# Patient Record
Sex: Male | Born: 1965 | Race: White | Hispanic: No | Marital: Married | Smoking: Current every day smoker
Health system: Southern US, Community
[De-identification: ages and names within clinical notes are randomized; demographics above are authoritative.]

## PROBLEM LIST (undated history)

## (undated) DIAGNOSIS — C8190 Hodgkin lymphoma, unspecified, unspecified site: Secondary | ICD-10-CM

## (undated) DIAGNOSIS — I447 Left bundle-branch block, unspecified: Secondary | ICD-10-CM

## (undated) DIAGNOSIS — F32A Depression, unspecified: Secondary | ICD-10-CM

## (undated) DIAGNOSIS — R0600 Dyspnea, unspecified: Secondary | ICD-10-CM

## (undated) DIAGNOSIS — M1990 Unspecified osteoarthritis, unspecified site: Secondary | ICD-10-CM

## (undated) DIAGNOSIS — Z9889 Other specified postprocedural states: Secondary | ICD-10-CM

## (undated) DIAGNOSIS — J449 Chronic obstructive pulmonary disease, unspecified: Secondary | ICD-10-CM

## (undated) DIAGNOSIS — F1121 Opioid dependence, in remission: Secondary | ICD-10-CM

## (undated) DIAGNOSIS — F419 Anxiety disorder, unspecified: Secondary | ICD-10-CM

## (undated) DIAGNOSIS — E7800 Pure hypercholesterolemia, unspecified: Secondary | ICD-10-CM

## (undated) DIAGNOSIS — I10 Essential (primary) hypertension: Secondary | ICD-10-CM

## (undated) DIAGNOSIS — J189 Pneumonia, unspecified organism: Secondary | ICD-10-CM

## (undated) DIAGNOSIS — R011 Cardiac murmur, unspecified: Secondary | ICD-10-CM

## (undated) DIAGNOSIS — Z9289 Personal history of other medical treatment: Secondary | ICD-10-CM

## (undated) DIAGNOSIS — F329 Major depressive disorder, single episode, unspecified: Secondary | ICD-10-CM

## (undated) HISTORY — DX: Other specified postprocedural states: Z98.890

## (undated) HISTORY — PX: TOTAL THYROIDECTOMY: SHX2547

## (undated) HISTORY — PX: CARDIAC VALVE REPLACEMENT: SHX585

## (undated) HISTORY — DX: Hodgkin lymphoma, unspecified, unspecified site: C81.90

## (undated) HISTORY — PX: SPLENECTOMY: SUR1306

## (undated) HISTORY — DX: Essential (primary) hypertension: I10

## (undated) HISTORY — DX: Pure hypercholesterolemia, unspecified: E78.00

## (undated) HISTORY — PX: OTHER SURGICAL HISTORY: SHX169

## (undated) HISTORY — PX: AORTIC VALVE REPLACEMENT: SHX41

## (undated) HISTORY — PX: CORONARY ARTERY BYPASS GRAFT: SHX141

## (undated) HISTORY — PX: CARDIAC CATHETERIZATION: SHX172

## (undated) HISTORY — DX: Personal history of other medical treatment: Z92.89

## (undated) HISTORY — DX: Opioid dependence, in remission: F11.21

---
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
## 2016-11-18 DIAGNOSIS — Z9889 Other specified postprocedural states: Secondary | ICD-10-CM | POA: Insufficient documentation

## 2016-11-18 HISTORY — DX: Other specified postprocedural states: Z98.890

## 2018-05-04 DIAGNOSIS — Z8571 Personal history of Hodgkin lymphoma: Secondary | ICD-10-CM

## 2018-07-29 NOTE — Progress Notes (Signed)
Synopsis: Referred in September 2019 for COPD evaluation by Reed Breech, NP  Subjective:   PATIENT ID: Henry Sheppard GENDER: male DOB: 1966-10-29, MRN: [id number]  Chief Complaint  Patient presents with  . Consult    DOE, denies chest pain or discomfort. 2L at night and prn.     Just moved from [place]. He has been diagnosed with COPD a few years ago. He was seeing a pulmonologist in [place]. He is working on quitting smoking. Plans to start chantix with his PCP. Currently established care with [facility].   He had a broken collar bone and went for stress test prior which he failed and led to CABG with Valve replacement. He was able to stop smoking for about 2 years. He had a splenectomy at age 15. Unsure if vaccinated. Does not like getting a flu shot because he always gets sick.   He feels like his activity level is pretty good. On occasion he needs oxygen and feels breathless. Climbing stairs makes this worse. Routinely has phlegm production. He has had 1 hospitalization for COPD that was 3 years ago. He has been walked recently and was found to have hypoxemia. Denies fevers, chills, NS, weight loss.   Has had PFTs in the past. Last chest imaging was in May 2019. (?[place] [facility])    Past Medical History:  Diagnosis Date  . H/O neck surgery   . H/O splenectomy   . Heart valve replaced      Family History  Problem Relation Age of Onset  . Cancer Father   . Lung disease Neg Hx      Past Surgical History:  Procedure Laterality Date  . AORTIC VALVE REPLACEMENT    . CORONARY ARTERY BYPASS GRAFT    . SPLENECTOMY      Social History   Socioeconomic History  . Marital status: Married    Spouse name: Not on file  . Number of children: Not on file  . Years of education: Not on file  . Highest education level: Not on file  Occupational History  . Not on file  Social Needs  . Financial resource strain: Not on file  . Food  insecurity:    Worry: Not on file    Inability: Not on file  . Transportation needs:    Medical: Not on file    Non-medical: Not on file  Tobacco Use  . Smoking status: Current Every Day Smoker  . Smokeless tobacco: Never Used  . Tobacco comment: 2 ppd  Substance and Sexual Activity  . Alcohol use: Not Currently  . Drug use: Not Currently  . Sexual activity: Yes  Lifestyle  . Physical activity:    Days per week: Not on file    Minutes per session: Not on file  . Stress: Not on file  Relationships  . Social connections:    Talks on phone: Not on file    Gets together: Not on file    Attends religious service: Not on file    Active member of club or organization: Not on file    Attends meetings of clubs or organizations: Not on file    Relationship status: Not on file  . Intimate partner violence:    Fear of current or ex partner: Not on file    Emotionally abused: Not on file    Physically abused: Not on file    Forced sexual activity: Not on file  Other Topics Concern  .  Not on file  Social History Narrative  . Not on file     No Known Allergies   Outpatient Medications Prior to Visit  Medication Sig Dispense Refill  . albuterol (PROVENTIL HFA;VENTOLIN HFA) 108 (90 Base) MCG/ACT inhaler Inhale into the lungs.    Marland Kitchen aspirin EC 81 MG tablet Take by mouth.    Marland Kitchen atorvastatin (LIPITOR) 40 MG tablet Take by mouth.    . carvedilol (COREG) 3.125 MG tablet Take by mouth.    . clonazePAM (KLONOPIN) 1 MG tablet Take by mouth.    . fluticasone (FLOVENT HFA) 110 MCG/ACT inhaler Inhale into the lungs.    . furosemide (LASIX) 20 MG tablet TAKE 1 TABLET BY MOUTH EVERY DAY AS NEEDED    . Glycopyrrolate (SEEBRI NEOHALER) 15.6 MCG CAPS Place 1 puff into inhaler and inhale daily as needed.    Marland Kitchen levothyroxine (SYNTHROID, LEVOTHROID) 200 MCG tablet Take by mouth.    . losartan (COZAAR) 25 MG tablet Take by mouth.    . sertraline (ZOLOFT) 100 MG tablet Take by mouth.    . SUBOXONE 8-2 MG  FILM PLACE 1 FILM UNDER TONGUE 2 TIMES DAILY  0  . Tiotropium Bromide Monohydrate 2.5 MCG/ACT AERS Inhale into the lungs.     No facility-administered medications prior to visit.     Review of Systems  Constitutional: Negative.   HENT: Positive for congestion and sore throat.   Eyes: Positive for blurred vision.  Respiratory: Positive for cough, hemoptysis, shortness of breath and wheezing.   Cardiovascular: Positive for leg swelling.  Gastrointestinal: Negative.   Genitourinary: Negative.   Musculoskeletal: Negative.   Skin: Positive for itching.  Neurological: Positive for headaches.  Endo/Heme/Allergies: Negative.   Psychiatric/Behavioral: Negative.      Objective:  Physical Exam  Constitutional: He is oriented to person, place, and time. He appears well-developed and well-nourished. No distress.  HENT:  Head: Normocephalic and atraumatic.  Mouth/Throat: Oropharynx is clear and moist.  Eyes: Pupils are equal, round, and reactive to light. Conjunctivae are normal. No scleral icterus.  Neck: Neck supple. No JVD present. No tracheal deviation present.  Cardiovascular: Normal rate, regular rhythm, normal heart sounds and intact distal pulses.  No murmur heard. Pulmonary/Chest: Effort normal and breath sounds normal. No accessory muscle usage or stridor. No tachypnea. No respiratory distress. He has no wheezes. He has no rhonchi. He has no rales.  Abdominal: Soft. Bowel sounds are normal. He exhibits no distension. There is no tenderness.  Musculoskeletal: He exhibits no edema or tenderness.  Lymphadenopathy:    He has no cervical adenopathy.  Neurological: He is alert and oriented to person, place, and time.  Skin: Skin is warm and dry. Capillary refill takes less than 2 seconds. No rash noted.  Psychiatric: He has a normal mood and affect. His behavior is normal.  Vitals reviewed.   Vitals:   07/30/18 1136  BP: 128/82  Pulse: 77  SpO2: 94%  Weight: 225 lb (102.1 kg)    Height: 5\' 9"  (1.753 m)   94% on RA BMI Readings from Last 3 Encounters:  07/30/18 33.23 kg/m   Wt Readings from Last 3 Encounters:  07/30/18 225 lb (102.1 kg)   CBC No results found for: WBC, RBC, HGB, HCT, PLT, MCV, MCH, MCHC, RDW, LYMPHSABS, MONOABS, EOSABS, BASOSABS  No available labs to review  Chest Imaging: Most recent imaging was completed and May 2019 in [facility] [place] [facility] [facility]. Will request medical records.  Pulmonary Functions Testing Results: No  results found for: FEV1, FVC, FEV1FVC, TLC, DLCO  None available to review.  Patient is unsure when his last PFTs were completed.  FeNO: None  Pathology: None  Echocardiogram: None  Heart Catheterization: None to review has had this in the.    Assessment & Plan:   Chronic obstructive pulmonary disease, unspecified COPD type ([place]) - Plan: Pulmonary function test  Current smoker  Hx of Hodgkin's lymphoma  History of coronary artery bypass graft  History of thyroidectomy  History of splenectomy  Discussion:  This is a 52 year old gentleman with multiple medical problems.  History of coronary artery bypass grafting as well as valve replacement, aortic valve.  History of Hodgkin's lymphoma at the age of 17 status post chemo and chest radiation and neck radiation.  Since then has had thyroidectomy as well.  He is a longtime smoker, current smoker 2ppd.  We will obtain full PFTs Start samples of trilogy inhaler. Will send prescription to pharmacy. Discontinue tiotropium, glycopyrrolate, fluticasone  Continue albuterol inhaler as needed for shortness of breath and wheezing. Encourage smoking cessation.  I am glad you currently have a plan to work with this with your primary care provider.  Please let us know if we can be of any help. Walk today in the office to see if you qualify for continued oxygen use.  Return to clinic in 4 months or sooner if symptoms worsen.    Current Outpatient Medications:  .   albuterol (PROVENTIL HFA;VENTOLIN HFA) 108 (90 Base) MCG/ACT inhaler, Inhale into the lungs., Disp: , Rfl:  .  aspirin EC 81 MG tablet, Take by mouth., Disp: , Rfl:  .  atorvastatin (LIPITOR) 40 MG tablet, Take by mouth., Disp: , Rfl:  .  carvedilol (COREG) 3.125 MG tablet, Take by mouth., Disp: , Rfl:  .  clonazePAM (KLONOPIN) 1 MG tablet, Take by mouth., Disp: , Rfl:  .  fluticasone (FLOVENT HFA) 110 MCG/ACT inhaler, Inhale into the lungs., Disp: , Rfl:  .  furosemide (LASIX) 20 MG tablet, TAKE 1 TABLET BY MOUTH EVERY DAY AS NEEDED, Disp: , Rfl:  .  Glycopyrrolate (SEEBRI NEOHALER) 15.6 MCG CAPS, Place 1 puff into inhaler and inhale daily as needed., Disp: , Rfl:  .  levothyroxine (SYNTHROID, LEVOTHROID) 200 MCG tablet, Take by mouth., Disp: , Rfl:  .  losartan (COZAAR) 25 MG tablet, Take by mouth., Disp: , Rfl:  .  sertraline (ZOLOFT) 100 MG tablet, Take by mouth., Disp: , Rfl:  .  SUBOXONE 8-2 MG FILM, PLACE 1 FILM UNDER TONGUE 2 TIMES DAILY, Disp: , Rfl: 0 .  Tiotropium Bromide Monohydrate 2.5 MCG/ACT AERS, Inhale into the lungs., Disp: , Rfl:    Garner Nash, DO [facility] 07/30/2018 12:25 PM

## 2018-07-30 ENCOUNTER — Encounter: Payer: Self-pay | Admitting: Pulmonary Disease

## 2018-07-30 ENCOUNTER — Ambulatory Visit (INDEPENDENT_AMBULATORY_CARE_PROVIDER_SITE_OTHER): Payer: Medicare Other | Admitting: Pulmonary Disease

## 2018-07-30 VITALS — BP 128/82 | HR 77 | Ht 69.0 in | Wt 225.0 lb

## 2018-07-30 DIAGNOSIS — Z8571 Personal history of Hodgkin lymphoma: Secondary | ICD-10-CM | POA: Diagnosis not present

## 2018-07-30 DIAGNOSIS — E890 Postprocedural hypothyroidism: Secondary | ICD-10-CM

## 2018-07-30 DIAGNOSIS — J449 Chronic obstructive pulmonary disease, unspecified: Secondary | ICD-10-CM

## 2018-07-30 DIAGNOSIS — F17200 Nicotine dependence, unspecified, uncomplicated: Secondary | ICD-10-CM

## 2018-07-30 DIAGNOSIS — E7800 Pure hypercholesterolemia, unspecified: Secondary | ICD-10-CM | POA: Insufficient documentation

## 2018-07-30 DIAGNOSIS — Z9889 Other specified postprocedural states: Secondary | ICD-10-CM

## 2018-07-30 DIAGNOSIS — I5020 Unspecified systolic (congestive) heart failure: Secondary | ICD-10-CM | POA: Insufficient documentation

## 2018-07-30 DIAGNOSIS — Z9081 Acquired absence of spleen: Secondary | ICD-10-CM

## 2018-07-30 DIAGNOSIS — I2729 Other secondary pulmonary hypertension: Secondary | ICD-10-CM | POA: Insufficient documentation

## 2018-07-30 DIAGNOSIS — Z951 Presence of aortocoronary bypass graft: Secondary | ICD-10-CM | POA: Diagnosis not present

## 2018-07-30 DIAGNOSIS — Z9009 Acquired absence of other part of head and neck: Secondary | ICD-10-CM

## 2018-07-30 DIAGNOSIS — I509 Heart failure, unspecified: Secondary | ICD-10-CM

## 2018-07-30 MED ORDER — FLUTICASONE-UMECLIDIN-VILANT 100-62.5-25 MCG/INH IN AEPB: 1.0000 | INHALATION_SPRAY | Freq: Every day | RESPIRATORY_TRACT | 3 refills | Status: DC

## 2018-07-30 NOTE — Addendum Note (Signed)
Addended by: Vivia Ewing on: 07/30/2018 12:30 PM   Modules accepted: Orders

## 2018-07-30 NOTE — Patient Instructions (Addendum)
We will obtain full PFTs Start samples of trilogy inhaler. Will send prescription to pharmacy. Discontinue tiotropium, glycopyrrolate, fluticasone  Continue albuterol inhaler as needed for shortness of breath and wheezing. Encourage smoking cessation.  I am glad you currently have a plan to work with this with your primary care provider.  Please let us know if we can be of any help. Return to clinic in 4 months or sooner if symptoms worsen.

## 2018-07-31 ENCOUNTER — Telehealth: Payer: Self-pay | Admitting: Pulmonary Disease

## 2018-07-31 NOTE — Telephone Encounter (Signed)
Medication name and strength: Trelegy Ellipta 100-6.5-25 Provider: [facility] Pharmacy: [facility] Patient insurance ID:  Phone: [phone] Fax: [phone]  Was the PA started on CMM?  no If yes, please enter the Key:  Timeframe for approval/denial: approval   [place] [phone]  Approval Number: [id number]  Exp;07/26/2019  Pharmacy is aware of approval. Nothing more needed at this time.

## 2018-10-07 DIAGNOSIS — Z9289 Personal history of other medical treatment: Secondary | ICD-10-CM

## 2018-10-07 HISTORY — DX: Personal history of other medical treatment: Z92.89

## 2018-12-21 DIAGNOSIS — Z952 Presence of prosthetic heart valve: Secondary | ICD-10-CM

## 2018-12-21 DIAGNOSIS — R0609 Other forms of dyspnea: Secondary | ICD-10-CM

## 2018-12-21 DIAGNOSIS — F1120 Opioid dependence, uncomplicated: Secondary | ICD-10-CM | POA: Diagnosis present

## 2018-12-21 DIAGNOSIS — I10 Essential (primary) hypertension: Secondary | ICD-10-CM | POA: Diagnosis present

## 2018-12-21 NOTE — Progress Notes (Signed)
Labs 12/14/2018: Glucose 111. BUN/Cr 18/0.67. eGFR normal. Na/K 138/4.7 H/H 14/42. MCV 89. Platelets 291.

## 2018-12-21 NOTE — H&P (Signed)
Henry Sheppard 12/14/2018 10:45 AM Location: [facility] Patient #: [id number] DOB: 06/01/1966 Married / Language: English / Race: White Male   History of Present Illness ( J.  MD; 12/14/2018 11:19 AM) Patient words: 3 month f/u for sob and echo results; Last OV 09/14/2018 pt still has swelling in ankles and feet.  The patient is a 52 year old male, accompanied by his mother (Barabara McLaren) and wife (Rachel) during the visit, who presents for a Follow-up for Shortness of breath. 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  There is confusing informaiton regarding coronary artery disease on this patient. Office visits form [place] mention 3 V CABG in surgical history. However, cath report from 2016 after bioprosthetic valve placement reports nonobastructive CAD with no mention of bypass grafts.  Patient is here with his wife, Rachel and mother, Barbara today. Patient has had worsening exertional dyspnea and leg swelling. He is barely able to walk from car to the office today, without shortness of breath.  Additional reasons for visit:  Follow-up for Aortic valve replacement    Problem List/Past Medical (Shayna Leathers; 12/14/2018 10:43 AM) Laboratory examination (Z01.89)  Labs 09/17/2018: H/H 16/48. MCV 90. Platelets 257. Glucose 103. BUN/Cr 17/0.69. eGFR normal. Na/K 140/4.9. Rest of the CMP nromal. Chol 214, TG 119, HDL 43, LDL 147 Hodgkin's Disease  Coronary arteriosclerosis (I25.10)  Hyperlipidemia, group A (E78.00)  Opioid dependence in remission (F11.21)  S/P AVR (aortic valve replacement) and aortoplasty (Z95.2)  Hypertension, benign (I10)  EKG 06/10/2018: Sinus rhythm 73 bpm. Left bundle branch block. Exertional  dyspnea (R06.09)   Allergies (Shayna Picardi; 12/14/2018 10:43 AM) No Known Drug Allergies [06/10/2018]:  Family History (Shayna Bensch; 12/14/2018 10:43 AM) Mother  In poor health. No heart issues; Father  unsure Brother 3  2 younger; 1 older; 1 passed; No heart issues  Social History (Shayna Morina; 12/14/2018 10:43 AM) Current tobacco use  Current every day smoker. smoking since 1985- smokes 1 1/2 packs daily Alcohol Use  Occasional alcohol use. medical marijuana patient Marital status  Married. Living Situation  Lives with spouse. Number of Children  3.  Past Surgical History (Shayna Bralley; 12/14/2018 10:43 AM) Lymph angiogram [1984]: Spleen  removed; Thyroidectomy; Total  Collar bone  R  Medication History (Shayna Chaudoin; 12/14/2018 10:51 AM) Albuterol Sulfate HFA (108 (90 Base)MCG/ACT Aerosol Soln, Inhalation daily) Active. Aspirin (81MG Capsule, 1 Oral daily) Active. Atorvastatin Calcium (40MG Tablet, 1 Oral daily) Active. Buprenorphine HCl-Naloxone HCl (8-2MG Film, Sublingual two times daily) Active. Carvedilol (6.25MG Tablet, 1 Oral daily) Active. clonazePAM (1MG Tablet, 1 Oral daily) Active. Cyclobenzaprine HCl (10MG Tablet, 1 Oral daily) Active. Docusate Sodium (100MG Capsule, Oral as needed) Active. Furosemide (20MG Tablet, Oral Once daily) Active. Levothyroxine Sodium (200MCG Tablet, 1 Oral daily) Active. Oxygen-Helium (20-80% Gas, Inhalation as needed) Active. Sertraline HCl (100MG Tablet, 1 Oral daily) Active. Spiriva Respimat (2.5MCG/ACT Aerosol Soln, Inhalation daily) Active. Medications Reconciled (verbally)  Diagnostic Studies History (Shayna Bober; 12/14/2018 10:45 AM) Colonoscopy [2018]: Normal. Endoscopy [2018]: Echocardiogram  10/07/2018 1. Left ventricle cavity is normal in size. Moderate concentric hypertrophy of the left ventricle. Mildly reduced syst. function Visual EF is 45-50%. Abnormal septal wall motion due  to post-operative valve. Indeterminate diastolic filling pattern. 2. Left atrial cavity is moderately dilated. 3. Bio prosthetic aortic valve. Moderate to severe aortic valve stenosis   of bio prosthetic aortic valve with peak gradient 52, mean gradient 30 mm of Hg. Calculated valve area is 0.66 cm2 4. Trace mitral regurgitation. Moderate to severe calcification of the mitral valve annulus. 5. Mild tricuspid regurgitation. Mild pulmonary hypertension with approx. PA syst. pressure of 32 mm of Hg. 6. IVC is dilated with respiratory variation, suggests elevated RA pressure    Review of Systems ( J. , MD; 12/14/2018 11:26 AM) General Not Present- Appetite Loss and Weight Gain. Respiratory Not Present- Chronic Cough and Wakes up from Sleep Wheezing or Short of Breath. Cardiovascular Present- Shortness of Breath. Not Present- Chest Pain, Fainting and Palpitations. Gastrointestinal Not Present- Black, Tarry Stool and Difficulty Swallowing. Musculoskeletal Not Present- Decreased Range of Motion and Muscle Atrophy. Neurological Not Present- Attention Deficit. Psychiatric Not Present- Personality Changes and Suicidal Ideation. Endocrine Not Present- Cold Intolerance and Heat Intolerance. Hematology Not Present- Abnormal Bleeding. All other systems negative  Vitals (Shayna Liles; 12/14/2018 10:53 AM) 12/14/2018 10:47 AM Weight: 244.13 lb Height: 72in Body Surface Area: 2.32 m Body Mass Index: 33.11 kg/m  Pulse: 71 (Regular)  P.OX: 95% (Room air) BP: 148/74 (Sitting, Right Arm, Standard)       Physical Exam ( J.  MD; 12/14/2018 11:17 AM) General Mental Status-Alert. General Appearance-Cooperative and Appears stated age. Build & Nutrition-Moderately built.  Head and Neck Thyroid Gland Characteristics - normal size and consistency and no palpable nodules. Note: Indiscrete right submandibular lymph node   Chest and Lung Exam Chest  and lung exam reveals -quiet, even and easy respiratory effort with no use of accessory muscles, non-tender and on auscultation, normal breath sounds, no adventitious sounds.  Cardiovascular Cardiovascular examination reveals -carotid auscultation reveals no bruits, abdominal aorta auscultation reveals no bruits and no prominent pulsation and femoral artery auscultation bilaterally reveals normal pulses, no bruits, no thrills. Auscultation Rhythm - Regular. Murmurs & Other Heart Sounds: Murmur - Location - Aortic Area. Timing - Early systolic. Grade - III/VI. Character - Crescendo/Decrescendo. Note: Sternotomy scar   Abdomen Palpation/Percussion Normal exam - Non Tender and No hepatosplenomegaly.  Peripheral Vascular Lower Extremity Palpation - Edema - Bilateral - 1+ Pitting edema. Dorsalis pedis pulse - Bilateral - 2+. Posterior tibial pulse - Bilateral - 1+. Carotid arteries - Bilateral-Soft Bruit.  Neurologic Neurologic evaluation reveals -alert and oriented x 3 with no impairment of recent or remote memory. Motor-Grossly intact without any focal deficits.  Musculoskeletal Global Assessment Right Lower Extremity - no deformities, masses or tenderness, no known fractures. Head and Neck - Note: Deformities of neck, prior clavicle fracture.    Assessment & Plan ( J.  MD; 12/14/2018 11:26 AM) Exertional dyspnea (R06.09) Hypertension, benign (I10) Story: EKG 06/10/2018: Sinus rhythm 73 bpm. Left bundle branch block. 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  I will obtain operative note to confirm that the patient   does not have CABG. I will then perform a TEE and right and left heart cath with coronary angiography, anticipating either redo surgery or valve-in-valve TAVR. I will then have him see Dr. Cyndia Bent.  No changes made to his medications today.  I suspect he may have at least moderate stenosis of his by prosthetic aortic valve. Etiology of his initial aortic stenosis remains unclear. I suspect he may have had a cuspid aortic valve that led to premature aortic stenosis. I will obtain operative report from 2011. I recommend repeat transthoracic echocardiogram. He may need TEE for further evaluation. He is unwilling to undergo redo aortic valve replacement surgery. Should he have severe back for severe divorce process, valve and valve procedure may be an option. For now, I recommend him to use Lasix 20 mg every day. Continue aspirin 81 mg daily.  History of Hodgkin's lymphoma: Patient reportedly has established care with an oncologist, but does not recollect the name. Reportedly in remission.  COPD: Follow up with pulmonology.  H/o opioid dependence: Follow up with [facility] clinic.  Cc Thermon Leyland, NP Cc Gilford Raid, MD  Signed electronically by Nigel Mormon, MD (12/14/2018 11:26 AM)

## 2018-12-22 ENCOUNTER — Ambulatory Visit (HOSPITAL_COMMUNITY)
Admission: RE | Admit: 2018-12-22 | Discharge: 2018-12-22 | Disposition: A | Discharge status: Home / Self Care | Payer: Medicare Other | Attending: Cardiology | Admitting: Cardiology

## 2018-12-22 ENCOUNTER — Encounter (HOSPITAL_COMMUNITY)
Admission: RE | Disposition: A | Discharge status: Home / Self Care | Payer: Self-pay | Source: Home / Self Care | Attending: Cardiology

## 2018-12-22 ENCOUNTER — Other Ambulatory Visit: Payer: Self-pay

## 2018-12-22 ENCOUNTER — Encounter (HOSPITAL_COMMUNITY): Payer: Self-pay | Admitting: *Deleted

## 2018-12-22 ENCOUNTER — Ambulatory Visit (HOSPITAL_COMMUNITY): Payer: Medicare Other

## 2018-12-22 DIAGNOSIS — I350 Nonrheumatic aortic (valve) stenosis: Secondary | ICD-10-CM

## 2018-12-22 DIAGNOSIS — I10 Essential (primary) hypertension: Secondary | ICD-10-CM | POA: Diagnosis not present

## 2018-12-22 DIAGNOSIS — Z8571 Personal history of Hodgkin lymphoma: Secondary | ICD-10-CM | POA: Insufficient documentation

## 2018-12-22 DIAGNOSIS — T82857A Stenosis of cardiac prosthetic devices, implants and grafts, initial encounter: Secondary | ICD-10-CM | POA: Diagnosis present

## 2018-12-22 DIAGNOSIS — Z79890 Hormone replacement therapy (postmenopausal): Secondary | ICD-10-CM | POA: Insufficient documentation

## 2018-12-22 DIAGNOSIS — Z9981 Dependence on supplemental oxygen: Secondary | ICD-10-CM | POA: Insufficient documentation

## 2018-12-22 DIAGNOSIS — I272 Pulmonary hypertension, unspecified: Secondary | ICD-10-CM | POA: Insufficient documentation

## 2018-12-22 DIAGNOSIS — Z952 Presence of prosthetic heart valve: Secondary | ICD-10-CM | POA: Diagnosis not present

## 2018-12-22 DIAGNOSIS — Z87891 Personal history of nicotine dependence: Secondary | ICD-10-CM | POA: Diagnosis not present

## 2018-12-22 DIAGNOSIS — R0609 Other forms of dyspnea: Secondary | ICD-10-CM | POA: Diagnosis present

## 2018-12-22 DIAGNOSIS — I082 Rheumatic disorders of both aortic and tricuspid valves: Secondary | ICD-10-CM | POA: Insufficient documentation

## 2018-12-22 DIAGNOSIS — F1120 Opioid dependence, uncomplicated: Secondary | ICD-10-CM | POA: Diagnosis present

## 2018-12-22 DIAGNOSIS — E7800 Pure hypercholesterolemia, unspecified: Secondary | ICD-10-CM | POA: Diagnosis not present

## 2018-12-22 DIAGNOSIS — I2510 Atherosclerotic heart disease of native coronary artery without angina pectoris: Secondary | ICD-10-CM | POA: Insufficient documentation

## 2018-12-22 DIAGNOSIS — Z7982 Long term (current) use of aspirin: Secondary | ICD-10-CM | POA: Insufficient documentation

## 2018-12-22 HISTORY — PX: TEE WITHOUT CARDIOVERSION: SHX5443

## 2018-12-22 HISTORY — PX: RIGHT/LEFT HEART CATH AND CORONARY ANGIOGRAPHY: CATH118266

## 2018-12-22 LAB — POCT I-STAT 7, (LYTES, BLD GAS, ICA,H+H)
ACID-BASE EXCESS: 2 mmol/L (ref 0.0–2.0)
Bicarbonate: 28 mmol/L (ref 20.0–28.0)
Calcium, Ion: 1.12 mmol/L — ABNORMAL LOW (ref 1.15–1.40)
HEMATOCRIT: 40 % (ref 39.0–52.0)
Hemoglobin: 13.6 g/dL (ref 13.0–17.0)
O2 Saturation: 92 %
Potassium: 4 mmol/L (ref 3.5–5.1)
SODIUM: 139 mmol/L (ref 135–145)
TCO2: 29 mmol/L (ref 22–32)
pCO2 arterial: 47.8 mmHg (ref 32.0–48.0)
pH, Arterial: 7.376 (ref 7.350–7.450)
pO2, Arterial: 66 mmHg — ABNORMAL LOW (ref 83.0–108.0)

## 2018-12-22 LAB — POCT I-STAT EG7
Acid-Base Excess: 3 mmol/L — ABNORMAL HIGH (ref 0.0–2.0)
Bicarbonate: 30.7 mmol/L — ABNORMAL HIGH (ref 20.0–28.0)
Calcium, Ion: 1.19 mmol/L (ref 1.15–1.40)
HCT: 41 % (ref 39.0–52.0)
Hemoglobin: 13.9 g/dL (ref 13.0–17.0)
O2 SAT: 65 %
Potassium: 4.1 mmol/L (ref 3.5–5.1)
Sodium: 139 mmol/L (ref 135–145)
TCO2: 32 mmol/L (ref 22–32)
pCO2, Ven: 57.1 mmHg (ref 44.0–60.0)
pH, Ven: 7.339 (ref 7.250–7.430)
pO2, Ven: 37 mmHg (ref 32.0–45.0)

## 2018-12-22 SURGERY — ECHOCARDIOGRAM, TRANSESOPHAGEAL
Anesthesia: Moderate Sedation

## 2018-12-22 SURGERY — RIGHT/LEFT HEART CATH AND CORONARY ANGIOGRAPHY
Anesthesia: LOCAL

## 2018-12-22 MED ORDER — SODIUM CHLORIDE 0.9 % WEIGHT BASED INFUSION: 1.0000 mL/kg/h | INTRAVENOUS | Status: DC

## 2018-12-22 MED ORDER — FENTANYL CITRATE (PF) 100 MCG/2ML IJ SOLN
INTRAMUSCULAR | Status: DC | PRN
  Administered 2018-12-22 (×2): 25 ug via INTRAVENOUS

## 2018-12-22 MED ORDER — LIDOCAINE HCL (PF) 1 % IJ SOLN
INTRAMUSCULAR | Status: DC | PRN
  Administered 2018-12-22 (×2): 2 mL via INTRADERMAL

## 2018-12-22 MED ORDER — HEPARIN (PORCINE) IN NACL 1000-0.9 UT/500ML-% IV SOLN
INTRAVENOUS | Status: DC | PRN
  Administered 2018-12-22 (×2): 500 mL

## 2018-12-22 MED ORDER — VERAPAMIL HCL 2.5 MG/ML IV SOLN
INTRAVENOUS | Status: AC
  Filled 2018-12-22: qty 2

## 2018-12-22 MED ORDER — FUROSEMIDE 40 MG PO TABS: 40.0000 mg | ORAL_TABLET | Freq: Every day | ORAL | 2 refills | Status: DC

## 2018-12-22 MED ORDER — SODIUM CHLORIDE 0.9% FLUSH: 3.0000 mL | Freq: Two times a day (BID) | INTRAVENOUS | Status: DC

## 2018-12-22 MED ORDER — HEPARIN SODIUM (PORCINE) 1000 UNIT/ML IJ SOLN
INTRAMUSCULAR | Status: AC
  Filled 2018-12-22: qty 1

## 2018-12-22 MED ORDER — MIDAZOLAM HCL 5 MG/5ML IJ SOLN
INTRAMUSCULAR | Status: DC | PRN
  Administered 2018-12-22 (×2): 1 mg via INTRAVENOUS

## 2018-12-22 MED ORDER — BUTAMBEN-TETRACAINE-BENZOCAINE 2-2-14 % EX AERO
INHALATION_SPRAY | CUTANEOUS | Status: AC
  Filled 2018-12-22: qty 20

## 2018-12-22 MED ORDER — SODIUM CHLORIDE 0.9% FLUSH: 3.0000 mL | INTRAVENOUS | Status: DC | PRN

## 2018-12-22 MED ORDER — ONDANSETRON HCL 4 MG/2ML IJ SOLN: 4.0000 mg | Freq: Four times a day (QID) | INTRAMUSCULAR | Status: DC | PRN

## 2018-12-22 MED ORDER — SODIUM CHLORIDE 0.9 % IV SOLN: 250.0000 mL | INTRAVENOUS | Status: DC | PRN

## 2018-12-22 MED ORDER — LIDOCAINE HCL (PF) 1 % IJ SOLN
INTRAMUSCULAR | Status: AC
  Filled 2018-12-22: qty 30

## 2018-12-22 MED ORDER — VERAPAMIL HCL 2.5 MG/ML IV SOLN
INTRAVENOUS | Status: DC | PRN
  Administered 2018-12-22: 5 mL via INTRA_ARTERIAL

## 2018-12-22 MED ORDER — SODIUM CHLORIDE 0.9 % IV SOLN: INTRAVENOUS | Status: DC

## 2018-12-22 MED ORDER — ASPIRIN 81 MG PO CHEW
81.0000 mg | CHEWABLE_TABLET | ORAL | Status: AC
  Administered 2018-12-22: 81 mg via ORAL
  Filled 2018-12-22: qty 1

## 2018-12-22 MED ORDER — IOHEXOL 350 MG/ML SOLN
INTRAVENOUS | Status: DC | PRN
  Administered 2018-12-22: 60 mL via INTRA_ARTERIAL

## 2018-12-22 MED ORDER — MIDAZOLAM HCL (PF) 5 MG/ML IJ SOLN
INTRAMUSCULAR | Status: AC
  Filled 2018-12-22: qty 2

## 2018-12-22 MED ORDER — BUTAMBEN-TETRACAINE-BENZOCAINE 2-2-14 % EX AERO
INHALATION_SPRAY | CUTANEOUS | Status: DC | PRN
  Administered 2018-12-22: 2 via TOPICAL

## 2018-12-22 MED ORDER — FUROSEMIDE 10 MG/ML IJ SOLN: 40.0000 mg | Freq: Once | INTRAMUSCULAR | Status: DC

## 2018-12-22 MED ORDER — HEPARIN (PORCINE) IN NACL 1000-0.9 UT/500ML-% IV SOLN
INTRAVENOUS | Status: AC
  Filled 2018-12-22: qty 1000

## 2018-12-22 MED ORDER — FENTANYL CITRATE (PF) 100 MCG/2ML IJ SOLN
INTRAMUSCULAR | Status: AC
  Filled 2018-12-22: qty 2

## 2018-12-22 MED ORDER — SODIUM CHLORIDE 0.9 % WEIGHT BASED INFUSION
3.0000 mL/kg/h | INTRAVENOUS | Status: AC
  Administered 2018-12-22: 3 mL/kg/h via INTRAVENOUS

## 2018-12-22 MED ORDER — HEPARIN SODIUM (PORCINE) 1000 UNIT/ML IJ SOLN
INTRAMUSCULAR | Status: DC | PRN
  Administered 2018-12-22: 7000 [IU] via INTRAVENOUS

## 2018-12-22 MED ORDER — ACETAMINOPHEN 325 MG PO TABS: 650.0000 mg | ORAL_TABLET | ORAL | Status: DC | PRN

## 2018-12-22 SURGICAL SUPPLY — 13 items
CATH BALLN WEDGE 5F 110CM (CATHETERS) ×2 IMPLANT
CATH INFINITI 5 FR JL3.5 (CATHETERS) ×2 IMPLANT
CATH INFINITI JR4 5F (CATHETERS) ×2 IMPLANT
CATH LANGSTON DUAL LUM PIG 6FR (CATHETERS) ×2 IMPLANT
DEVICE RAD COMP TR BAND LRG (VASCULAR PRODUCTS) ×2 IMPLANT
GLIDESHEATH SLEND A-KIT 6F 22G (SHEATH) ×2 IMPLANT
GUIDEWIRE INQWIRE 1.5J.035X260 (WIRE) ×1 IMPLANT
INQWIRE 1.5J .035X260CM (WIRE) ×2
KIT HEART LEFT (KITS) ×2 IMPLANT
PACK CARDIAC CATHETERIZATION (CUSTOM PROCEDURE TRAY) ×2 IMPLANT
SHEATH GLIDE SLENDER 4/5FR (SHEATH) ×2 IMPLANT
TRANSDUCER W/STOPCOCK (MISCELLANEOUS) ×2 IMPLANT
TUBING CIL FLEX 10 FLL-RA (TUBING) ×2 IMPLANT

## 2018-12-22 NOTE — Progress Notes (Signed)
  Echocardiogram Echocardiogram Transesophageal has been performed.  Henry Sheppard 12/22/2018, 9:11 AM

## 2018-12-22 NOTE — Discharge Instructions (Signed)
Radial Site Care ° °This sheet gives you information about how to care for yourself after your procedure. Your health care provider may also give you more specific instructions. If you have problems or questions, contact your health care provider. °What can I expect after the procedure? °After the procedure, it is common to have: °· Bruising and tenderness at the catheter insertion area. °Follow these instructions at home: °Medicines °· Take over-the-counter and prescription medicines only as told by your health care provider. °Insertion site care °· Follow instructions from your health care provider about how to take care of your insertion site. Make sure you: °? Wash your hands with soap and water before you change your bandage (dressing). If soap and water are not available, use hand sanitizer. °? Change your dressing as told by your health care provider. °? Leave stitches (sutures), skin glue, or adhesive strips in place. These skin closures may need to stay in place for 2 weeks or longer. If adhesive strip edges start to loosen and curl up, you may trim the loose edges. Do not remove adhesive strips completely unless your health care provider tells you to do that. °· Check your insertion site every day for signs of infection. Check for: °? Redness, swelling, or pain. °? Fluid or blood. °? Pus or a bad smell. °? Warmth. °· Do not take baths, swim, or use a hot tub until your health care provider approves. °· You may shower 24-48 hours after the procedure, or as directed by your health care provider. °? Remove the dressing and gently wash the site with plain soap and water. °? Pat the area dry with a clean towel. °? Do not rub the site. That could cause bleeding. °· Do not apply powder or lotion to the site. °Activity ° °· For 24 hours after the procedure, or as directed by your health care provider: °? Do not flex or bend the affected arm. °? Do not push or pull heavy objects with the affected arm. °? Do not  drive yourself home from the hospital or clinic. You may drive 24 hours after the procedure unless your health care provider tells you not to. °? Do not operate machinery or power tools. °· Do not lift anything that is heavier than 10 lb (4.5 kg), or the limit that you are told, until your health care provider says that it is safe. °· Ask your health care provider when it is okay to: °? Return to work or school. °? Resume usual physical activities or sports. °? Resume sexual activity. °General instructions °· If the catheter site starts to bleed, raise your arm and put firm pressure on the site. If the bleeding does not stop, get help right away. This is a medical emergency. °· If you went home on the same day as your procedure, a responsible adult should be with you for the first 24 hours after you arrive home. °· Keep all follow-up visits as told by your health care provider. This is important. °Contact a health care provider if: °· You have a fever. °· You have redness, swelling, or yellow drainage around your insertion site. °Get help right away if: °· You have unusual pain at the radial site. °· The catheter insertion area swells very fast. °· The insertion area is bleeding, and the bleeding does not stop when you hold steady pressure on the area. °· Your arm or hand becomes pale, cool, tingly, or numb. °These symptoms may represent a serious problem   that is an emergency. Do not wait to see if the symptoms will go away. Get medical help right away. Call your local emergency services (911 in the [place] Do not drive yourself to the hospital. °Summary °· After the procedure, it is common to have bruising and tenderness at the site. °· Follow instructions from your health care provider about how to take care of your radial site wound. Check the wound every day for signs of infection. °· Do not lift anything that is heavier than 10 lb (4.5 kg), or the limit that you are told, until your health care provider says  that it is safe. °This information is not intended to replace advice given to you by your health care provider. Make sure you discuss any questions you have with your health care provider. °Document Released: 12/07/2010 Document Revised: 12/10/2017 Document Reviewed: 12/10/2017 °Elsevier Interactive Patient Education © 2019 [employer] ° °

## 2018-12-22 NOTE — Interval H&P Note (Signed)
History and Physical Interval Note:  12/22/2018 8:04 AM  Henry Sheppard  has presented today for surgery, with the diagnosis of prosthetic valve stenosis  The various methods of treatment have been discussed with the patient and family. After consideration of risks, benefits and other options for treatment, the patient has consented to  Procedure(s): TRANSESOPHAGEAL ECHOCARDIOGRAM (TEE) (N/A) as a surgical intervention .  The patient's history has been reviewed, patient examined, no change in status, stable for surgery.  I have reviewed the patient's chart and labs.  Questions were answered to the patient's satisfaction.     [place]

## 2018-12-22 NOTE — CV Procedure (Signed)
TEE: Under moderate sedation, TEE was performed without complications: LV: Normal. Normal EF. RV: Normal LA: Normal. Left atrial appendage: Normal without thrombus. Normal function. Inter atrial septum is intact without defect. RA: Normal  MV: Normal Mild MR. 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 No AI. PV: Normal. Trace PI.  Thoracic and ascending aorta: Normal without significant plaque or atheromatous changes.  Conscious sedation protocol was followed, I personally administered conscious sedation and monitored the patient. Patient received 2 milligrams of Versed and 50 . Patient tolerated the procedure well and there was no complication from conscious sedation. Time administered was 30 min and procedure ended at 8:45 AM.

## 2018-12-22 NOTE — Progress Notes (Signed)
Endo recovery complete following TEE. Patient remains in endo at this time awaiting Cath lab.

## 2018-12-22 NOTE — Interval H&P Note (Signed)
History and Physical Interval Note:  12/22/2018 11:43 AM  Henry Sheppard  has presented today for surgery, with the diagnosis of stenosis  The various methods of treatment have been discussed with the patient and family. After consideration of risks, benefits and other options for treatment, the patient has consented to  Procedure(s): RIGHT/LEFT HEART CATH AND CORONARY ANGIOGRAPHY (N/A) as a surgical intervention .  The patient's history has been reviewed, patient examined, no change in status, stable for surgery.  I have reviewed the patient's chart and labs.  Questions were answered to the patient's satisfaction.    2012 Appropriate Use Criteria for Diagnostic Catheterization Home / Select Test of Interest Indication for RHC Valvular Disease Valvular Disease (Right and Left Heart Catheterization or Right Heart Catheterization Alone With or  Valvular Disease  (Right and Left Heart Catheterization or Right Heart Catheterization  Alone With or Without Left Ventriculography and Coronary Angiography) Link Here: [email or web address] Indication:  1. Preoperative assessment before valvular surgery A (7) Indication: 70; Score 7     Esker Dever J Caileigh Canche

## 2018-12-23 ENCOUNTER — Encounter (HOSPITAL_COMMUNITY): Payer: Self-pay | Admitting: Cardiology

## 2018-12-28 ENCOUNTER — Other Ambulatory Visit: Payer: Self-pay | Admitting: *Deleted

## 2018-12-28 ENCOUNTER — Institutional Professional Consult (permissible substitution) (INDEPENDENT_AMBULATORY_CARE_PROVIDER_SITE_OTHER): Payer: Medicare Other | Admitting: Surgery

## 2018-12-28 ENCOUNTER — Encounter: Payer: Self-pay | Admitting: *Deleted

## 2018-12-28 ENCOUNTER — Encounter: Payer: Self-pay | Admitting: Surgery

## 2018-12-28 ENCOUNTER — Other Ambulatory Visit: Payer: Self-pay

## 2018-12-28 VITALS — BP 116/70 | HR 79 | Resp 18 | Ht 70.0 in | Wt 245.0 lb

## 2018-12-28 DIAGNOSIS — C8190 Hodgkin lymphoma, unspecified, unspecified site: Secondary | ICD-10-CM | POA: Insufficient documentation

## 2018-12-28 DIAGNOSIS — F1121 Opioid dependence, in remission: Secondary | ICD-10-CM | POA: Insufficient documentation

## 2018-12-28 DIAGNOSIS — E7800 Pure hypercholesterolemia, unspecified: Secondary | ICD-10-CM | POA: Insufficient documentation

## 2018-12-28 DIAGNOSIS — T82857A Stenosis of cardiac prosthetic devices, implants and grafts, initial encounter: Secondary | ICD-10-CM | POA: Diagnosis not present

## 2018-12-28 DIAGNOSIS — I10 Essential (primary) hypertension: Secondary | ICD-10-CM | POA: Insufficient documentation

## 2018-12-28 DIAGNOSIS — I2510 Atherosclerotic heart disease of native coronary artery without angina pectoris: Secondary | ICD-10-CM

## 2018-12-29 NOTE — Progress Notes (Signed)
Cardiothoracic Surgery Consultation  PCP is [place], [facility] Referring Provider is Patwardhan, Reynold Bowen, MD  Chief Complaint  Patient presents with  . Coronary Artery Disease    Surgical eval, Cardiac Cath and ECHO 12/22/18, PFT's 07/30/18    HPI:  The patient is a 53 year old gentleman with history of hypertension, hyperlipidemia, remote history of Hodgkin's lymphoma status post splenectomy and chest radiation at age 89, aortic stenosis status post aortic valve replacement in 2011 in [place] using a 21 mm Edwards pericardial valve, ongoing 2 pack/day cigarette smoking for many years, frequent marijuana smoking, and degenerative spine disease with chronic narcotic dependence currently on Suboxone who presents with progressive exertional shortness of breath and leg swelling.  He had a TEE done on 12/22/2018 which showed a well-seated Edwards bioprosthetic valve with normal leaflet excursion and no significant thickening or calcification.  The mean gradient across the prosthesis was 56 mmHg.  The valve area was measured at 1.58 cm.  His most recent hemoglobin on 12/14/2018 was 14.1.  An echocardiogram on 10/07/2018 reportedly showed a mean gradient of 30 mmHg with a peak gradient of 52 mmHg and a calculated valve area of 0.66 cm.  Ejection fraction at that time was 45 to 50%.  He underwent cardiac catheterization on 12/22/2018 which showed mild nonobstructive coronary disease with about 40% proximal RCA stenosis.  Right heart catheterization showed a PA pressure of 52/24 with a mean of 35 mmHg.  Wedge pressure was 22 mmHg.  LVEDP was 26 mmHg.  Mean right atrial pressure was 15 mmHg . The mean gradient across the aortic valve was 41.5 mmHg with a peak gradient of 48 mmHg.  Aortic valve area was calculated at 1.02 cm.  Cardiac output was 5.8 L/min.  The patient is here with his wife and mother today.  He is disabled and does not work. Past Medical History:  Diagnosis Date   . H/O colonoscopy 2018   NORMAL  . H/O echocardiogram 10/07/2018   MODERATE TO SEVERE  AORTIC VALVE STENOSIS..EF 40-50%  . H/O endoscopy 2018  . Hodgkins lymphoma ([place])    in remission  . Hyperlipidemia, group A   . Hypertension, benign   . Narcotic dependence, in remission [facility])     Past Surgical History:  Procedure Laterality Date  . AORTIC VALVE REPLACEMENT     21 mm pericardial tissue valve 2011 in [place]  . CORONARY ARTERY BYPASS GRAFT    . RIGHT/LEFT HEART CATH AND CORONARY ANGIOGRAPHY N/A 12/22/2018   Procedure: RIGHT/LEFT HEART CATH AND CORONARY ANGIOGRAPHY;  Surgeon: Nigel Mormon, MD;  Location: [facility];  Service: Cardiovascular;  Laterality: N/A;  . SPLENECTOMY    . TEE WITHOUT CARDIOVERSION N/A 12/22/2018   Procedure: TRANSESOPHAGEAL ECHOCARDIOGRAM (TEE);  Surgeon: Nigel Mormon, MD;  Location: [facility] ENDOSCOPY;  Service: Cardiovascular;  Laterality: N/A;  . TOTAL THYROIDECTOMY    Right clavicle fracture status post ORIF Right shoulder surgery x2  Family History  Problem Relation Age of Onset  . Cancer Father   . Lung disease Neg Hx     Social History Social History   Tobacco Use  . Smoking status: Current Every Day Smoker    Packs/day: 1.50    Years: 34.00    Pack years: 51.00    Types: Cigarettes  . Smokeless tobacco: Never Used  . Tobacco comment: 2 ppd  Substance Use Topics  . Alcohol use: Not Currently  . Drug use: Not on  file    Comment: smokes 1 joint daily    Current Outpatient Medications  Medication Sig Dispense Refill  . albuterol (PROVENTIL HFA;VENTOLIN HFA) 108 (90 Base) MCG/ACT inhaler Inhale 2 puffs into the lungs every 6 (six) hours as needed (wheezing/shortness of breath).     Marland Kitchen aspirin EC 81 MG tablet Take 81 mg by mouth at bedtime.     Marland Kitchen atorvastatin (LIPITOR) 40 MG tablet Take 40 mg by mouth at bedtime.     . carvedilol (COREG) 6.25 MG tablet Take 6.25 mg by mouth at bedtime.    . clonazePAM (KLONOPIN) 1 MG  tablet Take 1 mg by mouth at bedtime.     . cyclobenzaprine (FLEXERIL) 10 MG tablet Take 10 mg by mouth at bedtime.    . fluticasone (FLOVENT HFA) 110 MCG/ACT inhaler Inhale 2 puffs into the lungs daily as needed (respiratory issues.).     Marland Kitchen Fluticasone-Umeclidin-Vilant (TRELEGY ELLIPTA) 100-62.5-25 MCG/INH AEPB Inhale 1 puff into the lungs daily. 60 each 3  . furosemide (LASIX) 40 MG tablet Take 1 tablet (40 mg total) by mouth daily. May take additional half tablet in the afternoon, as needed, for leg edema 60 tablet 2  . levothyroxine (SYNTHROID, LEVOTHROID) 200 MCG tablet Take 200 mcg by mouth daily.     Marland Kitchen losartan (COZAAR) 25 MG tablet Take 25 mg by mouth at bedtime.     . sertraline (ZOLOFT) 100 MG tablet Take 100 mg by mouth at bedtime.     . SUBOXONE 8-2 MG FILM Place 1 Film under the tongue 2 (two) times daily.   0   No current facility-administered medications for this visit.     No Known Allergies  Review of Systems  Constitutional: Positive for activity change and fatigue.       Weight gain  HENT:       Dentures  Eyes:       Blurry vision, he wears glasses.  Respiratory: Positive for shortness of breath and wheezing.        Obstructive sleep apnea.  Uses home oxygen  Cardiovascular: Positive for chest pain and leg swelling.  Gastrointestinal: Positive for constipation.       Difficulty swallowing  Endocrine: Negative.   Genitourinary: Positive for frequency.  Musculoskeletal: Positive for arthralgias, back pain, gait problem, joint swelling, myalgias, neck pain and neck stiffness.  Skin: Negative.   Allergic/Immunologic: Negative.   Neurological: Positive for dizziness. Negative for syncope.       Chronic pain  Hematological: Negative.   Psychiatric/Behavioral:       Anxiety and depression    BP 116/70 (BP Location: Right Arm, Patient Position: Sitting, Cuff Size: Large)   Pulse 79   Resp 18   Ht 5\' 10"  (1.778 m)   Wt 245 lb (111.1 kg)   SpO2 95% Comment: RA   BMI 35.15 kg/m  Physical Exam Constitutional:      Comments: Obese gentleman in no distress  HENT:     Head: Normocephalic and atraumatic.     Mouth/Throat:     Mouth: Mucous membranes are moist.     Pharynx: Oropharynx is clear.  Eyes:     Extraocular Movements: Extraocular movements intact.     Conjunctiva/sclera: Conjunctivae normal.  Neck:     Musculoskeletal: Neck rigidity present.     Comments: Kyphotic curve to the neck Cardiovascular:     Rate and Rhythm: Normal rate and regular rhythm.     Pulses: Normal pulses.  Heart sounds: Murmur present.     Comments: 3/6 systolic murmur along the right sternal border.  There is no diastolic murmur. Pulmonary:     Effort: Pulmonary effort is normal.     Comments: Decreased breath sounds throughout all lung fields. Abdominal:     General: Abdomen is flat. Bowel sounds are normal. There is no distension.     Palpations: Abdomen is soft.     Tenderness: There is no abdominal tenderness.     Comments: Midline abdominal scar  Musculoskeletal: Normal range of motion.     Right lower leg: Edema present.     Left lower leg: Edema present.  Lymphadenopathy:     Cervical: No cervical adenopathy.  Skin:    General: Skin is warm and dry.  Neurological:     General: No focal deficit present.  Psychiatric:        Behavior: Behavior normal.        Thought Content: Thought content normal.        Judgment: Judgment normal.      Diagnostic Tests:                             *[facility]*                         [address], [place] [address]                            [phone]  ------------------------------------------------------------------- Transesophageal Echocardiography  Patient:    Bryker, Fletchall MR #:       [id number] Study Date: 12/22/2018 Gender:     M Age:        54 Height:     177.8 cm Weight:     108.9 kg BSA:         2.36 m^2 Pt. Status: Room:   SONOGRAPHER  Chelsea Androw  ADMITTING    Vernell Leep, MD  Blanchard, MD  Starks, MD  PERFORMING   Vernell Leep, MD  REFERRING    Vernell Leep, MD  cc:  ------------------------------------------------------------------- LV EF: 55% -   60%  ------------------------------------------------------------------- Indications:      Aortic stenosis 424.1.  ------------------------------------------------------------------- History:   PMH:   Dyspnea.  Risk factors:  Opioid dependence Hypertension.  ------------------------------------------------------------------- Study Conclusions  - Left ventricle: There was moderate concentric hypertrophy.   Systolic function was normal. The estimated ejection fraction was   in the range of 55% to 60%. Wall motion was normal; there were no   regional wall motion abnormalities. - Aortic valve: Well seated 21 mm Carpentier-Edwards bioprosthetic   aortic valve. Mild pannus formation. Normal excursion of aortic   valve leaflets.   Severe prosthetic valve stenosis. Mean PG 56 mmHg. Vmax 4.5   m/sec. Acceleration time 148 msec.   Higher DVI likely suggests subvalvular narrowing/small annulus. - Mitral valve: Mildly calcified annulus. There was mild   regurgitation. - Tricuspid valve: There was mild regurgitation. Peak RV-RA   gradient (S): 28 mm Hg.  ------------------------------------------------------------------- Labs, prior  tests, procedures, and surgery: S/P AVR.  ------------------------------------------------------------------- Study data:   Study status:  Routine.  Consent:  The risks, benefits, and alternatives to the procedure were explained to the patient and informed consent was obtained.  Procedure:  The patient reported no pain pre or post test. Initial setup. The patient was brought to the laboratory. Surface ECG leads were  monitored. Sedation. Conscious sedation was administered by cardiology staff. Transesophageal echocardiography. Topical anesthesia was obtained using viscous lidocaine. An adult multiplane transesophageal probe was inserted by the attending cardiologistwithout difficulty. Image quality was adequate.  Study completion:  The patient tolerated the procedure well. There were no complications.          Diagnostic transesophageal echocardiography.  2D and color Doppler. Birthdate:  Patient birthdate: 09/27/1966.  Age:  Patient is 53 yr old.  Sex:  Gender: male.    BMI: 34.4 kg/m^2.  Blood pressure: 109/65  Patient status:  Inpatient.  Study date:  Study date: 12/22/2018. Study time: 07:54 AM.  Location:  Endoscopy.  -------------------------------------------------------------------  ------------------------------------------------------------------- Left ventricle:  There was moderate concentric hypertrophy. Systolic function was normal. The estimated ejection fraction was in the range of 55% to 60%. Wall motion was normal; there were no regional wall motion abnormalities.  ------------------------------------------------------------------- Aortic valve:  Well seated 21 mm Carpentier-Edwards bioprosthetic aortic valve. Mild pannus formation. Normal excursion of aortic valve leaflets. Severe prosthetic valve stenosis. Mean PG 56 mmHg. Vmax 4.5 m/sec. Acceleration time 148 msec. Higher DVI likely suggests subvalvular narrowing/small annulus. Doppler:     Indexed valve area (VTI): 0.62 cm^2/m^2. Peak velocity ratio of LVOT to aortic valve: 0.42. Valve area (Vmax): 1.58 cm^2. Indexed valve area (Vmax): 0.67 cm^2/m^2.    Peak gradient (S): 86 mm Hg.  ------------------------------------------------------------------- Aorta:  The aorta was normal.  ------------------------------------------------------------------- Mitral valve:   Mildly calcified annulus.  Doppler:  There was  mild regurgitation.    Valve area by continuity equation (using LVOT flow): 4.01 cm^2. Indexed valve area by continuity equation (using LVOT flow): 1.7 cm^2/m^2.    Mean gradient (D): 3 mm Hg.  ------------------------------------------------------------------- Left atrium:  The atrium was normal in size.  No evidence of thrombus in the atrial cavity or appendage. The appendage was morphologically a left appendage. Emptying velocity was normal.   ------------------------------------------------------------------- Right ventricle:  Systolic function was normal.  ------------------------------------------------------------------- Pulmonic valve:    The valve appears to be grossly normal.  ------------------------------------------------------------------- Tricuspid valve:   Doppler:  There was mild regurgitation.  ------------------------------------------------------------------- Right atrium:  The atrium was normal in size.  No evidence of thrombus in the atrial cavity or appendage.  ------------------------------------------------------------------- Pericardium:  The pericardium was normal in appearance. There was no pericardial effusion.  ------------------------------------------------------------------- Post procedure conclusions Ascending Aorta:  - The aorta was normal.  ------------------------------------------------------------------- Measurements   Left ventricle                               Value  Stroke volume, 2D                            161   ml  Stroke volume/bsa, 2D                        68    ml/m^2    LVOT  Value  LVOT ID, S                                   22    mm  LVOT area                                    3.8   cm^2  LVOT peak velocity, S                        193   cm/s  LVOT mean velocity, S                        137   cm/s  LVOT VTI, S                                  42.3  cm  LVOT peak  gradient, S                        15    mm Hg    Aortic valve                                 Value  Aortic valve peak velocity, S                463   cm/s  Aortic peak gradient, S                      86    mm Hg  Aortic valve area/bsa, VTI                   0.62  cm^2/m^2  Velocity ratio, peak, LVOT/AV                0.42  Aortic valve area, peak velocity             1.58  cm^2  Aortic valve area/bsa, peak velocity         0.67  cm^2/m^2    Mitral valve                                 Value  Mitral mean velocity, D                      85    cm/s  Mitral mean gradient, D                      3     mm Hg  Mitral valve area, LVOT continuity           4.01  cm^2  Mitral valve area/bsa, LVOT continuity       1.7   cm^2/m^2  Mitral annulus VTI, D                        40.1  cm    Tricuspid valve  Value  Tricuspid regurg peak velocity               265   cm/s  Tricuspid peak RV-RA gradient                28    mm Hg  Legend: (L)  and  (H)  mark values outside specified reference range.  ------------------------------------------------------------------- Prepared and Electronically Authenticated by  Vernell Leep, MD 2020-02-04T13:02:26   Physicians   Panel Physicians Referring Physician Case Authorizing Physician  Patwardhan, Reynold Bowen, MD (Primary)    Procedures   RIGHT/LEFT HEART CATH AND CORONARY ANGIOGRAPHY  Conclusion   Mild nonobstructive CAD (Prox RCA 40% stenosis) Severe bioprothetic aortic valve stenosis WHO Grp II moderate pulmonary hypertension (Mean PA 25 mmHg) Elevated filling pressures (PCWP 25 mmHg, LVEDP 26 mmHg)  Recommendation: Surgical consult for redo aortic valve replacement surgery.  Nigel Mormon, MD [facility] Pager: [phone] Office: [phone] If no answer Cell [phone]    Recommendations   Antiplatelet/Anticoag Continue Aspirin 81 mg for mild CAD and s/p  bioprosthetic AVR  Surgeon Notes     12/22/2018 8:48 AM CV Procedure signed by Nigel Mormon, MD  Indications   Aortic valve stenosis, etiology of cardiac valve disease unspecified [I35.0 (ICD-10-CM)]  Procedural Details   Technical Details Procedures: 1. Right heart catheterization 2. Left heart catheterization 3. Selective left and right coronary angiography  Indication: Severe aortic stenosis Pre-op workup  History: 53 year old Caucasian male status post bioproshtetic AVR (21 mm pericardial tissue valve), in 2011 in [place], nonobstructive coronary artery disease, h/o Hodgkin's lymphoma in remission, opioid dependence currently on Suboxone, prior orthoedic surgeries, now with exertional dyspnea, suspected severe prosthetic aortic valve stenosis.   Diagnostic Angiography: Catheter/s advances over guidewire under fluoroscopy Left coronary artery: 5 Fr JL 3.5  Right coronary artery: 5 fr JR 4 Left heart catheterization: 5 Fr Kandyce Rud  Anticoagulation:  7000 units heparin  Hemostasis: TR band  Total contrast used: 60 cc   Total fluoro time: 5.0 min Air Kerma: 411 mGy  All wires and catheters removed out of the body at the end of the procedure Final angiogram showed no dissection/perforation         Estimated blood loss <50 mL.   During this procedure no sedation was administered.  Medications  (Filter: Administrations occurring from 12/22/18 1128 to 12/22/18 1241)  Medication Rate/Dose/Volume Action  Date Time   lidocaine (PF) (XYLOCAINE) 1 % injection (mL) 2 mL Given 12/22/18 1154   Total dose as of 12/29/18 1503 2 mL Given 1154   4 mL        Radial Cocktail/Verapamil only (mL) 5 mL Given 12/22/18 1155   Total dose as of 12/29/18 1503        5 mL        heparin injection (Units) 7,000 Units Given 12/22/18 1206   Total dose as of 12/29/18 1503        7,000 Units        Heparin (Porcine) in NaCl 1000-0.9 UT/500ML-% SOLN (mL) 500 mL Given  12/22/18 1239   Total dose as of 12/29/18 1503 500 mL Given 1239   1,000 mL        iohexol (OMNIPAQUE) 350 MG/ML injection (mL) 60 mL Given 12/22/18 1239   Total dose as of 12/29/18 1503        60 mL        Complications   Complications documented before study signed (12/22/2018 12:49  PM EST)    RIGHT/LEFT HEART CATH AND CORONARY ANGIOGRAPHY   None Documented by Nigel Mormon, MD 12/22/2018 12:46 PM EST  Time Range: Intraprocedure      Coronary Findings   Diagnostic  Dominance: Co-dominant  Left Main  Vessel is normal in caliber. Vessel is angiographically normal.  Left Anterior Descending  Vessel is normal in caliber. Vessel is angiographically normal.  Left Circumflex  Vessel is normal in caliber. Vessel is angiographically normal.  Right Coronary Artery  Vessel is normal in caliber.  Prox RCA lesion 40% stenosed  Prox RCA lesion is 40% stenosed.  Intervention   No interventions have been documented.  Right Heart   Right Heart Pressures RA: 15 mmHg RV: 59/7 mmHg, RVEDP 17 mmHg PA: 52/24 mmHg, mean PA 35 mmHg PCWP: 25 mmHg  Left Heart   Left Ventricle LV end diastolic pressure is moderately elevated. Simultaneous LV-Ao pressure measurement using Langston dual lumen catheter LV 170/13 mmHg,. LVEDP 26 mmHg AO 122/69 mmHg. Mean PG 41 mmHg. AVA 1 cm2, AVAi 0.56 cm2/m2  Coronary Diagrams   Diagnostic  Dominance: Co-dominant    Intervention   Implants    No implant documentation for this case.  Syngo Images   Show images for CARDIAC CATHETERIZATION  MERGE Images   Show images for CARDIAC CATHETERIZATION   Link to Procedure Log   Procedure Log    Hemo Data    Most Recent Value  Fick Cardiac Output 5.8 L/min  Fick Cardiac Output Index 2.57 (L/min)/BSA  Aortic Mean Gradient 41.5 mmHg  Aortic Peak Gradient 48 mmHg  Aortic Valve Area 1.02  Aortic Value Area Index 0.45 cm2/BSA  RA A Wave 18 mmHg  RA V Wave 14 mmHg  RA Mean 15 mmHg  RV Systolic  Pressure 59 mmHg  RV Diastolic Pressure 7 mmHg  RV EDP 17 mmHg  PA Systolic Pressure 52 mmHg  PA Diastolic Pressure 24 mmHg  PA Mean 35 mmHg  PW A Wave 24 mmHg  PW V Wave 27 mmHg  PW Mean 22 mmHg  AO Systolic Pressure 588 mmHg  AO Diastolic Pressure 69 mmHg  AO Mean 90 mmHg  LV Systolic Pressure 502 mmHg  LV Diastolic Pressure 13 mmHg  LV EDP 26 mmHg  AOp Systolic Pressure 774 mmHg  AOp Diastolic Pressure 68 mmHg  AOp Mean Pressure 88 mmHg  LVp Systolic Pressure 128 mmHg  LVp Diastolic Pressure 12 mmHg  LVp EDP Pressure 25 mmHg  QP/QS 1  TPVR Index 13.61 HRUI  TSVR Index 29.57 HRUI  PVR SVR Ratio 0.16  TPVR/TSVR Ratio 0.46      Impression:  This 53 year old gentleman appears to have stage D, severe prosthetic aortic valve stenosis with a mean gradient of 56 mmHg by TEE, 41 mmHg by cardiac catheterization and 30 mmHg by 2D echocardiogram.  I have personally reviewed his TEE images and the Edwards pericardial valve appears to be functioning appropriately with normal leaflet motion and no significant pannus.  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  He has signs of diastolic congestive heart failure with exertional shortness of breath and lower extremity edema as well as elevated LVEDP and pulmonary artery pressures.  He is also a heavy smoker and probably has significant COPD since his arterial blood gas during his catheterization showed resting hypoxemia and hypercarbia with a PO2 of 66 and a PCO2  of 47.8.  This may be contributing to his shortness of breath and he continues to smoke 2 packs of cigarettes per day in addition to marijuana.  He is also morbidly obese is likely contributing to his shortness of breath and patient- prosthesis mismatch.  I think the only option for resolving this problem would be redo sternotomy with removal of his aortic valve prosthesis and aortic root  replacement using a porcine root.  This valve could still degenerate over time and require further intervention but he could probably have a TAVR inside of a porcine root if needed and still have an adequately sized valve.  It would not be possible to improve this situation by placing another prosthetic valve in the same annulus.  His aortic valve prosthesis appears to be functioning normally and I do not think a valve in valve TAVR would improve things either.  Redo sternotomy for aortic root replacement in this patient would be a high risk operation complicated by the fact that the patient had prior chest radiation for Hodgkin's lymphoma and has significant degenerative spine disease with immobility of his neck and chronic narcotic dependence, as well as ongoing heavy smoking and likely severe COPD, and morbid obesity with a BMI of 35.15 kg/m.  I think it is still the best long-term option for him if he can be done with a reasonable risk.  He will need full pulmonary function testing as well as a CTA of the chest, abdomen, and pelvis to evaluate the rest of his vasculature and lungs.  I told him that if he could completely quit smoking for 2 months and the remainder of his studies showed that he was still a candidate for surgery then I would consider operating on him.  If he does not quit smoking then I  would not consider him a candidate for redo surgery under any circumstances.  I reviewed the cardiac catheterization and TEE results with the patient and his family and answered all their questions about the studies and about the surgical procedure and risks involved.  Plan:  We will obtain full pulmonary function testing as well as a CTA of the chest, abdomen, and pelvis.  I will plan to see him back in 1 month to see how he is doing with smoking cessation and review the studies with him.   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   [name], MD [facility] [phone]

## 2018-12-30 ENCOUNTER — Encounter: Payer: Self-pay | Admitting: Cardiology

## 2018-12-31 ENCOUNTER — Ambulatory Visit: Payer: Self-pay | Admitting: Cardiology

## 2019-01-05 ENCOUNTER — Ambulatory Visit
Admission: RE | Admit: 2019-01-05 | Discharge: 2019-01-05 | Disposition: A | Discharge status: Home / Self Care | Payer: Medicare Other | Source: Ambulatory Visit | Attending: Surgery | Admitting: Surgery

## 2019-01-05 DIAGNOSIS — I2510 Atherosclerotic heart disease of native coronary artery without angina pectoris: Secondary | ICD-10-CM

## 2019-01-05 MED ORDER — IOPAMIDOL (ISOVUE-370) INJECTION 76%
75.0000 mL | Freq: Once | INTRAVENOUS | Status: AC | PRN
  Administered 2019-01-05: 75 mL via INTRAVENOUS

## 2019-01-07 ENCOUNTER — Ambulatory Visit (HOSPITAL_COMMUNITY)
Admission: RE | Admit: 2019-01-07 | Discharge: 2019-01-07 | Disposition: A | Discharge status: Home / Self Care | Payer: Medicare Other | Source: Ambulatory Visit | Attending: Surgery | Admitting: Surgery

## 2019-01-07 DIAGNOSIS — I2510 Atherosclerotic heart disease of native coronary artery without angina pectoris: Secondary | ICD-10-CM

## 2019-01-07 DIAGNOSIS — J988 Other specified respiratory disorders: Secondary | ICD-10-CM | POA: Insufficient documentation

## 2019-01-07 LAB — PULMONARY FUNCTION TEST
DL/VA % pred: 110 %
DL/VA: 4.85 ml/min/mmHg/L
DLCO cor % pred: 70 %
DLCO cor: 21.95 ml/min/mmHg
DLCO unc % pred: 69 %
DLCO unc: 21.5 ml/min/mmHg
FEF 25-75 Post: 1.46 L/sec
FEF 25-75 Pre: 1.22 L/sec
FEF2575-%Change-Post: 19 %
FEF2575-%Pred-Post: 40 %
FEF2575-%Pred-Pre: 33 %
FEV1-%CHANGE-POST: 5 %
FEV1-%Pred-Post: 50 %
FEV1-%Pred-Pre: 48 %
FEV1-Post: 2.12 L
FEV1-Pre: 2.01 L
FEV1FVC-%Change-Post: -4 %
FEV1FVC-%Pred-Pre: 89 %
FEV6-%Change-Post: 5 %
FEV6-%PRED-POST: 58 %
FEV6-%Pred-Pre: 55 %
FEV6-Post: 3.05 L
FEV6-Pre: 2.9 L
FEV6FVC-%Change-Post: -4 %
FEV6FVC-%Pred-Post: 98 %
FEV6FVC-%Pred-Pre: 103 %
FVC-%Change-Post: 9 %
FVC-%Pred-Post: 59 %
FVC-%Pred-Pre: 54 %
FVC-Post: 3.2 L
FVC-Pre: 2.91 L
POST FEV1/FVC RATIO: 66 %
PRE FEV6/FVC RATIO: 100 %
Post FEV6/FVC ratio: 95 %
Pre FEV1/FVC ratio: 69 %
RV % pred: 125 %
RV: 2.72 L
TLC % pred: 81 %
TLC: 5.97 L

## 2019-01-07 MED ORDER — ALBUTEROL SULFATE (2.5 MG/3ML) 0.083% IN NEBU
2.5000 mg | INHALATION_SOLUTION | Freq: Once | RESPIRATORY_TRACT | Status: AC
  Administered 2019-01-07: 2.5 mg via RESPIRATORY_TRACT

## 2019-01-11 ENCOUNTER — Ambulatory Visit: Payer: Self-pay | Admitting: Cardiology

## 2019-01-13 ENCOUNTER — Ambulatory Visit (INDEPENDENT_AMBULATORY_CARE_PROVIDER_SITE_OTHER): Payer: Medicare Other | Admitting: Cardiology

## 2019-01-13 ENCOUNTER — Encounter: Payer: Self-pay | Admitting: Cardiology

## 2019-01-13 VITALS — BP 120/69 | HR 71 | Wt 246.0 lb

## 2019-01-13 DIAGNOSIS — I2510 Atherosclerotic heart disease of native coronary artery without angina pectoris: Secondary | ICD-10-CM

## 2019-01-13 DIAGNOSIS — Z952 Presence of prosthetic heart valve: Secondary | ICD-10-CM | POA: Diagnosis not present

## 2019-01-13 DIAGNOSIS — R252 Cramp and spasm: Secondary | ICD-10-CM | POA: Diagnosis not present

## 2019-01-13 DIAGNOSIS — T82857A Stenosis of cardiac prosthetic devices, implants and grafts, initial encounter: Secondary | ICD-10-CM

## 2019-01-13 DIAGNOSIS — R600 Localized edema: Secondary | ICD-10-CM

## 2019-01-13 MED ORDER — POTASSIUM CHLORIDE CRYS ER 20 MEQ PO TBCR: 20.0000 meq | EXTENDED_RELEASE_TABLET | Freq: Every day | ORAL | 3 refills | Status: DC

## 2019-01-13 NOTE — Progress Notes (Signed)
Patient is here for follow up visit.  Subjective:   Henry Sheppard, male    DOB: 09-18-1966, 53 y.o.   MRN: [id number]   Chief Complaint  Patient presents with  . 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  Patient recently underwent right and left heart catheterization and TTE which showed nonobstructive coronary artery disease and severe patient prosthesis mismatch. Patient was seen by Dr. Cyndia Bent.  He discussed the option of redo sternotomy with removal of aortic valve prosthesis and aortic root replacement using a porcine root.  He felt that it would not be possible to improve the situation by placing another prosthetic valve in the same small annulus.  Given that his aortic valve prosthesis itself is functioning normally, valve in valve TAVR would not improve the situation.  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  He recommended PFT, CTA chest abdomen and pelvis and repeat visit in 1 month to further discuss the prospects of surgery.  Patient is here for follow-up with me today.  He continues to have exertional dyspnea with minimal activity.  He was taking Lasix 40 mg daily, but reduced back to 20 mg daily due to leg cramps.  Patient states that he would like to avoid surgery as far as possible.  Nonetheless, he is seriously considering tobacco cessation.  He is currently using nicotine patch but is going to use Chantix soon.  On a separate note, patient's wife mentioned to me privately that she thinks patient is reluctant to undergo  surgery due to his fear of dying.  Patient's younger brother passed at a very early age and she thinks this plays a role in his overall anxiety.  She specifically wants me to convey this to his primary care physician.    Past Medical History:  Diagnosis Date  . H/O colonoscopy 2018   NORMAL  . H/O echocardiogram 10/07/2018   MODERATE TO SEVERE  AORTIC VALVE STENOSIS..EF 40-50%  . H/O endoscopy 2018  . Hodgkins lymphoma ([place])    in remission  . Hyperlipidemia, group A   . Hypertension, benign   . Narcotic dependence, in remission [facility])     Past Surgical History:  Procedure Laterality Date  . AORTIC VALVE REPLACEMENT     21 mm pericardial tissue valve 2011 in [place]  . CORONARY ARTERY BYPASS GRAFT    . RIGHT/LEFT HEART CATH AND CORONARY ANGIOGRAPHY N/A 12/22/2018   Procedure: RIGHT/LEFT HEART CATH AND CORONARY ANGIOGRAPHY;  Surgeon: Nigel Mormon, MD;  Location: [facility];  Service: Cardiovascular;  Laterality: N/A;  . SPLENECTOMY    . TEE WITHOUT CARDIOVERSION N/A 12/22/2018   Procedure: TRANSESOPHAGEAL ECHOCARDIOGRAM (TEE);  Surgeon: Nigel Mormon, MD;  Location: [facility] ENDOSCOPY;  Service: Cardiovascular;  Laterality: N/A;  . TOTAL THYROIDECTOMY       Social History   Socioeconomic History  . Marital status: Married    Spouse name: Not on file  . Number of children: Not on file  . Years of education: Not on file  . Highest education level: Not on file  Occupational History  . Not  on file  Social Needs  . Financial resource strain: Not on file  . Food insecurity:    Worry: Not on file    Inability: Not on file  . Transportation needs:    Medical: Not on file    Non-medical: Not on file  Tobacco Use  . Smoking status: Current Every Day Smoker    Packs/day: 1.50    Years: 34.00    Pack years: 51.00    Types: Cigarettes  . Smokeless tobacco: Never Used  . Tobacco comment: 2 ppd  Substance and Sexual Activity  . Alcohol use: Not Currently  .  Drug use: Not on file    Comment: smokes 1 joint daily  . Sexual activity: Yes  Lifestyle  . Physical activity:    Days per week: Not on file    Minutes per session: Not on file  . Stress: Not on file  Relationships  . Social connections:    Talks on phone: Not on file    Gets together: Not on file    Attends religious service: Not on file    Active member of club or organization: Not on file    Attends meetings of clubs or organizations: Not on file    Relationship status: Not on file  . Intimate partner violence:    Fear of current or ex partner: Not on file    Emotionally abused: Not on file    Physically abused: Not on file    Forced sexual activity: Not on file  Other Topics Concern  . Not on file  Social History Narrative  . Not on file     Current Outpatient Medications on File Prior to Visit  Medication Sig Dispense Refill  . albuterol (PROVENTIL HFA;VENTOLIN HFA) 108 (90 Base) MCG/ACT inhaler Inhale 2 puffs into the lungs every 6 (six) hours as needed (wheezing/shortness of breath).     Marland Kitchen aspirin EC 81 MG tablet Take 81 mg by mouth at bedtime.     Marland Kitchen atorvastatin (LIPITOR) 40 MG tablet Take 40 mg by mouth at bedtime.     . carvedilol (COREG) 6.25 MG tablet Take 6.25 mg by mouth at bedtime.    . clonazePAM (KLONOPIN) 1 MG tablet Take 1 mg by mouth at bedtime.     . cyclobenzaprine (FLEXERIL) 10 MG tablet Take 10 mg by mouth at bedtime.    . fluticasone (FLOVENT HFA) 110 MCG/ACT inhaler Inhale 2 puffs into the lungs daily as needed (respiratory issues.).     Marland Kitchen Fluticasone-Umeclidin-Vilant (TRELEGY ELLIPTA) 100-62.5-25 MCG/INH AEPB Inhale 1 puff into the lungs daily. 60 each 3  . furosemide (LASIX) 40 MG tablet Take 1 tablet (40 mg total) by mouth daily. May take additional half tablet in the afternoon, as needed, for leg edema 60 tablet 2  . levothyroxine (SYNTHROID, LEVOTHROID) 200 MCG tablet Take 200 mcg by mouth daily.     Marland Kitchen losartan (COZAAR) 25 MG tablet Take 25 mg  by mouth at bedtime.     . sertraline (ZOLOFT) 100 MG tablet Take 100 mg by mouth at bedtime.     . SUBOXONE 8-2 MG FILM Place 1 Film under the tongue 2 (two) times daily.   0   No current facility-administered medications on file prior to visit.     Cardiovascular studies:  Cath 12/22/2018: Mild nonobstructive CAD (Prox RCA 40% stenosis) Severe bioprothetic aortic valve stenosis WHO Grp II moderate pulmonary hypertension (Mean PA 25 mmHg) Elevated filling pressures (PCWP 25 mmHg, LVEDP  26 mmHg)  Recommendation: Surgical consult for redo aortic valve replacement surgery.  TEE 12/22/2018: - Left ventricle: There was moderate concentric hypertrophy.   Systolic function was normal. The estimated ejection fraction was   in the range of 55% to 60%. Wall motion was normal; there were no   regional wall motion abnormalities. - Aortic valve: Well seated 21 mm Carpentier-Edwards bioprosthetic   aortic valve. Mild pannus formation. Normal excursion of aortic   valve leaflets.   Severe prosthetic valve stenosis. Mean PG 56 mmHg. Vmax 4.5   m/sec. Acceleration time 148 msec.   Higher DVI likely suggests subvalvular narrowing/small annulus. - Mitral valve: Mildly calcified annulus. There was mild   regurgitation. - Tricuspid valve: There was mild regurgitation. Peak RV-RA   gradient (S): 28 mm Hg.  Review of Systems  Constitution: Negative for decreased appetite, malaise/fatigue, weight gain and weight loss.  HENT: Negative for congestion.   Eyes: Negative for visual disturbance.  Cardiovascular: Positive for dyspnea on exertion and leg swelling. Negative for chest pain, palpitations and syncope.  Respiratory: Negative for shortness of breath.   Endocrine: Negative for cold intolerance.  Hematologic/Lymphatic: Does not bruise/bleed easily.  Skin: Negative for itching and rash.  Musculoskeletal: Negative for myalgias.  Gastrointestinal: Negative for abdominal pain, nausea and vomiting.   Genitourinary: Negative for dysuria.  Neurological: Negative for dizziness and weakness.  Psychiatric/Behavioral: The patient is not nervous/anxious.   All other systems reviewed and are negative.      Objective:    Vitals:   01/13/19 1506  BP: 120/69  Pulse: 71  SpO2: 90%     Physical Exam  Constitutional: He is oriented to person, place, and time. He appears well-developed and well-nourished. No distress.  HENT:  Head: Normocephalic and atraumatic.  Eyes: Pupils are equal, round, and reactive to light. Conjunctivae are normal.  Neck: No JVD present.  Cardiovascular: Normal rate and regular rhythm.  Murmur (III/VI crescendo decrescendo murmur RUSB) heard. Pulmonary/Chest: Effort normal and breath sounds normal. He has no wheezes. He has no rales.  Abdominal: Soft. Bowel sounds are normal. There is no rebound.  Musculoskeletal:        General: Edema (3+ b/l) present.  Lymphadenopathy:    He has no cervical adenopathy.  Neurological: He is alert and oriented to person, place, and time. No cranial nerve deficit.  Skin: Skin is warm and dry.  Psychiatric: He has a normal mood and affect.  Nursing note and vitals reviewed.       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  1. S/P AVR 21 mm pericardial valve (placed 2011): Bioprosthetic aortic valve with severe patient prosthesis mismatch.  Only mechanical solution is radial sternotomy with aortic root replacement and redo AVR.  Follow-up with Dr. Cyndia Bent for further decision-making regarding timing, risks, and benefits.  I encouraged him to quit smoking asap. Continue follow up with PCP.   2. Leg cramps Likely due to hypokalemia.  Started potassium  supplement.  3. Leg edema Secondary to #1.  Continue Lasix 40 mg daily along with potassium supplement.  4. Coronary artery disease involving native coronary artery of native heart without angina pectoris Continue aspirin 81 mg, lipitor 40 mg, carvedilol 6.25 bid       There are no diagnoses linked to  this encounter.   Nigel Mormon, MD [facility] [place] Pager: [phone] Office: [phone] If no answer Cell [phone]

## 2019-01-27 ENCOUNTER — Ambulatory Visit (INDEPENDENT_AMBULATORY_CARE_PROVIDER_SITE_OTHER): Payer: Medicare Other | Admitting: Surgery

## 2019-01-27 ENCOUNTER — Other Ambulatory Visit: Payer: Self-pay

## 2019-01-27 ENCOUNTER — Encounter: Payer: Self-pay | Admitting: Surgery

## 2019-01-27 VITALS — BP 124/72 | HR 67 | Resp 16 | Ht 70.0 in | Wt 243.4 lb

## 2019-01-27 DIAGNOSIS — I2510 Atherosclerotic heart disease of native coronary artery without angina pectoris: Secondary | ICD-10-CM

## 2019-01-27 DIAGNOSIS — T82857A Stenosis of cardiac prosthetic devices, implants and grafts, initial encounter: Secondary | ICD-10-CM | POA: Diagnosis not present

## 2019-01-31 ENCOUNTER — Encounter: Payer: Self-pay | Admitting: Surgery

## 2019-01-31 NOTE — Progress Notes (Signed)
HPI:  [name] returns today for follow-up of his patient- prosthesis mismatch status post aortic valve replacement in 2011 in [place] using a 21 mm Edwards pericardial valve. He had a TEE done on 12/22/2018 which showed a well-seated Edwards bioprosthetic valve with normal leaflet excursion and no significant thickening or calcification.  The mean gradient across the prosthesis was 56 mmHg.  The valve area was measured at 1.58 cm.  His most recent hemoglobin on 12/14/2018 was 14.1.  An echocardiogram on 10/07/2018 reportedly showed a mean gradient of 30 mmHg with a peak gradient of 52 mmHg and a calculated valve area of 0.66 cm.  Ejection fraction at that time was 45 to 50%.  He underwent cardiac catheterization on 12/22/2018 which showed mild nonobstructive coronary disease with about 40% proximal RCA stenosis.  Right heart catheterization showed a PA pressure of 52/24 with a mean of 35 mmHg.  Wedge pressure was 22 mmHg.  LVEDP was 26 mmHg.  Mean right atrial pressure was 15 mmHg . The mean gradient across the aortic valve was 41.5 mmHg with a peak gradient of 48 mmHg.  Aortic valve area was calculated at 1.02 cm.  Cardiac output was 5.8 L/min.  When I initially saw him on 12/28/2018 I recommended redo sternotomy for aortic root replacement to treat his severe patient- prosthesis mismatch.  He was smoking 2 packs of cigarettes per day at that time and I told him that I would consider doing surgery if his pulmonary function testing showed adequate pulmonary function and he completely quit smoking.  I also ordered a CT scan of the chest, abdomen, and pelvis for preoperative planning.  He said that he has cut down the smoking and is now smoking about 1 pack of cigarettes every 3 days.  His symptoms of congestive heart failure are unchanged with exertional fatigue and shortness of breath as well as lower extremity edema.  Current Outpatient Medications  Medication Sig Dispense Refill  . albuterol  (PROVENTIL HFA;VENTOLIN HFA) 108 (90 Base) MCG/ACT inhaler Inhale 2 puffs into the lungs every 6 (six) hours as needed (wheezing/shortness of breath).     Marland Kitchen aspirin EC 81 MG tablet Take 81 mg by mouth at bedtime.     Marland Kitchen atorvastatin (LIPITOR) 40 MG tablet Take 40 mg by mouth at bedtime.     . carvedilol (COREG) 6.25 MG tablet Take 6.25 mg by mouth at bedtime.    . clonazePAM (KLONOPIN) 1 MG tablet Take 1 mg by mouth at bedtime.     . cyclobenzaprine (FLEXERIL) 10 MG tablet Take 10 mg by mouth at bedtime.    . fluticasone (FLOVENT HFA) 110 MCG/ACT inhaler Inhale 2 puffs into the lungs daily as needed (respiratory issues.).     Marland Kitchen Fluticasone-Umeclidin-Vilant (TRELEGY ELLIPTA) 100-62.5-25 MCG/INH AEPB Inhale 1 puff into the lungs daily. 60 each 3  . furosemide (LASIX) 40 MG tablet Take 1 tablet (40 mg total) by mouth daily. May take additional half tablet in the afternoon, as needed, for leg edema 60 tablet 2  . levothyroxine (SYNTHROID, LEVOTHROID) 200 MCG tablet Take 200 mcg by mouth daily.     Marland Kitchen losartan (COZAAR) 25 MG tablet Take 25 mg by mouth at bedtime.     . potassium chloride SA (K-DUR,KLOR-CON) 20 MEQ tablet Take 1 tablet (20 mEq total) by mouth daily. 90 tablet 3  . sertraline (ZOLOFT) 100 MG tablet Take 100 mg by mouth at bedtime.     . SUBOXONE 8-2 MG  FILM Place 1 Film under the tongue 2 (two) times daily.   0   No current facility-administered medications for this visit.      Physical Exam: BP 124/72 (BP Location: Right Arm, Patient Position: Sitting, Cuff Size: Large)   Pulse 67   Resp 16   Ht 5\' 10"  (1.778 m)   Wt 243 lb 6.4 oz (110.4 kg)   SpO2 95% Comment: ON RA  BMI 34.92 kg/m  He looks well and in no distress Cardiac exam shows a 3/6 systolic murmur along the right sternal border.  There is no diastolic murmur. Breath sounds are decreased throughout There is moderate edema in both lower extremities.  Diagnostic Tests:   Ref Range & Units 3wk ago  FVC-Pre L 2.91    FVC-%Pred-Pre % 54   FVC-Post L 3.20   FVC-%Pred-Post % 59   FVC-%Change-Post % 9   FEV1-Pre L 2.01   FEV1-%Pred-Pre % 48   FEV1-Post L 2.12   FEV1-%Pred-Post % 50   FEV1-%Change-Post % 5   FEV6-Pre L 2.90   FEV6-%Pred-Pre % 55   FEV6-Post L 3.05   FEV6-%Pred-Post % 58   FEV6-%Change-Post % 5   Pre FEV1/FVC ratio % 69   FEV1FVC-%Pred-Pre % 89   Post FEV1/FVC ratio % 66   FEV1FVC-%Change-Post % -4   Pre FEV6/FVC Ratio % 100   FEV6FVC-%Pred-Pre % 103   Post FEV6/FVC ratio % 95   FEV6FVC-%Pred-Post % 98   FEV6FVC-%Change-Post % -4   FEF 25-75 Pre L/sec 1.22   FEF2575-%Pred-Pre % 33   FEF 25-75 Post L/sec 1.46   FEF2575-%Pred-Post % 40   FEF2575-%Change-Post % 19   RV L 2.72   RV % pred % 125   TLC L 5.97   TLC % pred % 81   DLCO unc ml/min/mmHg 21.50   DLCO unc % pred % 69   DLCO cor ml/min/mmHg 21.95   DLCO cor % pred % 70   DL/VA ml/min/mmHg/L 4.85   DL/VA % pred % 110   Resulting Agency  [facility]      Specimen Collected: 01/07/19 13:44 Last Resulted: 01/07/19 14:46      Results for Henry Sheppard, Henry Sheppard (MRN [id number]) as of 01/31/2019 09:12  Ref. Range 12/22/2018 12:03  Sample type Unknown ARTERIAL  pH, Arterial Latest Ref Range: 7.350 - 7.450  7.376  pCO2 arterial Latest Ref Range: 32.0 - 48.0 mmHg 47.8  pO2, Arterial Latest Ref Range: 83.0 - 108.0 mmHg 66.0 (L)  pH, Ven Latest Ref Range: 7.250 - 7.430    pCO2, Ven Latest Ref Range: 44.0 - 60.0 mmHg   pO2, Ven Latest Ref Range: 32.0 - 45.0 mmHg   TCO2 Latest Ref Range: 22 - 32 mmol/L 29  Acid-Base Excess Latest Ref Range: 0.0 - 2.0 mmol/L 2.0  Bicarbonate Latest Ref Range: 20.0 - 28.0 mmol/L 28.0  O2 Saturation Latest Units: % 92.0  Patient temperature Unknown HIDE     CLINICAL DATA:  53 year old male with a history coronary artery disease. Preop for CABG assessment  EXAM: CT ANGIOGRAPHY CHEST, ABDOMEN AND PELVIS  TECHNIQUE: Multidetector CT imaging through the chest, abdomen and pelvis was performed  using the standard protocol during bolus administration of intravenous contrast. Multiplanar reconstructed images and MIPs were obtained and reviewed to evaluate the vascular anatomy.  CONTRAST:  53mL ISOVUE-370 IOPAMIDOL (ISOVUE-370) INJECTION 76%  COMPARISON:  None.  FINDINGS: CTA CHEST FINDINGS  Cardiovascular:  Heart:  Heart size not enlarged. No pericardial fluid/thickening. Surgical changes  of median sternotomy and prior aortic valve repair. Calcifications of the left anterior descending and right coronary arteries.  Calcifications of the mitral annulus.  Related to the median sternotomy, the left brachiocephalic vein is intimately associated with the posterior aspect of the median sternotomy, at the level of the first sternal wire.  The pulmonary artery/outflow tract is 8 mm deep to the median sternotomy at the level of the fifth sternal wire.  The right ventricle is immediately beneath the sixth sternal wire.  Aorta:  Calcifications of the aortic arch. Calcifications of the branch vessels. No aneurysm or dissection. No periaortic fluid.  Pulmonary arteries:  No central, lobar, segmental, or proximal subsegmental filling defects.  Mediastinum/Nodes: Multiple lymph nodes of the mediastinum, none of which are enlarged. Unremarkable course of the thoracic esophagus.  Unremarkable thoracic inlet.  Lungs/Pleura: Mild paraseptal emphysema at the apices with pleuroparenchymal scarring. No confluent airspace disease. No pleural effusion. No endobronchial debris or bronchial wall thickening.  Musculoskeletal: No acute displaced fracture. Degenerative changes of the thoracic spine. No bony canal narrowing.  Review of the MIP images confirms the above findings.  CTA ABDOMEN AND PELVIS FINDINGS  VASCULAR  Aorta: Unremarkable course, caliber, contour of the abdominal aorta. No dissection, aneurysm, or periaortic fluid. Mild atherosclerotic  changes.  Celiac: No significant atherosclerotic changes at the celiac artery origin. Branches are patent. Replaced left hepatic artery.  SMA: SMA patent with no significant atherosclerotic changes at the origin.  Renals: Bilateral main renal arteries patent without significant atherosclerotic changes. Accessory right renal artery at the 11 o'clock position, superior to the main renal artery.  IMA: IMA patent  Right lower extremity:  Mild atherosclerotic changes of the right iliac system without significant tortuosity. Hypogastric artery patent. Common femoral patent. Proximal SFA and profunda femoris patent.  Left lower extremity:  Mild atherosclerotic changes of the left iliac system. No significant tortuosity. Hypogastric artery patent. Common femoral artery patent. Proximal profunda femoris and SFA patent.  Veins: Unremarkable appearance of the venous system.  Review of the MIP images confirms the above findings.  NON-VASCULAR  Hepatobiliary: Unremarkable liver. Unremarkable gallbladder. Surgical clips in the hilum of the liver uncertain significance.  Pancreas: Unremarkable pancreas  Spleen: Rounded soft tissue at the tail the pancreas. I suspect this is a splenule or represents splenosis in the setting of a prior splenectomy.  Adrenals/Urinary Tract: Unremarkable appearance of adrenal glands.  Right:  No hydronephrosis. Symmetric perfusion to the left. No nephrolithiasis. Unremarkable course of the right ureter.  Left:  No hydronephrosis. Symmetric perfusion to the right. No nephrolithiasis. Unremarkable course of the left ureter.  Urinary bladder decompressed  Stomach/Bowel: Unremarkable appearance of the stomach. Unremarkable appearance of small bowel. No evidence of obstruction. Normal appendix. Colonic diverticula without associated inflammatory changes.  Lymphatic: No lymphadenopathy.  Mesenteric: No free fluid or air.  No adenopathy.  Reproductive: Unremarkable prostate  Other: Fat containing umbilical hernia.  Musculoskeletal: No evidence of acute fracture. No bony canal narrowing. No significant degenerative changes of the hips.  IMPRESSION: No acute finding.  Surgical changes of prior median sternotomy and aortic valve repair. For the purposes of redo sternotomy, the left brachiocephalic vein is inseparable from the most superior sternal wire, and the right ventricle measures 8 mm deep to the inferior sternotomy.  Mitral annular calcifications.  Aortic Atherosclerosis (ICD10-I70.0). Associated coronary artery disease.  Emphysema (ICD10-J43.9).  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  Diverticular disease without  evidence of acute diverticulitis.  Signed,  Dulcy Fanny. Dellia Nims, RPVI  [facility]  [facility]   Electronically Signed   By: Corrie Mckusick D.O.   On: 01/05/2019 15:36  Impression:  This 53 year old gentleman appears to have stage D, severe prosthetic aortic valve stenosis with a mean gradient of 56 mmHg by TEE, 41 mmHg by cardiac catheterization and 30 mmHg by 2D echocardiogram.  He has a heavy smoking history with severe obstructive lung disease noted on pulmonary function testing that did not improve with bronchodilators.  His arterial blood gas showed resting hypoxemia and hypercarbia.  Given all of his history I think he would be a high risk surgical patient for redo sternotomy with removal of his aortic valve prosthesis and aortic root replacement using a porcine root.  He continues to smoke and I told him that I would not consider performing surgery unless he completely quit smoking for 2 months including marijuana.  He knows that he needs to do this but has hard time due to his long smoking history, multiple substance abuse history,  morbid obesity, and disability.  His wife is with him today and said that she understands that he would not be a candidate for surgery unless he quit smoking completely for 2 months.  If he is not going to quit smoking then he should not have surgery because his long-term prognosis is going to be poor and operative risk would be quite high.  He said that he understands this.  Plan:  He will return to see me after he has quit smoking completely for 2 months and we will repeat his PFTs at that time and make a decision about surgical treatment.  I spent 15 minutes performing this established patient evaluation and > 50% of this time was spent face to face counseling and coordinating the care of his patient-prosthesis mismatch.    [name], MD [facility] [phone]

## 2019-04-15 ENCOUNTER — Encounter: Payer: Self-pay | Admitting: Cardiology

## 2019-04-15 ENCOUNTER — Ambulatory Visit (INDEPENDENT_AMBULATORY_CARE_PROVIDER_SITE_OTHER): Payer: Medicare Other | Admitting: Cardiology

## 2019-04-15 ENCOUNTER — Other Ambulatory Visit: Payer: Self-pay

## 2019-04-15 VITALS — HR 84 | Ht 70.0 in | Wt 244.0 lb

## 2019-04-15 DIAGNOSIS — R0609 Other forms of dyspnea: Secondary | ICD-10-CM

## 2019-04-15 DIAGNOSIS — Z952 Presence of prosthetic heart valve: Secondary | ICD-10-CM | POA: Diagnosis not present

## 2019-04-15 DIAGNOSIS — I2510 Atherosclerotic heart disease of native coronary artery without angina pectoris: Secondary | ICD-10-CM | POA: Diagnosis not present

## 2019-04-15 MED ORDER — TORSEMIDE 20 MG PO TABS: 20.0000 mg | ORAL_TABLET | ORAL | 2 refills | Status: DC

## 2019-04-15 NOTE — Progress Notes (Signed)
Patient is here for follow up visit.  Subjective:   Henry Sheppard, male    DOB: 01/05/1966, 53 y.o.   MRN: [id number]   Chief Complaint  Patient presents with  . 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  Patient has been seen by Dr. Cyndia Bent for consideration for redo valve surgery. His recommendation was for the patient to have quit smoking for at least 2 months prior to considering surgery.  He has since quit smoking. He has had significant improvement in his symptoms with switching furosemide to torsemide. His leg swelling and exertional dyspnea have significantly improved. However, he feels fatigued and wonders if this is because of chantix.    Past Medical History:  Diagnosis Date  . H/O colonoscopy 2018   NORMAL  . H/O echocardiogram 10/07/2018   MODERATE TO SEVERE  AORTIC VALVE STENOSIS..EF 40-50%  . H/O endoscopy 2018  . Hodgkins lymphoma ([place])    in remission  . Hyperlipidemia, group A   . Hypertension, benign   . Narcotic dependence, in remission [facility])     Past Surgical History:  Procedure Laterality Date  . AORTIC VALVE REPLACEMENT     21 mm pericardial tissue valve 2011 in [place]  . CORONARY ARTERY BYPASS GRAFT    . RIGHT/LEFT HEART CATH AND CORONARY ANGIOGRAPHY N/A 12/22/2018   Procedure: RIGHT/LEFT HEART CATH AND CORONARY ANGIOGRAPHY;  Surgeon: Nigel Mormon, MD;  Location: [facility];  Service: Cardiovascular;  Laterality: N/A;  . SPLENECTOMY    . TEE WITHOUT CARDIOVERSION N/A 12/22/2018   Procedure: TRANSESOPHAGEAL ECHOCARDIOGRAM (TEE);  Surgeon: Nigel Mormon, MD;  Location: [facility] ENDOSCOPY;  Service: Cardiovascular;   Laterality: N/A;  . TOTAL THYROIDECTOMY       Social History   Socioeconomic History  . Marital status: Married    Spouse name: Not on file  . Number of children: Not on file  . Years of education: Not on file  . Highest education level: Not on file  Occupational History  . Not on file  Social Needs  . Financial resource strain: Not on file  . Food insecurity:    Worry: Not on file    Inability: Not on file  . Transportation needs:    Medical: Not on file    Non-medical: Not on file  Tobacco Use  . Smoking status: Current Every Day Smoker    Packs/day: 1.50    Years: 34.00    Pack years: 51.00    Types: Cigarettes  . Smokeless tobacco: Never Used  . Tobacco comment: 2 ppd  Substance and Sexual Activity  . Alcohol use: Not Currently  . Drug use: Not on file    Comment: smokes 1 joint daily  . Sexual activity: Yes  Lifestyle  . Physical activity:    Days per week: Not on file    Minutes per session: Not on file  . Stress: Not on file  Relationships  . Social connections:    Talks on phone: Not on file    Gets together: Not on file    Attends religious service: Not on file    Active member of club or organization: Not on file    Attends meetings of clubs  or organizations: Not on file    Relationship status: Not on file  . Intimate partner violence:    Fear of current or ex partner: Not on file    Emotionally abused: Not on file    Physically abused: Not on file    Forced sexual activity: Not on file  Other Topics Concern  . Not on file  Social History Narrative  . Not on file     Current Outpatient Medications on File Prior to Visit  Medication Sig Dispense Refill  . albuterol (PROVENTIL HFA;VENTOLIN HFA) 108 (90 Base) MCG/ACT inhaler Inhale 2 puffs into the lungs every 6 (six) hours as needed (wheezing/shortness of breath).     Marland Kitchen aspirin EC 81 MG tablet Take 81 mg by mouth at bedtime.     Marland Kitchen atorvastatin (LIPITOR) 40 MG tablet Take 40 mg by mouth at  bedtime.     . carvedilol (COREG) 6.25 MG tablet Take 6.25 mg by mouth at bedtime.    . clonazePAM (KLONOPIN) 1 MG tablet Take 1 mg by mouth at bedtime.     . cyclobenzaprine (FLEXERIL) 10 MG tablet Take 10 mg by mouth at bedtime.    . fluticasone (FLOVENT HFA) 110 MCG/ACT inhaler Inhale 2 puffs into the lungs daily as needed (respiratory issues.).     Marland Kitchen Fluticasone-Umeclidin-Vilant (TRELEGY ELLIPTA) 100-62.5-25 MCG/INH AEPB Inhale 1 puff into the lungs daily. 60 each 3  . furosemide (LASIX) 40 MG tablet Take 1 tablet (40 mg total) by mouth daily. May take additional half tablet in the afternoon, as needed, for leg edema 60 tablet 2  . levothyroxine (SYNTHROID, LEVOTHROID) 200 MCG tablet Take 200 mcg by mouth daily.     Marland Kitchen losartan (COZAAR) 25 MG tablet Take 25 mg by mouth at bedtime.     . potassium chloride SA (K-DUR,KLOR-CON) 20 MEQ tablet Take 1 tablet (20 mEq total) by mouth daily. 90 tablet 3  . sertraline (ZOLOFT) 100 MG tablet Take 100 mg by mouth at bedtime.     . SUBOXONE 8-2 MG FILM Place 1 Film under the tongue 2 (two) times daily.   0   No current facility-administered medications on file prior to visit.     Cardiovascular studies:  Cath 12/22/2018: Mild nonobstructive CAD (Prox RCA 40% stenosis) Severe bioprothetic aortic valve stenosis WHO Grp II moderate pulmonary hypertension (Mean PA 25 mmHg) Elevated filling pressures (PCWP 25 mmHg, LVEDP 26 mmHg)  Recommendation: Surgical consult for redo aortic valve replacement surgery.  TEE 12/22/2018: - Left ventricle: There was moderate concentric hypertrophy.   Systolic function was normal. The estimated ejection fraction was   in the range of 55% to 60%. Wall motion was normal; there were no   regional wall motion abnormalities. - Aortic valve: Well seated 21 mm Carpentier-Edwards bioprosthetic   aortic valve. Mild pannus formation. Normal excursion of aortic   valve leaflets.   Severe prosthetic valve stenosis. Mean PG  56 mmHg. Vmax 4.5   m/sec. Acceleration time 148 msec.   Higher DVI likely suggests subvalvular narrowing/small annulus. - Mitral valve: Mildly calcified annulus. There was mild   regurgitation. - Tricuspid valve: There was mild regurgitation. Peak RV-RA   gradient (S): 28 mm Hg.  Labs: 12/14/2018: Glucose 111. BUN/Cr 18/0.67. eGFR normal. Na/K 138/4.7 H/H 14/42. MCV 89. Platelets 291.  Review of Systems  Constitution: Negative for decreased appetite, malaise/fatigue, weight gain and weight loss.  HENT: Negative for congestion.   Eyes: Negative for visual disturbance.  Cardiovascular: Positive  for dyspnea on exertion and leg swelling. Negative for chest pain, palpitations and syncope.  Respiratory: Negative for shortness of breath.   Endocrine: Negative for cold intolerance.  Hematologic/Lymphatic: Does not bruise/bleed easily.  Skin: Negative for itching and rash.  Musculoskeletal: Negative for myalgias.  Gastrointestinal: Negative for abdominal pain, nausea and vomiting.  Genitourinary: Negative for dysuria.  Neurological: Negative for dizziness and weakness.  Psychiatric/Behavioral: The patient is not nervous/anxious.   All other systems reviewed and are negative.      Objective:    Vitals:   04/15/19 1007  Pulse: 84  SpO2: 99%   Filed Weights   04/15/19 1007  Weight: 110.7 kg      Physical Exam  Constitutional: He is oriented to person, place, and time. He appears well-developed and well-nourished. No distress.  HENT:  Head: Normocephalic and atraumatic.  Eyes: Pupils are equal, round, and reactive to light. Conjunctivae are normal.  Neck: No JVD present.  Cardiovascular: Normal rate and regular rhythm.  Murmur (III/VI crescendo decrescendo murmur RUSB) heard. Pulmonary/Chest: Effort normal and breath sounds normal. He has no wheezes. He has no rales.  Abdominal: Soft. Bowel sounds are normal. There is no rebound.  Musculoskeletal:        General: Edema (3+  b/l) present.  Lymphadenopathy:    He has no cervical adenopathy.  Neurological: He is alert and oriented to person, place, and time. No cranial nerve deficit.  Skin: Skin is warm and dry.  Psychiatric: He has a normal mood and affect.  Nursing note and vitals reviewed.       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  1. S/P AVR 21 mm pericardial valve (placed 2011): Bioprosthetic aortic valve with severe patient prosthesis mismatch.   His symptoms have improved with diuresis. Refilled torsemide. Stopped lasix. He has quit smoking. Recommend follow up with Dr. Cyndia Bent regarding further discussion about risks/benefits of surgery vs continued medical management.   2. Coronary artery disease involving native coronary artery of native heart without angina pectoris Nonobstructive CAD. Continue aspirin 81 mg, lipitor 40 mg, carvedilol 6.25 bid  F/u w/me in 6 months.  Nigel Mormon, MD [facility] [place] Pager: [phone] Office: [phone] If no answer Cell [phone]

## 2019-05-14 ENCOUNTER — Other Ambulatory Visit: Payer: Self-pay | Admitting: Surgery

## 2019-05-14 DIAGNOSIS — I2510 Atherosclerotic heart disease of native coronary artery without angina pectoris: Secondary | ICD-10-CM

## 2019-05-16 ENCOUNTER — Other Ambulatory Visit: Payer: Self-pay | Admitting: *Deleted

## 2019-05-17 ENCOUNTER — Other Ambulatory Visit: Payer: Self-pay | Admitting: *Deleted

## 2019-05-17 DIAGNOSIS — I2510 Atherosclerotic heart disease of native coronary artery without angina pectoris: Secondary | ICD-10-CM

## 2019-05-20 ENCOUNTER — Other Ambulatory Visit (HOSPITAL_COMMUNITY)
Admission: RE | Admit: 2019-05-20 | Discharge: 2019-05-20 | Disposition: A | Discharge status: Home / Self Care | Payer: Medicare Other | Source: Ambulatory Visit | Attending: Surgery | Admitting: Surgery

## 2019-05-20 DIAGNOSIS — Z01812 Encounter for preprocedural laboratory examination: Secondary | ICD-10-CM | POA: Diagnosis present

## 2019-05-20 DIAGNOSIS — Z1159 Encounter for screening for other viral diseases: Secondary | ICD-10-CM | POA: Insufficient documentation

## 2019-05-20 DIAGNOSIS — I2510 Atherosclerotic heart disease of native coronary artery without angina pectoris: Secondary | ICD-10-CM

## 2019-05-20 LAB — SARS CORONAVIRUS 2 (TAT 6-24 HRS): SARS Coronavirus 2: NEGATIVE

## 2019-05-24 ENCOUNTER — Other Ambulatory Visit: Payer: Self-pay

## 2019-05-24 ENCOUNTER — Ambulatory Visit (HOSPITAL_COMMUNITY)
Admission: RE | Admit: 2019-05-24 | Discharge: 2019-05-24 | Disposition: A | Discharge status: Home / Self Care | Payer: Medicare Other | Source: Ambulatory Visit | Attending: Surgery | Admitting: Surgery

## 2019-05-24 DIAGNOSIS — I2510 Atherosclerotic heart disease of native coronary artery without angina pectoris: Secondary | ICD-10-CM | POA: Insufficient documentation

## 2019-05-24 DIAGNOSIS — F17210 Nicotine dependence, cigarettes, uncomplicated: Secondary | ICD-10-CM | POA: Diagnosis not present

## 2019-05-24 DIAGNOSIS — R0600 Dyspnea, unspecified: Secondary | ICD-10-CM | POA: Diagnosis not present

## 2019-05-24 LAB — PULMONARY FUNCTION TEST
DL/VA % pred: 116 %
DL/VA: 5.09 ml/min/mmHg/L
DLCO unc % pred: 68 %
DLCO unc: 21.23 ml/min/mmHg
FEF 25-75 Post: 0.75 L/sec
FEF 25-75 Pre: 0.71 L/sec
FEF2575-%Change-Post: 6 %
FEF2575-%Pred-Post: 21 %
FEF2575-%Pred-Pre: 19 %
FEV1-%Change-Post: 4 %
FEV1-%Pred-Post: 44 %
FEV1-%Pred-Pre: 42 %
FEV1-Post: 1.82 L
FEV1-Pre: 1.74 L
FEV1FVC-%Change-Post: 4 %
FEV1FVC-%Pred-Pre: 80 %
FEV6-%Change-Post: 7 %
FEV6-%Pred-Post: 52 %
FEV6-%Pred-Pre: 48 %
FEV6-Post: 2.7 L
FEV6-Pre: 2.51 L
FEV6FVC-%Change-Post: 3 %
FEV6FVC-%Pred-Post: 99 %
FEV6FVC-%Pred-Pre: 95 %
FVC-%Change-Post: 0 %
FVC-%Pred-Post: 52 %
FVC-%Pred-Pre: 52 %
FVC-Post: 2.82 L
FVC-Pre: 2.81 L
Post FEV1/FVC ratio: 65 %
Post FEV6/FVC ratio: 96 %
Pre FEV1/FVC ratio: 62 %
Pre FEV6/FVC Ratio: 92 %
RV % pred: 134 %
RV: 2.93 L
TLC % pred: 78 %
TLC: 5.77 L

## 2019-05-24 MED ORDER — ALBUTEROL SULFATE (2.5 MG/3ML) 0.083% IN NEBU
2.5000 mg | INHALATION_SOLUTION | Freq: Once | RESPIRATORY_TRACT | Status: AC
  Administered 2019-05-24: 2.5 mg via RESPIRATORY_TRACT

## 2019-06-01 ENCOUNTER — Other Ambulatory Visit: Payer: Self-pay

## 2019-06-02 ENCOUNTER — Encounter: Payer: Self-pay | Admitting: Surgery

## 2019-06-02 ENCOUNTER — Ambulatory Visit (INDEPENDENT_AMBULATORY_CARE_PROVIDER_SITE_OTHER): Payer: Medicare Other | Admitting: Surgery

## 2019-06-02 VITALS — BP 122/69 | HR 75 | Temp 97.9°F | Resp 16 | Ht 70.0 in | Wt 245.6 lb

## 2019-06-02 DIAGNOSIS — T82857A Stenosis of cardiac prosthetic devices, implants and grafts, initial encounter: Secondary | ICD-10-CM | POA: Diagnosis not present

## 2019-06-02 DIAGNOSIS — I2510 Atherosclerotic heart disease of native coronary artery without angina pectoris: Secondary | ICD-10-CM

## 2019-06-02 NOTE — Progress Notes (Signed)
HPI:  [name] returns today for follow-up of his patient- prosthesis mismatch status post aortic valve replacement in 2011 in [place] using a 21 mm Edwards pericardial valve. He had a TEE done on 12/22/2018 which showed a well-seated Edwards bioprosthetic valve with normal leaflet excursion and no significant thickening or calcification. The mean gradient across the prosthesis was 56 mmHg.The valve area was measured at 1.58 cm. His most recent hemoglobin on 12/14/2018 was 14.1. An echocardiogram on 10/07/2018 reportedly showed a mean gradient of 30 mmHg with a peak gradient of 52 mmHg and a calculated valve area of 0.66 cm. Ejection fraction at that time was 45 to 50%. He underwent cardiac catheterization on 12/22/2018 which showed mild nonobstructive coronary disease with about 40% proximal RCA stenosis. Right heart catheterization showed a PA pressure of 52/24 with a mean of 35 mmHg. Wedge pressure was 22 mmHg. LVEDP was 26 mmHg. Mean right atrial pressure was 15 mmHg . The mean gradient across the aortic valve was 41.5 mmHg with a peak gradient of 48 mmHg. Aortic valve area was calculated at 1.02 cm. Cardiac output was 5.8 L/min. When I initially saw him on 12/28/2018 I recommended redo sternotomy for aortic root replacement to treat his severe patient- prosthesis mismatch.  He was smoking 2 packs of cigarettes per day at that time and I told him that I would consider doing surgery if his pulmonary function testing showed adequate pulmonary function and he completely quit smoking. He has quit smoking since April. He feels better but still has exertional fatigue and shortness of breath as well as edema in his legs.   Current Outpatient Medications  Medication Sig Dispense Refill  . albuterol (PROVENTIL HFA;VENTOLIN HFA) 108 (90 Base) MCG/ACT inhaler Inhale 2 puffs into the lungs every 6 (six) hours as needed (wheezing/shortness of breath).     Marland Kitchen aspirin EC 81 MG tablet Take 81 mg by  mouth at bedtime.     Marland Kitchen atorvastatin (LIPITOR) 40 MG tablet Take 40 mg by mouth at bedtime.     . carvedilol (COREG) 6.25 MG tablet Take 6.25 mg by mouth at bedtime.    . clonazePAM (KLONOPIN) 1 MG tablet Take 1 mg by mouth at bedtime.     . cyclobenzaprine (FLEXERIL) 10 MG tablet Take 10 mg by mouth at bedtime.    . fluticasone (FLOVENT HFA) 110 MCG/ACT inhaler Inhale 2 puffs into the lungs daily as needed (respiratory issues.).     Marland Kitchen Fluticasone-Umeclidin-Vilant (TRELEGY ELLIPTA) 100-62.5-25 MCG/INH AEPB Inhale 1 puff into the lungs daily. 60 each 3  . levothyroxine (SYNTHROID, LEVOTHROID) 200 MCG tablet Take 200 mcg by mouth daily.     Marland Kitchen losartan (COZAAR) 25 MG tablet Take 25 mg by mouth at bedtime.     . potassium chloride SA (K-DUR,KLOR-CON) 20 MEQ tablet Take 1 tablet (20 mEq total) by mouth daily. 90 tablet 3  . sertraline (ZOLOFT) 100 MG tablet Take 100 mg by mouth at bedtime.     . SUBOXONE 8-2 MG FILM Place 1 Film under the tongue 2 (two) times daily.   0  . torsemide (DEMADEX) 20 MG tablet Take 1 tablet (20 mg total) by mouth 7 days. 90 tablet 2   No current facility-administered medications for this visit.      Physical Exam: BP 122/69 (BP Location: Right Arm, Patient Position: Sitting, Cuff Size: Large)   Pulse 75   Temp 97.9 F (36.6 C) Comment: thermal  Resp 16  Ht 5\' 10"  (1.778 m)   Wt 245 lb 9.6 oz (111.4 kg)   SpO2 94% Comment: RA  BMI 35.24 kg/m  He looks well Cardiac exam shows a regular rate and rhythm with a 3/6 systolic murmur along the RSB. No diastolic murmur. Lungs are clear. There is mild leg edema bilat.  Diagnostic Tests:   Ref Range & Units 10d ago 23mo ago  FVC-Pre L 2.81  2.91   FVC-%Pred-Pre % 52  54   FVC-Post L 2.82  3.20   FVC-%Pred-Post % 52  59   FVC-%Change-Post % 0  9   FEV1-Pre L 1.74  2.01   FEV1-%Pred-Pre % 42  48   FEV1-Post L 1.82  2.12   FEV1-%Pred-Post % 44  50   FEV1-%Change-Post % 4  5   FEV6-Pre L 2.51  2.90    FEV6-%Pred-Pre % 48  55   FEV6-Post L 2.70  3.05   FEV6-%Pred-Post % 52  58   FEV6-%Change-Post % 7  5   Pre FEV1/FVC ratio % 62  69   FEV1FVC-%Pred-Pre % 80  89   Post FEV1/FVC ratio % 65  66   FEV1FVC-%Change-Post % 4  -4   Pre FEV6/FVC Ratio % 92  100   FEV6FVC-%Pred-Pre % 95  103   Post FEV6/FVC ratio % 96  95   FEV6FVC-%Pred-Post % 99  98   FEV6FVC-%Change-Post % 3  -4   FEF 25-75 Pre L/sec 0.71  1.22   FEF2575-%Pred-Pre % 19  33   FEF 25-75 Post L/sec 0.75  1.46   FEF2575-%Pred-Post % 21  40   FEF2575-%Change-Post % 6  19   RV L 2.93  2.72   RV % pred % 134  125   TLC L 5.77  5.97   TLC % pred % 78  81   DLCO unc ml/min/mmHg 21.23  21.50   DLCO unc % pred % 68  69   DL/VA ml/min/mmHg/L 5.09  4.85   DL/VA % pred % 116  110   Resulting Agency  [facility]      Specimen Collected: 05/24/19 09:50 Last Resulted: 05/24/19 10:53         Impression:  This 53 year old gentleman appears to have stage D,severe prosthetic aortic valve stenosis with a mean gradient of 56 mmHg by TEE, 48mmHg by cardiac catheterization and 30 mmHg by 2D echocardiogram.  I think the best option for him is redo sternotomy for aortic root replacement using a porcine root.  He has some mild stenosis in the proximal RCA but do not think it is significant enough to warrant coronary bypass.  I suspect that a bypass would probably close up due to competitive flow.  He has increased operative risk due to the redo nature of the surgery as well as his significant COPD.  His recent pulmonary function testing is essentially the same as it was back in February but I think his pulmonary function is adequate to get through surgery and he has abstained from smoking since April according to he and his wife. I discussed the operative procedure with the patient and his wife including alternative of continued observation and no surgery, benefits and risks of surgery; including but not limited to bleeding, blood  transfusion, infection, stroke, myocardial infarction, graft failure, heart block requiring a permanent pacemaker, organ dysfunction, and death.  Lillia Pauls understands and agrees to proceed.  He would like to proceed with surgery but wants to wait until September.  Plan:  The patient will call us back to schedule redo sternotomy for aortic root replacement using a Medtronic porcine root.  I spent 15 minutes performing this established patient evaluation and > 50% of this time was spent face to face counseling and coordinating the care of this patient's prosthetic aortic valve stenosis.    [name], MD [facility] [phone]

## 2019-06-03 ENCOUNTER — Other Ambulatory Visit: Payer: Self-pay | Admitting: *Deleted

## 2019-06-03 ENCOUNTER — Encounter: Payer: Self-pay | Admitting: *Deleted

## 2019-06-03 DIAGNOSIS — T82857A Stenosis of cardiac prosthetic devices, implants and grafts, initial encounter: Secondary | ICD-10-CM

## 2019-07-23 NOTE — Pre-Procedure Instructions (Signed)
Henry Sheppard  07/23/2019      [id number] - [place], [place] - [address] [place] [address] Phone: [phone] Fax: [phone]  [facility] #[id number] [place], [place] [address] Phone: [phone] Fax: [phone]    Your procedure is scheduled on Sept. 10  Report to [facility] entrance A at 5:30A.M.  Call this number if you have problems the morning of surgery:  [phone]   Remember:  Do not eat or drink after midnight.      Take these medicines the morning of surgery with A SIP OF WATER :              Albuterol inhaler--bring to hospital             flovent-bring to hospital             trelegy ellipta --bring to hospital             Levothyroxine (synthroid)             spiriva--bring to hospital                7 days prior to surgery STOP taking  Aleve, Naproxen, Ibuprofen, Motrin, Advil, Goody's, BC's,  all herbal medications, fish oil, and all vitamins.              suboxone stop today    Do not wear jewelry.  Do not wear lotions, powders, or perfumes, or deodorant.  Do not shave 48 hours prior to surgery.  Men may shave face and neck.  Do not bring valuables to the hospital.  [facility] is not responsible for any belongings or valuables.  Contacts, dentures or bridgework may not be worn into surgery.  Leave your suitcase in the car.  After surgery it may be brought to your room.  For patients admitted to the hospital, discharge time will be determined by your treatment team.  Patients discharged the day of surgery will not be allowed to drive home.    Special instructions:   [place]- Preparing For Surgery  Before surgery, you can play an important role. Because skin is not sterile, your skin needs to be as free of germs as possible. You can reduce the number of germs on your skin by washing with CHG  (chlorahexidine gluconate) Soap before surgery.  CHG is an antiseptic cleaner which kills germs and bonds with the skin to continue killing germs even after washing.    Oral Hygiene is also important to reduce your risk of infection.  Remember - BRUSH YOUR TEETH THE MORNING OF SURGERY WITH YOUR REGULAR TOOTHPASTE  Please do not use if you have an allergy to CHG or antibacterial soaps. If your skin becomes reddened/irritated stop using the CHG.  Do not shave (including legs and underarms) for at least 48 hours prior to first CHG shower. It is OK to shave your face.  Please follow these instructions carefully.   1. Shower the NIGHT BEFORE SURGERY and the MORNING OF SURGERY with CHG.   2. If you chose to wash your hair, wash your hair first as usual with your normal shampoo.  3. After you shampoo, rinse your hair and body thoroughly to remove the shampoo.  4. Use CHG as you would any other liquid soap. You can apply CHG directly to the skin and wash gently  with a scrungie or a clean washcloth.   5. Apply the CHG Soap to your body ONLY FROM THE NECK DOWN.  Do not use on open wounds or open sores. Avoid contact with your eyes, ears, mouth and genitals (private parts). Wash Face and genitals (private parts)  with your normal soap.  6. Wash thoroughly, paying special attention to the area where your surgery will be performed.  7. Thoroughly rinse your body with warm water from the neck down.  8. DO NOT shower/wash with your normal soap after using and rinsing off the CHG Soap.  9. Pat yourself dry with a CLEAN TOWEL.  10. Wear CLEAN PAJAMAS to bed the night before surgery, wear comfortable clothes the morning of surgery  11. Place CLEAN SHEETS on your bed the night of your first shower and DO NOT SLEEP WITH PETS.    Day of Surgery:  Do not apply any deodorants/lotions.  Please wear clean clothes to the hospital/surgery center.   Remember to brush your teeth WITH YOUR REGULAR  TOOTHPASTE.    Please read over the following fact sheets that you were given. Coughing and Deep Breathing, MRSA Information and Surgical Site Infection Prevention

## 2019-07-27 ENCOUNTER — Ambulatory Visit (HOSPITAL_COMMUNITY)
Admission: RE | Admit: 2019-07-27 | Discharge: 2019-07-27 | Disposition: A | Discharge status: Home / Self Care | Payer: Medicare Other | Source: Ambulatory Visit | Attending: Surgery | Admitting: Surgery

## 2019-07-27 ENCOUNTER — Other Ambulatory Visit (HOSPITAL_COMMUNITY)
Admission: RE | Admit: 2019-07-27 | Discharge: 2019-07-27 | Disposition: A | Discharge status: Home / Self Care | Payer: Medicare Other | Source: Ambulatory Visit | Attending: Surgery | Admitting: Surgery

## 2019-07-27 ENCOUNTER — Encounter (HOSPITAL_COMMUNITY): Payer: Self-pay

## 2019-07-27 ENCOUNTER — Ambulatory Visit (HOSPITAL_BASED_OUTPATIENT_CLINIC_OR_DEPARTMENT_OTHER)
Admission: RE | Admit: 2019-07-27 | Discharge: 2019-07-27 | Disposition: A | Discharge status: Home / Self Care | Payer: Medicare Other | Source: Ambulatory Visit | Attending: Surgery | Admitting: Surgery

## 2019-07-27 ENCOUNTER — Other Ambulatory Visit: Payer: Self-pay

## 2019-07-27 ENCOUNTER — Encounter (HOSPITAL_COMMUNITY)
Admission: RE | Admit: 2019-07-27 | Discharge: 2019-07-27 | Disposition: A | Discharge status: Home / Self Care | Payer: Medicare Other | Source: Ambulatory Visit | Attending: Surgery | Admitting: Surgery

## 2019-07-27 DIAGNOSIS — T82857A Stenosis of cardiac prosthetic devices, implants and grafts, initial encounter: Secondary | ICD-10-CM

## 2019-07-27 HISTORY — DX: Left bundle-branch block, unspecified: I44.7

## 2019-07-27 HISTORY — DX: Pneumonia, unspecified organism: J18.9

## 2019-07-27 HISTORY — DX: Unspecified osteoarthritis, unspecified site: M19.90

## 2019-07-27 HISTORY — DX: Dyspnea, unspecified: R06.00

## 2019-07-27 HISTORY — DX: Anxiety disorder, unspecified: F41.9

## 2019-07-27 HISTORY — DX: Chronic obstructive pulmonary disease, unspecified: J44.9

## 2019-07-27 HISTORY — DX: Depression, unspecified: F32.A

## 2019-07-27 HISTORY — DX: Cardiac murmur, unspecified: R01.1

## 2019-07-27 LAB — CBC
HCT: 45.5 % (ref 39.0–52.0)
Hemoglobin: 14.4 g/dL (ref 13.0–17.0)
MCH: 31 pg (ref 26.0–34.0)
MCHC: 31.6 g/dL (ref 30.0–36.0)
MCV: 97.8 fL (ref 80.0–100.0)
Platelets: 213 10*3/uL (ref 150–400)
RBC: 4.65 MIL/uL (ref 4.22–5.81)
RDW: 14.7 % (ref 11.5–15.5)
WBC: 12.7 10*3/uL — ABNORMAL HIGH (ref 4.0–10.5)
nRBC: 0 % (ref 0.0–0.2)

## 2019-07-27 LAB — URINALYSIS, ROUTINE W REFLEX MICROSCOPIC
Bacteria, UA: NONE SEEN
Bilirubin Urine: NEGATIVE
Glucose, UA: NEGATIVE mg/dL
Ketones, ur: NEGATIVE mg/dL
Leukocytes,Ua: NEGATIVE
Nitrite: NEGATIVE
Protein, ur: NEGATIVE mg/dL
Specific Gravity, Urine: 1.027 (ref 1.005–1.030)
pH: 6 (ref 5.0–8.0)

## 2019-07-27 LAB — COMPREHENSIVE METABOLIC PANEL
ALT: 15 U/L (ref 0–44)
AST: 19 U/L (ref 15–41)
Albumin: 3.6 g/dL (ref 3.5–5.0)
Alkaline Phosphatase: 99 U/L (ref 38–126)
Anion gap: 7 (ref 5–15)
BUN: 15 mg/dL (ref 6–20)
CO2: 27 mmol/L (ref 22–32)
Calcium: 8.9 mg/dL (ref 8.9–10.3)
Chloride: 105 mmol/L (ref 98–111)
Creatinine, Ser: 0.57 mg/dL — ABNORMAL LOW (ref 0.61–1.24)
GFR calc Af Amer: >60 mL/min (ref >60)
GFR calc non Af Amer: >60 mL/min (ref >60)
Glucose, Bld: 89 mg/dL (ref 70–99)
Potassium: 4.1 mmol/L (ref 3.5–5.1)
Sodium: 139 mmol/L (ref 135–145)
Total Bilirubin: 0.7 mg/dL (ref 0.3–1.2)
Total Protein: 7.8 g/dL (ref 6.5–8.1)

## 2019-07-27 LAB — BLOOD GAS, ARTERIAL
Acid-Base Excess: 5.6 mmol/L — ABNORMAL HIGH (ref 0.0–2.0)
Bicarbonate: 30.5 mmol/L — ABNORMAL HIGH (ref 20.0–28.0)
Drawn by: 421801
FIO2: 21
O2 Saturation: 91.5 %
Patient temperature: 98.6
pCO2 arterial: 52.8 mmHg — ABNORMAL HIGH (ref 32.0–48.0)
pH, Arterial: 7.38 (ref 7.350–7.450)
pO2, Arterial: 63.2 mmHg — ABNORMAL LOW (ref 83.0–108.0)

## 2019-07-27 LAB — SURGICAL PCR SCREEN
MRSA, PCR: NEGATIVE
Staphylococcus aureus: NEGATIVE

## 2019-07-27 LAB — ABO/RH: ABO/RH(D): A POS

## 2019-07-27 LAB — PROTIME-INR
INR: 1 (ref 0.8–1.2)
Prothrombin Time: 12.7 seconds (ref 11.4–15.2)

## 2019-07-27 LAB — APTT: aPTT: 31 seconds (ref 24–36)

## 2019-07-27 NOTE — Anesthesia Preprocedure Evaluation (Addendum)
Anesthesia Evaluation  Patient identified by MRN, date of birth, ID band Patient awake    Reviewed: Allergy & Precautions, NPO status , Patient's Chart, lab work & pertinent test results, reviewed documented beta blocker date and time   Airway Mallampati: I  TM Distance: >3 FB Neck ROM: Full    Dental  (+) Edentulous Upper, Edentulous Lower   Pulmonary shortness of breath, COPD (O2 prn),  COPD inhaler and oxygen dependent, former smoker,    Pulmonary exam normal breath sounds clear to auscultation       Cardiovascular hypertension, Pt. on home beta blockers and Pt. on medications + CAD  Normal cardiovascular exam+ Valvular Problems/Murmurs AS  Rhythm:Regular Rate:Normal  ECG: SB, rate 58. 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 Simultaneous LV-Ao pressure measurement using Langston dual lumen catheter LV 170/13 mmHg,. LVEDP 26 mmHg AO 122/69 mmHg. Mean PG 41 mmHg. AVA 1 cm2, AVAi 0.56 cm2/m2  ECHO: Left ventricle: There was moderate concentric hypertrophy. Systolic function was normal. The estimated ejection fraction was in the range of 55% to 60%. Wall motion was normal; there were no regional wall motion abnormalities. Aortic valve: Well seated 21 mm Carpentier-Edwards bioprosthetic aortic valve. Mild pannus formation. Normal excursion of aortic valve leaflets. Severe prosthetic valve stenosis. Mean PG 56 mmHg. Vmax 4.5 m/sec. Acceleration time 148 msec. Higher DVI likely suggests subvalvular narrowing/small annulus. Mitral valve: Mildly calcified annulus. There was mild regurgitation. Tricuspid valve: There was mild regurgitation. Peak RV-RA gradient (S): 28  mm Hg.    Neuro/Psych PSYCHIATRIC DISORDERS Anxiety Depression negative neurological ROS     GI/Hepatic negative GI ROS, (+)     substance abuse  ,   Endo/Other  Hypothyroidism Hodgkins lymphoma  Renal/GU negative Renal ROS     Musculoskeletal negative musculoskeletal ROS (+) narcotic dependent  Abdominal (+) + obese,   Peds  Hematology HLD   Anesthesia Other Findings SEVERE PROTHETIC AORTIC VALVE STENOSIS  Reproductive/Obstetrics                           Anesthesia Physical Anesthesia Plan  ASA: IV  Anesthesia Plan: General   Post-op Pain Management:    Induction: Intravenous  PONV Risk Score and Plan: 2 and Ondansetron, Dexamethasone, Midazolam and Treatment may vary due to age or medical condition  Airway Management Planned: Oral ETT  Additional Equipment: Arterial line, CVP, PA Cath, TEE and Ultrasound Guidance Line Placement  Intra-op Plan:   Post-operative Plan: Post-operative intubation/ventilation  Informed Consent: I have reviewed the patients History and Physical, chart, labs and discussed the procedure including the risks, benefits and alternatives for the proposed anesthesia with the patient or authorized representative who has indicated his/her understanding and acceptance.       Plan Discussed with: CRNA  Anesthesia Plan Comments: (Reviewed PAT note written 07/27/2019 by Myra Gianotti, PA-C. )      Anesthesia Quick Evaluation

## 2019-07-27 NOTE — Progress Notes (Addendum)
PCP - Mort Sawyers Cardiologist - Patwardhan  Chest x-ray - today EKG - today Stress Test -  ECHO - 12-22-18 Cardiac Cath - 12-22-18  Sleep Study - na CPAP -   Fasting Blood Sugar - na Checks Blood Sugar _____ times a day  Blood Thinner Instructions: Aspirin Instructions: will not take dos  Anesthesia review: home oxygen,recent passing out past 2 nights (pt. Reported he took xanax and klonopin the other night and when he was standing up he passed out and fell. States he didn't mean to take both pills. States the doctor change the klonopin to xanax for his anxiety. Also stated he took  Xanax yesterday and the klonopin  Last night and passed out and fell. Pt. Has scratches/abrasions on left arm. Notified [place]. Pt. To stop suboxone  Today  Patient denies , fever, cough and chest pain at PAT appointment   Patient verbalized understanding of instructions that were given to them at the PAT appointment. Patient was also instructed that they will need to review over the PAT instructions again at home before surgery.

## 2019-07-27 NOTE — Progress Notes (Signed)
Anesthesia Note:  Case: [id number] Date/Time: 07/29/19 0715   Procedures:      REDO STERNOTOMY (N/A )     ASCENDING AORTIC PORCINE ROOT REPLACEMENT (N/A Chest)     TRANSESOPHAGEAL ECHOCARDIOGRAM (TEE) (N/A )   Anesthesia type: General   Pre-op diagnosis: SEVERE PROTHETIC AORTIC VALVE STENOSIS   Location: [facility] OR ROOM 17 / [facility]   Surgeon: [name], MD      DISCUSSION: Patient is a 53 year old male scheduled for the above procedure.  History includes aortic valve disease (s/p 21 mm Edwards pericardial AVR 2011 in [place], with patient-prosthesis mismatch and severe AS 12/2018), former smoker (quit 03/16/19), CAD (40% RCA 12/2018), left BBB, HTN, HLD, Hodgkin's Lymphoma (~ age 25 years old; s/p chemotherapy and chest/neck radiation; currently in remission), COPD (2L nocturnal O2), dyspnea, depression, anxiety, opioid dependence, splenectomy (age 74), total thyroidectomy (post-surgical hypothyroidism).  I spoke with patient during his PAT visit because he reported two episode of possible syncope over the long holiday weekend. He reported increasing anxiety particularly with upcoming surgery, and was recently changed from clonazepam to alprazolam and inadvertently took both on the evening of 07/25/19 and either had a syncopal episode or became so lethargic that he fell asleep while standing up. He came to time he hit the floor. He did sustain a LUE abrasion. On 07/26/19, he also took alprazolam but says family described him as almost in a "sleep walking" state. He took clonazepam later that evening and again had a possible syncopal episode. He says he otherwise has not had chest pain, worsening SOB or edema. He believes events were related to the Xanax, which he says he will stop. He has otherwise been awake and alert when not taking both the Xanax and clonazepam. Given that he potentially had two syncopal events over the past 48 hours and has known severe AS, I contacted Dr. Virgina Jock to report events. He  actually came and spoke with Mr. Giza while patient still at his PAT appointment. Events felt likely related to him taking both clonazepam and alprazolam; However, Dr. Virgina Jock did advise patient to contact 911 if he has recurrent syncope between now and surgery, so he can be further evaluated and potentially admitted prior to surgery if indicated. (I updated [facility]CTS RN Ryan.)  07/27/19 presurgical COVID-19 test and A1c are in process. Anesthesia team to evaluate on the day of surgery.    VS: BP 111/61   Pulse 70   Temp 36.5 C   Resp 20   Ht 6' (1.829 m)   Wt 110 kg   SpO2 95%   BMI 32.90 kg/m   PROVIDERS: Reed Breech, NP is listed as PCP ([place]). He is managing Suboxone therapy.  Vernell Leep, MD is cardiologist June Leap, DO is pulmonologist Sheliah Mends, MD is Pain Management provider ([place])   LABS: Preoperative labs noted. ABG results mostly consistent with results in February. Dr. Cyndia Bent would not schedule surgery until patient quit smoking for at least 2 months. He has been on home O2, primarily at night for the past few years.  (all labs ordered are listed, but only abnormal results are displayed)  Labs Reviewed  BLOOD GAS, ARTERIAL - Abnormal; Notable for the following components:      Result Value   pCO2 arterial 52.8 (*)    pO2, Arterial 63.2 (*)    Bicarbonate 30.5 (*)    Acid-Base Excess 5.6 (*)    All other components  within normal limits  CBC - Abnormal; Notable for the following components:   WBC 12.7 (*)    All other components within normal limits  COMPREHENSIVE METABOLIC PANEL - Abnormal; Notable for the following components:   Creatinine, Ser 0.57 (*)    All other components within normal limits  URINALYSIS, ROUTINE W REFLEX MICROSCOPIC - Abnormal; Notable for the following components:   Hgb urine dipstick SMALL (*)    All other components within normal limits  SURGICAL PCR SCREEN  APTT  PROTIME-INR   HEMOGLOBIN A1C  TYPE AND SCREEN     PFTs 05/24/19: FVC 2.81 (52%), post 2.82 (52%). FEV1 1.74 (42%), post 1.82 (44%). DLCO unc 21.23 (68%).    IMAGES: CXR 07/27/19: IMPRESSION: No edema or consolidation. Heart upper normal in size. Patient is status post aortic valve replacement. Postoperative changes elsewhere noted. Aorta does not appear appreciably dilated. Aortic Atherosclerosis (ICD10-I70.0).  CTA chest/abd/pelvis 01/05/19: IMPRESSION: - No acute finding. - Surgical changes of prior median sternotomy and aortic valve repair. For the purposes of redo sternotomy, the left brachiocephalic vein is inseparable from the most superior sternal wire, and the right ventricle measures 8 mm deep to the inferior sternotomy. - Mitral annular calcifications. - Aortic Atherosclerosis (ICD10-I70.0). Associated coronary artery disease. - Emphysema (ICD10-J43.9). - It appears there are changes of splenectomy, with residual splenule or splenosis of the left upper quadrant. - Diverticular disease without evidence of acute diverticulitis.   EKG: 07/27/19: Sinus bradycardia at 58 bpm Left bundle branch block Abnormal ECG - LBBB also present on 12/22/18 tracing   CV: Carotid US 07/27/19: Summary: - Right Carotid: Velocities in the right ICA are consistent with a 1-39% stenosis. - Left Carotid: Velocities in the left ICA are consistent with a 1-39% stenosis. - Vertebrals: Bilateral vertebral arteries demonstrate antegrade flow.   LHC/RHC 12/22/18: Left Main  Vessel is normal in caliber. Vessel is angiographically normal.  Left Anterior Descending  Vessel is normal in caliber. Vessel is angiographically normal.  Left Circumflex  Vessel is normal in caliber. Vessel is angiographically normal.  Right Coronary Artery  Vessel is normal in caliber.  Prox RCA lesion 40% stenosed  Prox RCA lesion is 40% stenosed.   Right Heart Pressures RA: 15 mmHg RV: 59/7 mmHg, RVEDP 17 mmHg PA: 52/24 mmHg,  mean PA 35 mmHg PCWP: 25 mmHg   Left Ventricle LV end diastolic pressure is moderately elevated. Simultaneous LV-Ao pressure measurement using Langston dual lumen catheter LV 170/13 mmHg,. LVEDP 26 mmHg AO 122/69 mmHg. Mean PG 41 mmHg. AVA 1 cm2, AVAi 0.56 cm2/m2  Conclusions: Mild nonobstructive CAD (Prox RCA 40% stenosis) Severe bioprothetic aortic valve stenosis WHO Grp II moderate pulmonary hypertension (Mean PA 25 mmHg) Elevated filling pressures (PCWP 25 mmHg, LVEDP 26 mmHg) Recommendation: Surgical consult for redo aortic valve replacement surgery.   TEE 12/22/18: Study Conclusions - Left ventricle: There was moderate concentric hypertrophy.   Systolic function was normal. The estimated ejection fraction was   in the range of 55% to 60%. Wall motion was normal; there were no   regional wall motion abnormalities. - Aortic valve: Well seated 21 mm Carpentier-Edwards bioprosthetic   aortic valve. Mild pannus formation. Normal excursion of aortic   valve leaflets.   Severe prosthetic valve stenosis. Mean PG 56 mmHg. Vmax 4.5   m/sec. Acceleration time 148 msec.   Higher DVI likely suggests subvalvular narrowing/small annulus. - Mitral valve: Mildly calcified annulus. There was mild   regurgitation. - Tricuspid valve: There was mild  regurgitation. Peak RV-RA   gradient (S): 28 mm Hg.   Past Medical History:  Diagnosis Date  . Anxiety   . Arthritis   . COPD (chronic obstructive pulmonary disease) ([place])   . Depression   . Dyspnea   . H/O colonoscopy 2018   NORMAL  . H/O echocardiogram 10/07/2018   MODERATE TO SEVERE  AORTIC VALVE STENOSIS..EF 40-50%  . H/O endoscopy 2018  . Heart murmur   . Hodgkins lymphoma ([place])    in remission  . Hyperlipidemia, group A   . Hypertension, benign   . Left bundle branch block   . Narcotic dependence, in remission ([place])   . Pneumonia     Past Surgical History:  Procedure Laterality Date  . AORTIC VALVE REPLACEMENT     21 mm  pericardial tissue valve 2011 in [place]  . CARDIAC CATHETERIZATION     12/22/18: 40% proximal RCA, normal LM, LAD, LCX  . CARDIAC VALVE REPLACEMENT     AVR pericardial tissue valve 2011 ([place])  . collarbone surgery Right   . CORONARY ARTERY BYPASS GRAFT    . lymph angiogram     when pt.  was 17 yrs. old  . RIGHT/LEFT HEART CATH AND CORONARY ANGIOGRAPHY N/A 12/22/2018   Procedure: RIGHT/LEFT HEART CATH AND CORONARY ANGIOGRAPHY;  Surgeon: Nigel Mormon, MD;  Location: [facility];  Service: Cardiovascular;  Laterality: N/A;  . SPLENECTOMY    . TEE WITHOUT CARDIOVERSION N/A 12/22/2018   Procedure: TRANSESOPHAGEAL ECHOCARDIOGRAM (TEE);  Surgeon: Nigel Mormon, MD;  Location: [facility] ENDOSCOPY;  Service: Cardiovascular;  Laterality: N/A;  . TOTAL THYROIDECTOMY      MEDICATIONS: . albuterol (PROVENTIL HFA;VENTOLIN HFA) 108 (90 Base) MCG/ACT inhaler  . aspirin EC 81 MG tablet  . atorvastatin (LIPITOR) 40 MG tablet  . carvedilol (COREG) 6.25 MG tablet  . clonazePAM (KLONOPIN) 1 MG tablet  . cyclobenzaprine (FLEXERIL) 10 MG tablet  . fluticasone (FLOVENT HFA) 110 MCG/ACT inhaler  . Fluticasone-Umeclidin-Vilant (TRELEGY ELLIPTA) 100-62.5-25 MCG/INH AEPB  . levothyroxine (SYNTHROID, LEVOTHROID) 200 MCG tablet  . losartan (COZAAR) 25 MG tablet  . potassium chloride (K-DUR) 10 MEQ tablet  . potassium chloride SA (K-DUR,KLOR-CON) 20 MEQ tablet  . sertraline (ZOLOFT) 100 MG tablet  . SUBOXONE 8-2 MG FILM  . Tiotropium Bromide Monohydrate (SPIRIVA RESPIMAT) 2.5 MCG/ACT AERS  . torsemide (DEMADEX) 20 MG tablet   No current facility-administered medications for this encounter.     Myra Gianotti, PA-C Surgical Short Stay/Anesthesiology [facility] Phone [phone] [facility] Phone [phone] 07/27/2019 5:24 PM

## 2019-07-27 NOTE — Progress Notes (Signed)
Pre cabg has been completed.   Preliminary results in CV Proc.   Henry Sheppard 07/27/2019 10:42 AM

## 2019-07-28 LAB — HEMOGLOBIN A1C
Hgb A1c MFr Bld: 6.3 % — ABNORMAL HIGH (ref 4.8–5.6)
Mean Plasma Glucose: 134 mg/dL

## 2019-07-28 LAB — SARS CORONAVIRUS 2 (TAT 6-24 HRS): SARS Coronavirus 2: NEGATIVE

## 2019-07-28 MED ORDER — INSULIN REGULAR(HUMAN) IN NACL 100-0.9 UT/100ML-% IV SOLN
INTRAVENOUS | Status: AC
  Administered 2019-07-29: 08:00:00 1 [IU]/h via INTRAVENOUS
  Filled 2019-07-28: qty 100

## 2019-07-28 MED ORDER — TRANEXAMIC ACID (OHS) BOLUS VIA INFUSION
15.0000 mg/kg | INTRAVENOUS | Status: AC
  Administered 2019-07-29: 08:00:00 1650 mg via INTRAVENOUS
  Filled 2019-07-28: qty 1650

## 2019-07-28 MED ORDER — SODIUM CHLORIDE 0.9 % IV SOLN
INTRAVENOUS | Status: DC
  Filled 2019-07-28: qty 30

## 2019-07-28 MED ORDER — DOPAMINE-DEXTROSE 3.2-5 MG/ML-% IV SOLN
0.0000 ug/kg/min | INTRAVENOUS | Status: DC
  Filled 2019-07-28: qty 250

## 2019-07-28 MED ORDER — POTASSIUM CHLORIDE 2 MEQ/ML IV SOLN
80.0000 meq | INTRAVENOUS | Status: DC
  Filled 2019-07-28: qty 40

## 2019-07-28 MED ORDER — VANCOMYCIN HCL 10 G IV SOLR
1500.0000 mg | INTRAVENOUS | Status: AC
  Administered 2019-07-29: 1500 mg via INTRAVENOUS
  Filled 2019-07-28: qty 1500

## 2019-07-28 MED ORDER — MILRINONE LACTATE IN DEXTROSE 20-5 MG/100ML-% IV SOLN
0.3000 ug/kg/min | INTRAVENOUS | Status: DC
  Filled 2019-07-28: qty 100

## 2019-07-28 MED ORDER — PLASMA-LYTE 148 IV SOLN
INTRAVENOUS | Status: DC
  Filled 2019-07-28: qty 2.5

## 2019-07-28 MED ORDER — NOREPINEPHRINE 4 MG/250ML-% IV SOLN
0.0000 ug/min | INTRAVENOUS | Status: DC
  Filled 2019-07-28: qty 250

## 2019-07-28 MED ORDER — TRANEXAMIC ACID 1000 MG/10ML IV SOLN
1.5000 mg/kg/h | INTRAVENOUS | Status: AC
  Administered 2019-07-29: 1.5 mg/kg/h via INTRAVENOUS
  Filled 2019-07-28: qty 25

## 2019-07-28 MED ORDER — DEXMEDETOMIDINE HCL IN NACL 400 MCG/100ML IV SOLN
0.1000 ug/kg/h | INTRAVENOUS | Status: AC
  Administered 2019-07-29: .3 ug/kg/h via INTRAVENOUS
  Filled 2019-07-28: qty 100

## 2019-07-28 MED ORDER — TRANEXAMIC ACID (OHS) PUMP PRIME SOLUTION
2.0000 mg/kg | INTRAVENOUS | Status: DC
  Filled 2019-07-28: qty 2.2

## 2019-07-28 MED ORDER — SODIUM CHLORIDE 0.9 % IV SOLN
1.5000 g | INTRAVENOUS | Status: AC
  Administered 2019-07-29: 1.5 g via INTRAVENOUS
  Filled 2019-07-28: qty 1.5

## 2019-07-28 MED ORDER — PHENYLEPHRINE HCL-NACL 20-0.9 MG/250ML-% IV SOLN
30.0000 ug/min | INTRAVENOUS | Status: AC
  Administered 2019-07-29: 20 ug/min via INTRAVENOUS
  Filled 2019-07-28: qty 250

## 2019-07-28 MED ORDER — NITROGLYCERIN IN D5W 200-5 MCG/ML-% IV SOLN
2.0000 ug/min | INTRAVENOUS | Status: AC
  Administered 2019-07-29: 16.6 ug/min via INTRAVENOUS
  Filled 2019-07-28: qty 250

## 2019-07-28 MED ORDER — SODIUM CHLORIDE 0.9 % IV SOLN
750.0000 mg | INTRAVENOUS | Status: AC
  Administered 2019-07-29: 750 mg via INTRAVENOUS
  Filled 2019-07-28: qty 750

## 2019-07-28 MED ORDER — EPINEPHRINE HCL 5 MG/250ML IV SOLN IN NS
0.0000 ug/min | INTRAVENOUS | Status: DC
  Filled 2019-07-28: qty 250

## 2019-07-28 MED ORDER — MAGNESIUM SULFATE 50 % IJ SOLN
40.0000 meq | INTRAMUSCULAR | Status: DC
  Filled 2019-07-28: qty 9.85

## 2019-07-28 NOTE — H&P (Signed)
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 He had a TEE done on 12/22/2018 which showed a well-seated Edwards bioprosthetic valve with normal leaflet excursion and no significant thickening or calcification. The mean gradient across the prosthesis was 56 mmHg. The valve area was measured at 1.58 cm. His most recent hemoglobin on 12/14/2018 was 14.1. An echocardiogram on 10/07/2018 reportedly showed a mean gradient of 30 mmHg with a peak gradient of 52 mmHg and a calculated valve area of 0.66 cm. Ejection fraction at that time was 45 to 50%. He underwent cardiac catheterization on 12/22/2018 which showed mild nonobstructive coronary disease with about 40% proximal RCA stenosis. Right heart catheterization showed a PA pressure of 52/24 with a mean of 35 mmHg. Wedge pressure was 22 mmHg. LVEDP was 26 mmHg. Mean right atrial pressure was 15 mmHg . The mean gradient across the aortic valve was 41.5 mmHg with a peak gradient of 48 mmHg. Aortic valve area was calculated at 1.02 cm. Cardiac output was 5.8 L/min. When I saw him initially back in February 2020 I thought  that redo sternotomy for aortic root replacement in this patient would be a high risk operation complicated by the fact that the patient had prior chest radiation for Hodgkin's lymphoma and has significant degenerative spine disease with immobility of his neck and chronic narcotic dependence, as well as ongoing heavy smoking and likely severe COPD, and morbid obesity with a BMI of 35.15 kg/m. I told him that if he quit smoking for two months that I would consider surgery. He did quit smoking and had follow up PFT's which showed a severe obstructive defect and mild diffusion defect. His ABG's have shown hypercarbia and hypoxemia.  The patient lives with his wife. He is disabled and does not work.      Past Medical History:  Diagnosis Date   H/O colonoscopy 2018   NORMAL   H/O echocardiogram 10/07/2018   MODERATE TO SEVERE AORTIC VALVE STENOSIS..EF 40-50%   H/O endoscopy 2018   Hodgkins lymphoma ([place])    in remission   Hyperlipidemia, group A    Hypertension, benign    Narcotic dependence, in remission [facility])         Past Surgical History:  Procedure Laterality Date   AORTIC VALVE REPLACEMENT     21 mm pericardial tissue valve 2011 in [place]   CORONARY ARTERY BYPASS GRAFT     RIGHT/LEFT HEART CATH AND CORONARY ANGIOGRAPHY N/A 12/22/2018   Procedure: RIGHT/LEFT HEART CATH AND CORONARY ANGIOGRAPHY; Surgeon: Nigel Mormon, MD; Location: [place] CV LAB; Service: Cardiovascular; Laterality: N/A;  SPLENECTOMY     TEE WITHOUT CARDIOVERSION N/A 12/22/2018   Procedure: TRANSESOPHAGEAL ECHOCARDIOGRAM (TEE); Surgeon: Nigel Mormon, MD; Location: [facility] ENDOSCOPY; Service: Cardiovascular; Laterality: N/A;   TOTAL THYROIDECTOMY    Right clavicle fracture status post ORIF  Right shoulder surgery x2       Family History  Problem Relation Age of Onset   Cancer Father    Lung disease Neg Hx   Social History  Social History        Tobacco Use   Smoking status: Quit  12/2018    Packs/day: 1.50    Years: 34.00    Pack years: 51.00    Types: Cigarettes   Smokeless tobacco: Never Used   Tobacco comment: 2 ppd  Substance Use Topics   Alcohol use: Not Currently   Drug use: Not on file    Comment: smokes 1 joint daily         Current Outpatient Medications  Medication Sig Dispense Refill   albuterol (PROVENTIL HFA;VENTOLIN HFA) 108 (90 Base) MCG/ACT inhaler Inhale 2 puffs into the lungs every 6 (six) hours as needed (wheezing/shortness of breath).      aspirin EC 81 MG tablet Take 81 mg by mouth at bedtime.      atorvastatin (LIPITOR) 40 MG tablet Take 40 mg by mouth at bedtime.      carvedilol (COREG) 6.25 MG tablet Take 6.25 mg by mouth at bedtime.     clonazePAM (KLONOPIN) 1 MG tablet Take 1 mg by mouth at bedtime.      cyclobenzaprine (FLEXERIL) 10 MG tablet Take 10 mg by mouth at bedtime.     fluticasone (FLOVENT HFA) 110 MCG/ACT inhaler Inhale 2 puffs into the lungs daily as needed (respiratory issues.).      Fluticasone-Umeclidin-Vilant (TRELEGY ELLIPTA) 100-62.5-25 MCG/INH AEPB Inhale 1 puff into the lungs daily. 60 each 3   furosemide (LASIX) 40 MG tablet Take 1 tablet (40 mg total) by mouth daily. May take additional half tablet in the afternoon, as needed, for leg edema 60 tablet 2   levothyroxine (SYNTHROID, LEVOTHROID) 200 MCG tablet Take 200 mcg by mouth daily.      losartan (COZAAR) 25 MG tablet Take 25 mg by mouth at bedtime.      sertraline (ZOLOFT) 100 MG tablet Take 100 mg by mouth at bedtime.      SUBOXONE 8-2 MG FILM Place 1 Film under the tongue 2 (two) times daily.   0   No current facility-administered medications for this visit.   No Known Allergies  Review of Systems  Constitutional: Positive for activity change and fatigue.  Weight gain  HENT:  Dentures  Eyes:  Blurry vision, he wears glasses.  Respiratory: Positive for shortness of breath and wheezing.  Obstructive sleep apnea. Uses home oxygen    Cardiovascular: Positive for chest pain and leg swelling.  Gastrointestinal: Positive for constipation.  Difficulty swallowing  Endocrine: Negative.  Genitourinary: Positive for frequency.  Musculoskeletal: Positive for arthralgias, back pain, gait problem, joint swelling, myalgias, neck pain and neck stiffness.  Skin: Negative.  Allergic/Immunologic: Negative.  Neurological: Positive for dizziness. Negative for syncope.  Chronic pain  Hematological: Negative.  Psychiatric/Behavioral:  Anxiety and depression   BP 116/70 (BP Location: Right Arm, Patient Position: Sitting, Cuff Size: Large)   Pulse 79   Resp 18   Ht 5\' 10"  (1.778 m)   Wt 245 lb (111.1 kg)   SpO2 95% Comment: RA   BMI 35.15 kg/m  Physical Exam  Constitutional:  Comments: Obese gentleman in no distress  HENT:  Head: Normocephalic and atraumatic.  Mouth/Throat:  Mouth: Mucous membranes are moist.  Pharynx: Oropharynx is clear.  Eyes:  Extraocular Movements: Extraocular movements intact.  Conjunctiva/sclera: Conjunctivae normal.  Neck:  Musculoskeletal: Neck rigidity present.  Comments: Kyphotic curve to the neck Cardiovascular:  Rate and Rhythm: Normal rate and regular rhythm.  Pulses: Normal pulses.  Heart sounds: Murmur present.  Comments: 3/6 systolic murmur along the right sternal border. There is no diastolic murmur. Pulmonary:  Effort: Pulmonary effort is normal.  Comments: Decreased breath sounds throughout all lung fields. Abdominal:  General: Abdomen is flat. Bowel sounds are normal. There is no distension.  Palpations: Abdomen is soft.  Tenderness: There is no abdominal tenderness.  Comments: Midline abdominal scar  Musculoskeletal: Normal range of motion.  Right lower leg: Edema present.  Left lower leg: Edema present.  Lymphadenopathy:  Cervical: No cervical adenopathy.  Skin:  General: Skin is warm and dry.  Neurological:  General: No focal deficit present.  Psychiatric:  Behavior:  Behavior normal.  Thought Content: Thought content normal.  Judgment: Judgment normal.   Diagnostic Tests:  *[facility]*  [address], [place] [address]  [phone]  -------------------------------------------------------------------  Transesophageal Echocardiography  Patient: Henry Sheppard, Henry Sheppard  MR #: [id number]  Study Date: 12/22/2018  Gender: M  Age: 48  Height: 177.8 cm  Weight: 108.9 kg  BSA: 2.36 m^2  Pt. Status:  Room:  SONOGRAPHER Chelsea Androw  ADMITTING Vernell Leep, MD  [place], MD  [place], MD  PERFORMING Vernell Leep, MD  REFERRING Vernell Leep, MD  cc:  -------------------------------------------------------------------  LV EF: 55% - 60%  -------------------------------------------------------------------  Indications: Aortic stenosis 424.1.  -------------------------------------------------------------------  History: PMH: Dyspnea. Risk factors: Opioid dependence  Hypertension.  -------------------------------------------------------------------  Study Conclusions  - Left ventricle: There was moderate concentric hypertrophy.  Systolic function was normal. The estimated ejection fraction was  in the range of 55% to 60%. Wall motion was normal; there were no  regional wall motion abnormalities.  - Aortic valve: Well seated 21 mm Carpentier-Edwards bioprosthetic  aortic valve. Mild pannus formation. Normal excursion of aortic  valve leaflets.  Severe prosthetic valve stenosis. Mean PG 56 mmHg. Vmax 4.5  m/sec. Acceleration time 148 msec.  Higher DVI likely suggests subvalvular narrowing/small annulus.  - Mitral valve: Mildly calcified annulus. There was mild  regurgitation.  - Tricuspid valve: There was mild regurgitation. Peak RV-RA  gradient (S): 28 mm Hg.  -------------------------------------------------------------------  Labs, prior tests,  procedures, and surgery:  S/P AVR.  -------------------------------------------------------------------  Study data: Study status: Routine. Consent: The risks,  benefits, and alternatives to the procedure were explained to the  patient and informed consent was obtained. Procedure: The patient  reported no pain pre or post test. Initial setup. The patient was  brought to the laboratory. Surface ECG leads were monitored.  Sedation. Conscious sedation was administered by cardiology staff.  Transesophageal echocardiography. Topical anesthesia was obtained  using viscous lidocaine. An adult multiplane transesophageal probe  was inserted by the attending cardiologistwithout difficulty. Image  quality was adequate. Study completion: The patient tolerated the  procedure well. There were no complications. Diagnostic  transesophageal echocardiography. 2D and color Doppler.  Birthdate: Patient birthdate: 1966/10/31. Age: Patient is 53 yr  old. Sex: Gender: male. BMI: 34.4 kg/m^2. Blood pressure:  109/65 Patient status: Inpatient. Study date: Study date:  12/22/2018. Study time: 07:54 AM. Location: Endoscopy.  -------------------------------------------------------------------  -------------------------------------------------------------------  Left ventricle: There was moderate concentric hypertrophy.  Systolic function was normal. The estimated ejection fraction was  in the range of 55% to 60%. Wall motion was normal; there were no  regional wall motion abnormalities.  -------------------------------------------------------------------  Aortic valve: Well seated 21 mm Carpentier-Edwards bioprosthetic  aortic valve. Mild pannus formation. Normal excursion of aortic  valve leaflets.  Severe prosthetic valve stenosis. Mean PG 56 mmHg. Vmax 4.5 m/sec.  Acceleration time 148 msec.  Higher DVI likely suggests subvalvular narrowing/small annulus.  Doppler: Indexed valve area (VTI): 0.62 cm^2/m^2.  Peak velocity  ratio of LVOT to aortic valve: 0.42. Valve area (Vmax): 1.58 cm^2.  Indexed valve area (Vmax): 0.67 cm^2/m^2. Peak gradient (S): 86  mm Hg.  -------------------------------------------------------------------  Aorta: The aorta was normal.  -------------------------------------------------------------------  Mitral valve: Mildly calcified annulus. Doppler: There was mild  regurgitation. Valve area by continuity equation (using LVOT  flow): 4.01 cm^2. Indexed valve area by continuity equation (using  LVOT flow): 1.7 cm^2/m^2. Mean gradient (D): 3 mm Hg.  -------------------------------------------------------------------  Left atrium: The atrium was normal in size. No evidence of  thrombus in the atrial cavity or appendage. The appendage was  morphologically a left appendage. Emptying velocity was normal.  -------------------------------------------------------------------  Right ventricle: Systolic function was normal.  -------------------------------------------------------------------  Pulmonic valve: The valve appears to be grossly normal.  -------------------------------------------------------------------  Tricuspid valve: Doppler: There was mild regurgitation.  -------------------------------------------------------------------  Right atrium: The atrium was normal in size. No evidence of  thrombus in the atrial cavity or appendage.  -------------------------------------------------------------------  Pericardium: The pericardium was normal in appearance. There was  no pericardial effusion.  -------------------------------------------------------------------  Post procedure conclusions  Ascending Aorta:  - The aorta was normal.  -------------------------------------------------------------------  Measurements  Left ventricle Value  Stroke volume, 2D 161 ml  Stroke volume/bsa, 2D 68 ml/m^2  LVOT Value  LVOT ID, S 22 mm  LVOT area 3.8 cm^2  LVOT peak velocity,  S 193 cm/s  LVOT mean velocity, S 137 cm/s  LVOT VTI, S 42.3 cm  LVOT peak gradient, S 15 mm Hg  Aortic valve Value  Aortic valve peak velocity, S 463 cm/s  Aortic peak gradient, S 86 mm Hg  Aortic valve area/bsa, VTI 0.62 cm^2/m^2  Velocity ratio, peak, LVOT/AV 0.42  Aortic valve area, peak velocity 1.58 cm^2  Aortic valve area/bsa, peak velocity 0.67 cm^2/m^2  Mitral valve Value  Mitral mean velocity, D 85 cm/s  Mitral mean gradient, D 3 mm Hg  Mitral valve area, LVOT continuity 4.01 cm^2  Mitral valve area/bsa, LVOT continuity 1.7 cm^2/m^2  Mitral annulus VTI, D 40.1 cm  Tricuspid valve Value  Tricuspid regurg peak velocity 265 cm/s  Tricuspid peak RV-RA gradient 28 mm Hg  Legend:  (L) and (H) mark values outside specified reference range.  -------------------------------------------------------------------  Prepared and Electronically Authenticated by  Vernell Leep, MD  2020-02-04T13:02:26  Physicians  Panel Physicians Referring Physician Case Authorizing Physician  Patwardhan, Reynold Bowen, MD (Primary)    Procedures  RIGHT/LEFT HEART CATH AND CORONARY ANGIOGRAPHY  Conclusion  Mild nonobstructive CAD (Prox RCA 40% stenosis)  Severe bioprothetic aortic valve stenosis  WHO Grp II moderate pulmonary hypertension (Mean PA 25 mmHg)  Elevated filling pressures (PCWP 25 mmHg, LVEDP 26 mmHg)  Recommendation:  Surgical consult for redo aortic valve replacement surgery.  Nigel Mormon, MD  [facility]  Pager: [phone]  Office: [phone]  If no answer Cell [phone]   Recommendations  Antiplatelet/Anticoag Continue Aspirin 81 mg for mild CAD  and s/p bioprosthetic AVR  Surgeon Notes    12/22/2018 8:48 AM CV Procedure signed by Nigel Mormon, MD  Indications  Aortic valve stenosis, etiology of cardiac valve disease unspecified [I35.0 (ICD-10-CM)]  Procedural Details  Technical Details Procedures: 1. Right heart catheterization 2. Left  heart catheterization 3. Selective left and right coronary angiography  Indication: Severe aortic stenosis Pre-op workup  History: 53 year old Caucasian male status post bioproshtetic AVR (21 mm pericardial tissue valve), in 2011 in [place], nonobstructive coronary artery disease, h/o Hodgkin's lymphoma in remission, opioid dependence currently on Suboxone, prior orthoedic surgeries, now with exertional dyspnea, suspected severe prosthetic aortic valve stenosis.   Diagnostic Angiography: Catheter/s advances over guidewire under fluoroscopy Left coronary artery: 5 Fr JL 3.5  Right coronary artery: 5 fr JR 4 Left heart catheterization: 5 Fr Kandyce Rud  Anticoagulation:  7000 units heparin  Hemostasis: TR band  Total contrast used: 60 cc   Total fluoro time: 5.0 min Air Kerma: 411 mGy  All wires and catheters removed out of the body at the end of the procedure Final angiogram showed no dissection/perforation         Estimated blood loss <50 mL.   During this procedure no sedation was administered.  Medications  (Filter: Administrations occurring from 12/22/18 1128 to 12/22/18 1241)          Medication Rate/Dose/Volume Action  Date Time   lidocaine (PF) (XYLOCAINE) 1 % injection (mL) 2 mL Given 12/22/18 1154   Total dose as of 12/29/18 1503 2 mL Given 1154   4 mL        Radial Cocktail/Verapamil only (mL) 5 mL Given 12/22/18 1155   Total dose as of 12/29/18 1503        5 mL        heparin injection (Units) 7,000 Units Given 12/22/18 1206   Total dose as of 12/29/18 1503        7,000 Units        Heparin (Porcine) in NaCl 1000-0.9 UT/500ML-% SOLN (mL) 500 mL Given 12/22/18 1239   Total dose as of 12/29/18 1503 500 mL Given 1239   1,000 mL        iohexol (OMNIPAQUE) 350 MG/ML injection (mL) 60 mL Given 12/22/18 1239   Total dose as of 12/29/18 1503        60 mL        Complications  Complications documented before study signed (12/22/2018 12:49 PM EST)     RIGHT/LEFT HEART CATH AND CORONARY ANGIOGRAPHY   None Documented by Nigel Mormon, MD 12/22/2018 12:46 PM EST  Time Range: Intraprocedure    Coronary Findings  Diagnostic  Dominance: Co-dominant  Left Main  Vessel is normal in caliber. Vessel is angiographically normal.  Left Anterior Descending  Vessel is normal in caliber. Vessel is angiographically normal.  Left Circumflex  Vessel is normal in caliber. Vessel is angiographically normal.  Right Coronary Artery  Vessel is normal in caliber.  Prox RCA lesion 40% stenosed  Prox RCA lesion is 40% stenosed.  Intervention  No interventions have been documented.  Right Heart  Right Heart Pressures RA: 15 mmHg RV: 59/7 mmHg, RVEDP 17 mmHg PA: 52/24 mmHg, mean PA 35 mmHg PCWP: 25 mmHg  Left Heart  Left Ventricle LV end diastolic pressure is moderately elevated. Simultaneous LV-Ao pressure measurement using Langston dual lumen catheter LV 170/13 mmHg,. LVEDP 26 mmHg AO 122/69 mmHg. Mean PG 41 mmHg. AVA 1 cm2, AVAi 0.56 cm2/m2  Coronary  Diagrams  Diagnostic  Dominance: Co-dominant   Intervention  Implants  No implant documentation for this case.  Syngo Images     Show images for CARDIAC CATHETERIZATION  MERGE Images  Link to Procedure Log   Show images for CARDIAC CATHETERIZATION Procedure Log  Hemo Data   Most Recent Value  Fick Cardiac Output 5.8 L/min  Fick Cardiac Output Index 2.57 (L/min)/BSA  Aortic Mean Gradient 41.5 mmHg  Aortic Peak Gradient 48 mmHg  Aortic Valve Area 1.02  Aortic Value Area Index 0.45 cm2/BSA  RA A Wave 18 mmHg  RA V Wave 14 mmHg  RA Mean 15 mmHg  RV Systolic Pressure 59 mmHg  RV Diastolic Pressure 7 mmHg  RV EDP 17 mmHg  PA Systolic Pressure 52 mmHg  PA Diastolic Pressure 24 mmHg  PA Mean 35 mmHg  PW A Wave 24 mmHg  PW V Wave 27 mmHg  PW Mean 22 mmHg  AO Systolic Pressure 123XX123 mmHg  AO Diastolic Pressure 69 mmHg  AO Mean 90 mmHg  LV Systolic Pressure 123XX123 mmHg  LV Diastolic  Pressure 13 mmHg  LV EDP 26 mmHg  AOp Systolic Pressure 99991111 mmHg  AOp Diastolic Pressure 68 mmHg  AOp Mean Pressure 88 mmHg  LVp Systolic Pressure [id number] mmHg  LVp Diastolic Pressure 12 mmHg  LVp EDP Pressure 25 mmHg  QP/QS 1  TPVR Index 13.61 HRUI  TSVR Index 29.57 HRUI  PVR SVR Ratio 0.16  TPVR/TSVR Ratio 0.46   Impression:   This 53 year old gentleman appears to have stage D, severe prosthetic aortic valve stenosis with a mean gradient of 56 mmHg by TEE, 41 mmHg by cardiac catheterization and 30 mmHg by 2D echocardiogram. I have personally reviewed his TEE images and the Edwards pericardial valve appears to be functioning appropriately with normal leaflet motion and no significant pannus. 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 He has signs of diastolic congestive heart failure with exertional shortness of breath and lower extremity edema as well as elevated LVEDP and pulmonary artery pressures. He has a heavy smoking history until 12/2018 and has significant COPD with his arterial blood gas during his catheterization and preop showed resting hypoxemia and hypercarbia with. This is likely contributing to his SOB. He is also morbidly obese which is likely contributing to his shortness of breath and patient- prosthesis mismatch. I think the only option for resolving this problem is redo sternotomy with removal of his aortic valve prosthesis and aortic root replacement using a porcine root. This valve could still degenerate over time and require further intervention but he could probably have a TAVR inside of a porcine root if needed and still have an adequately sized valve. It would not be possible to improve this situation by placing another prosthetic valve in the same annulus. His aortic valve prosthesis appears to be functioning normally and I do not think a valve in valve TAVR would improve things either.  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 I think it is still the best long-term option for him. I discussed the operative  procedure with the patient and family including alternatives, benefits and risks; including but not limited to bleeding, blood transfusion, infection, stroke, myocardial infarction, valve failure, heart block requiring a permanent pacemaker, organ dysfunction, and death.  Henry Sheppard understands and agrees to proceed.   Plan:    Redo sternotomy for aortic root replacement using a Medtronic Freestyle porcine root.   Henry Pollack, MD  [facility]  [phone]

## 2019-07-29 ENCOUNTER — Other Ambulatory Visit: Payer: Self-pay

## 2019-07-29 ENCOUNTER — Inpatient Hospital Stay (HOSPITAL_COMMUNITY)
Admission: RE | Admit: 2019-07-29 | Discharge: 2019-08-02 | DRG: 220 | Disposition: A | Discharge status: Home / Self Care | Payer: Medicare Other | Attending: Surgery | Admitting: Surgery

## 2019-07-29 ENCOUNTER — Inpatient Hospital Stay (HOSPITAL_COMMUNITY): Payer: Medicare Other

## 2019-07-29 ENCOUNTER — Inpatient Hospital Stay (HOSPITAL_COMMUNITY)
Admission: RE | Disposition: A | Discharge status: Home / Self Care | Payer: Self-pay | Source: Home / Self Care | Attending: Surgery

## 2019-07-29 ENCOUNTER — Inpatient Hospital Stay (HOSPITAL_COMMUNITY): Payer: Medicare Other | Admitting: Anesthesiology

## 2019-07-29 ENCOUNTER — Encounter (HOSPITAL_COMMUNITY): Payer: Self-pay

## 2019-07-29 ENCOUNTER — Inpatient Hospital Stay (HOSPITAL_COMMUNITY): Payer: Medicare Other | Admitting: Vascular Surgery

## 2019-07-29 DIAGNOSIS — M549 Dorsalgia, unspecified: Secondary | ICD-10-CM | POA: Diagnosis present

## 2019-07-29 DIAGNOSIS — Z8571 Personal history of Hodgkin lymphoma: Secondary | ICD-10-CM

## 2019-07-29 DIAGNOSIS — F1290 Cannabis use, unspecified, uncomplicated: Secondary | ICD-10-CM | POA: Diagnosis present

## 2019-07-29 DIAGNOSIS — Z7951 Long term (current) use of inhaled steroids: Secondary | ICD-10-CM

## 2019-07-29 DIAGNOSIS — Z952 Presence of prosthetic heart valve: Secondary | ICD-10-CM

## 2019-07-29 DIAGNOSIS — Z20828 Contact with and (suspected) exposure to other viral communicable diseases: Secondary | ICD-10-CM | POA: Diagnosis present

## 2019-07-29 DIAGNOSIS — I10 Essential (primary) hypertension: Secondary | ICD-10-CM | POA: Diagnosis present

## 2019-07-29 DIAGNOSIS — R0602 Shortness of breath: Secondary | ICD-10-CM | POA: Diagnosis present

## 2019-07-29 DIAGNOSIS — F17210 Nicotine dependence, cigarettes, uncomplicated: Secondary | ICD-10-CM | POA: Diagnosis present

## 2019-07-29 DIAGNOSIS — E669 Obesity, unspecified: Secondary | ICD-10-CM | POA: Diagnosis present

## 2019-07-29 DIAGNOSIS — J90 Pleural effusion, not elsewhere classified: Secondary | ICD-10-CM

## 2019-07-29 DIAGNOSIS — I2510 Atherosclerotic heart disease of native coronary artery without angina pectoris: Secondary | ICD-10-CM | POA: Diagnosis present

## 2019-07-29 DIAGNOSIS — D62 Acute posthemorrhagic anemia: Secondary | ICD-10-CM | POA: Diagnosis not present

## 2019-07-29 DIAGNOSIS — E785 Hyperlipidemia, unspecified: Secondary | ICD-10-CM | POA: Diagnosis present

## 2019-07-29 DIAGNOSIS — D696 Thrombocytopenia, unspecified: Secondary | ICD-10-CM | POA: Diagnosis not present

## 2019-07-29 DIAGNOSIS — E119 Type 2 diabetes mellitus without complications: Secondary | ICD-10-CM | POA: Diagnosis present

## 2019-07-29 DIAGNOSIS — Y831 Surgical operation with implant of artificial internal device as the cause of abnormal reaction of the patient, or of later complication, without mention of misadventure at the time of the procedure: Secondary | ICD-10-CM | POA: Diagnosis present

## 2019-07-29 DIAGNOSIS — F1121 Opioid dependence, in remission: Secondary | ICD-10-CM | POA: Diagnosis present

## 2019-07-29 DIAGNOSIS — G8929 Other chronic pain: Secondary | ICD-10-CM | POA: Diagnosis present

## 2019-07-29 DIAGNOSIS — Z9081 Acquired absence of spleen: Secondary | ICD-10-CM | POA: Diagnosis not present

## 2019-07-29 DIAGNOSIS — I350 Nonrheumatic aortic (valve) stenosis: Secondary | ICD-10-CM

## 2019-07-29 DIAGNOSIS — T82857A Stenosis of cardiac prosthetic devices, implants and grafts, initial encounter: Secondary | ICD-10-CM | POA: Diagnosis present

## 2019-07-29 DIAGNOSIS — M1990 Unspecified osteoarthritis, unspecified site: Secondary | ICD-10-CM | POA: Diagnosis present

## 2019-07-29 DIAGNOSIS — Z6835 Body mass index (BMI) 35.0-35.9, adult: Secondary | ICD-10-CM | POA: Diagnosis not present

## 2019-07-29 DIAGNOSIS — I454 Nonspecific intraventricular block: Secondary | ICD-10-CM | POA: Diagnosis present

## 2019-07-29 DIAGNOSIS — Z953 Presence of xenogenic heart valve: Secondary | ICD-10-CM | POA: Diagnosis not present

## 2019-07-29 DIAGNOSIS — Z923 Personal history of irradiation: Secondary | ICD-10-CM

## 2019-07-29 DIAGNOSIS — R0902 Hypoxemia: Secondary | ICD-10-CM | POA: Diagnosis present

## 2019-07-29 DIAGNOSIS — J449 Chronic obstructive pulmonary disease, unspecified: Secondary | ICD-10-CM | POA: Diagnosis present

## 2019-07-29 DIAGNOSIS — Z951 Presence of aortocoronary bypass graft: Secondary | ICD-10-CM | POA: Diagnosis not present

## 2019-07-29 DIAGNOSIS — Z79899 Other long term (current) drug therapy: Secondary | ICD-10-CM

## 2019-07-29 DIAGNOSIS — I272 Pulmonary hypertension, unspecified: Secondary | ICD-10-CM | POA: Diagnosis present

## 2019-07-29 DIAGNOSIS — Z7982 Long term (current) use of aspirin: Secondary | ICD-10-CM

## 2019-07-29 DIAGNOSIS — Z79890 Hormone replacement therapy (postmenopausal): Secondary | ICD-10-CM

## 2019-07-29 HISTORY — PX: ASCENDING AORTIC ROOT REPLACEMENT: SHX5729

## 2019-07-29 HISTORY — PX: TEE WITHOUT CARDIOVERSION: SHX5443

## 2019-07-29 LAB — POCT I-STAT 4, (NA,K, GLUC, HGB,HCT)
Glucose, Bld: 97 mg/dL (ref 70–99)
Glucose, Bld: 111 mg/dL — ABNORMAL HIGH (ref 70–99)
Glucose, Bld: 117 mg/dL — ABNORMAL HIGH (ref 70–99)
Glucose, Bld: 149 mg/dL — ABNORMAL HIGH (ref 70–99)
Glucose, Bld: 130 mg/dL — ABNORMAL HIGH (ref 70–99)
Glucose, Bld: 143 mg/dL — ABNORMAL HIGH (ref 70–99)
Glucose, Bld: 115 mg/dL — ABNORMAL HIGH (ref 70–99)
HCT: 38 % — ABNORMAL LOW (ref 39.0–52.0)
HCT: 36 % — ABNORMAL LOW (ref 39.0–52.0)
HCT: 29 % — ABNORMAL LOW (ref 39.0–52.0)
HCT: 27 % — ABNORMAL LOW (ref 39.0–52.0)
HCT: 29 % — ABNORMAL LOW (ref 39.0–52.0)
HCT: 30 % — ABNORMAL LOW (ref 39.0–52.0)
HCT: 31 % — ABNORMAL LOW (ref 39.0–52.0)
Hemoglobin: 12.9 g/dL — ABNORMAL LOW (ref 13.0–17.0)
Hemoglobin: 12.2 g/dL — ABNORMAL LOW (ref 13.0–17.0)
Hemoglobin: 9.9 g/dL — ABNORMAL LOW (ref 13.0–17.0)
Hemoglobin: 9.2 g/dL — ABNORMAL LOW (ref 13.0–17.0)
Hemoglobin: 10.2 g/dL — ABNORMAL LOW (ref 13.0–17.0)
Hemoglobin: 9.9 g/dL — ABNORMAL LOW (ref 13.0–17.0)
Hemoglobin: 10.5 g/dL — ABNORMAL LOW (ref 13.0–17.0)
Potassium: 3.7 mmol/L (ref 3.5–5.1)
Potassium: 3.9 mmol/L (ref 3.5–5.1)
Potassium: 4.1 mmol/L (ref 3.5–5.1)
Potassium: 4.4 mmol/L (ref 3.5–5.1)
Potassium: 3.7 mmol/L (ref 3.5–5.1)
Potassium: 3.8 mmol/L (ref 3.5–5.1)
Potassium: 3.6 mmol/L (ref 3.5–5.1)
Sodium: 139 mmol/L (ref 135–145)
Sodium: 139 mmol/L (ref 135–145)
Sodium: 134 mmol/L — ABNORMAL LOW (ref 135–145)
Sodium: 131 mmol/L — ABNORMAL LOW (ref 135–145)
Sodium: 134 mmol/L — ABNORMAL LOW (ref 135–145)
Sodium: 137 mmol/L (ref 135–145)
Sodium: 140 mmol/L (ref 135–145)

## 2019-07-29 LAB — PROTIME-INR
INR: 1.2 (ref 0.8–1.2)
Prothrombin Time: 15.3 seconds — ABNORMAL HIGH (ref 11.4–15.2)

## 2019-07-29 LAB — POCT I-STAT 7, (LYTES, BLD GAS, ICA,H+H)
Acid-Base Excess: 7 mmol/L — ABNORMAL HIGH (ref 0.0–2.0)
Acid-Base Excess: 6 mmol/L — ABNORMAL HIGH (ref 0.0–2.0)
Acid-Base Excess: 1 mmol/L (ref 0.0–2.0)
Acid-Base Excess: 1 mmol/L (ref 0.0–2.0)
Bicarbonate: 32.5 mmol/L — ABNORMAL HIGH (ref 20.0–28.0)
Bicarbonate: 30.7 mmol/L — ABNORMAL HIGH (ref 20.0–28.0)
Bicarbonate: 27.4 mmol/L (ref 20.0–28.0)
Bicarbonate: 26.8 mmol/L (ref 20.0–28.0)
Calcium, Ion: 1.07 mmol/L — ABNORMAL LOW (ref 1.15–1.40)
Calcium, Ion: 1.15 mmol/L (ref 1.15–1.40)
Calcium, Ion: 1.19 mmol/L (ref 1.15–1.40)
Calcium, Ion: 1.19 mmol/L (ref 1.15–1.40)
HCT: 31 % — ABNORMAL LOW (ref 39.0–52.0)
HCT: 31 % — ABNORMAL LOW (ref 39.0–52.0)
HCT: 36 % — ABNORMAL LOW (ref 39.0–52.0)
HCT: 36 % — ABNORMAL LOW (ref 39.0–52.0)
Hemoglobin: 10.5 g/dL — ABNORMAL LOW (ref 13.0–17.0)
Hemoglobin: 10.5 g/dL — ABNORMAL LOW (ref 13.0–17.0)
Hemoglobin: 12.2 g/dL — ABNORMAL LOW (ref 13.0–17.0)
Hemoglobin: 12.2 g/dL — ABNORMAL LOW (ref 13.0–17.0)
O2 Saturation: 100 %
O2 Saturation: 100 %
O2 Saturation: 93 %
O2 Saturation: 96 %
Patient temperature: 36.5
Patient temperature: 37.6
Potassium: 4 mmol/L (ref 3.5–5.1)
Potassium: 3.8 mmol/L (ref 3.5–5.1)
Potassium: 4.1 mmol/L (ref 3.5–5.1)
Potassium: 4.1 mmol/L (ref 3.5–5.1)
Sodium: 140 mmol/L (ref 135–145)
Sodium: 136 mmol/L (ref 135–145)
Sodium: 142 mmol/L (ref 135–145)
Sodium: 140 mmol/L (ref 135–145)
TCO2: 34 mmol/L — ABNORMAL HIGH (ref 22–32)
TCO2: 32 mmol/L (ref 22–32)
TCO2: 29 mmol/L (ref 22–32)
TCO2: 28 mmol/L (ref 22–32)
pCO2 arterial: 50.2 mmHg — ABNORMAL HIGH (ref 32.0–48.0)
pCO2 arterial: 45.4 mmHg (ref 32.0–48.0)
pCO2 arterial: 48.9 mmHg — ABNORMAL HIGH (ref 32.0–48.0)
pCO2 arterial: 47.6 mmHg (ref 32.0–48.0)
pH, Arterial: 7.419 (ref 7.350–7.450)
pH, Arterial: 7.438 (ref 7.350–7.450)
pH, Arterial: 7.354 (ref 7.350–7.450)
pH, Arterial: 7.361 (ref 7.350–7.450)
pO2, Arterial: 309 mmHg — ABNORMAL HIGH (ref 83.0–108.0)
pO2, Arterial: 348 mmHg — ABNORMAL HIGH (ref 83.0–108.0)
pO2, Arterial: 68 mmHg — ABNORMAL LOW (ref 83.0–108.0)
pO2, Arterial: 93 mmHg (ref 83.0–108.0)

## 2019-07-29 LAB — CBC
HCT: 38.6 % — ABNORMAL LOW (ref 39.0–52.0)
Hemoglobin: 12.3 g/dL — ABNORMAL LOW (ref 13.0–17.0)
MCH: 30.9 pg (ref 26.0–34.0)
MCHC: 31.9 g/dL (ref 30.0–36.0)
MCV: 97 fL (ref 80.0–100.0)
Platelets: 110 10*3/uL — ABNORMAL LOW (ref 150–400)
RBC: 3.98 MIL/uL — ABNORMAL LOW (ref 4.22–5.81)
RDW: 14.6 % (ref 11.5–15.5)
WBC: 17.6 10*3/uL — ABNORMAL HIGH (ref 4.0–10.5)
nRBC: 0 % (ref 0.0–0.2)

## 2019-07-29 LAB — ECHO INTRAOPERATIVE TEE
Height: 72 in
Weight: 3881.61 oz

## 2019-07-29 LAB — FIBRINOGEN: Fibrinogen: 411 mg/dL (ref 210–475)

## 2019-07-29 LAB — GLUCOSE, CAPILLARY
Glucose-Capillary: 92 mg/dL (ref 70–99)
Glucose-Capillary: 115 mg/dL — ABNORMAL HIGH (ref 70–99)
Glucose-Capillary: 116 mg/dL — ABNORMAL HIGH (ref 70–99)
Glucose-Capillary: 89 mg/dL (ref 70–99)
Glucose-Capillary: 107 mg/dL — ABNORMAL HIGH (ref 70–99)
Glucose-Capillary: 116 mg/dL — ABNORMAL HIGH (ref 70–99)

## 2019-07-29 LAB — PLATELET COUNT: Platelets: 114 10*3/uL — ABNORMAL LOW (ref 150–400)

## 2019-07-29 LAB — HEMOGLOBIN AND HEMATOCRIT, BLOOD
HCT: 31.2 % — ABNORMAL LOW (ref 39.0–52.0)
Hemoglobin: 10.3 g/dL — ABNORMAL LOW (ref 13.0–17.0)

## 2019-07-29 LAB — APTT: aPTT: 34 seconds (ref 24–36)

## 2019-07-29 LAB — PREPARE RBC (CROSSMATCH)

## 2019-07-29 SURGERY — REDO STERNOTOMY
Anesthesia: General | Site: Chest

## 2019-07-29 MED ORDER — FENTANYL CITRATE (PF) 250 MCG/5ML IJ SOLN
INTRAMUSCULAR | Status: AC
  Filled 2019-07-29: qty 5

## 2019-07-29 MED ORDER — SUCCINYLCHOLINE CHLORIDE 200 MG/10ML IV SOSY
PREFILLED_SYRINGE | INTRAVENOUS | Status: AC
  Filled 2019-07-29: qty 10

## 2019-07-29 MED ORDER — SODIUM CHLORIDE (PF) 0.9 % IJ SOLN
INTRAMUSCULAR | Status: AC
  Filled 2019-07-29: qty 10

## 2019-07-29 MED ORDER — TRAMADOL HCL 50 MG PO TABS
50.0000 mg | ORAL_TABLET | ORAL | Status: DC | PRN
  Administered 2019-07-30: 100 mg via ORAL
  Filled 2019-07-29: qty 2

## 2019-07-29 MED ORDER — METOPROLOL TARTRATE 25 MG/10 ML ORAL SUSPENSION: 12.5000 mg | Freq: Two times a day (BID) | ORAL | Status: DC

## 2019-07-29 MED ORDER — CHLORHEXIDINE GLUCONATE 0.12 % MT SOLN
15.0000 mL | Freq: Once | OROMUCOSAL | Status: AC
  Administered 2019-07-29: 06:00:00 15 mL via OROMUCOSAL
  Filled 2019-07-29: qty 15

## 2019-07-29 MED ORDER — ACETAMINOPHEN 160 MG/5ML PO SOLN: 1000.0000 mg | Freq: Four times a day (QID) | ORAL | Status: DC

## 2019-07-29 MED ORDER — THROMBIN 20000 UNITS EX SOLR
OROMUCOSAL | Status: DC | PRN
  Administered 2019-07-29: 08:00:00 4 mL via TOPICAL

## 2019-07-29 MED ORDER — NITROGLYCERIN 0.2 MG/ML ON CALL CATH LAB
INTRAVENOUS | Status: DC | PRN
  Administered 2019-07-29: 40 ug via INTRAVENOUS

## 2019-07-29 MED ORDER — 0.9 % SODIUM CHLORIDE (POUR BTL) OPTIME
TOPICAL | Status: DC | PRN
  Administered 2019-07-29: 6000 mL

## 2019-07-29 MED ORDER — PANTOPRAZOLE SODIUM 40 MG PO TBEC
40.0000 mg | DELAYED_RELEASE_TABLET | Freq: Every day | ORAL | Status: DC
  Administered 2019-07-31: 40 mg via ORAL
  Filled 2019-07-29: qty 1

## 2019-07-29 MED ORDER — LACTATED RINGERS IV SOLN: 500.0000 mL | Freq: Once | INTRAVENOUS | Status: DC | PRN

## 2019-07-29 MED ORDER — PROTAMINE SULFATE 10 MG/ML IV SOLN
INTRAVENOUS | Status: AC
  Filled 2019-07-29: qty 25

## 2019-07-29 MED ORDER — LACTATED RINGERS IV SOLN: INTRAVENOUS | Status: DC

## 2019-07-29 MED ORDER — LEVOTHYROXINE SODIUM 100 MCG PO TABS
200.0000 ug | ORAL_TABLET | Freq: Every day | ORAL | Status: DC
  Administered 2019-07-30 – 2019-08-02 (×4): 200 ug via ORAL
  Filled 2019-07-29 (×3): qty 2
  Filled 2019-07-29: qty 1
  Filled 2019-07-29: qty 2

## 2019-07-29 MED ORDER — FENTANYL CITRATE (PF) 250 MCG/5ML IJ SOLN
INTRAMUSCULAR | Status: DC | PRN
  Administered 2019-07-29: 50 ug via INTRAVENOUS
  Administered 2019-07-29: 100 ug via INTRAVENOUS
  Administered 2019-07-29 (×2): 50 ug via INTRAVENOUS
  Administered 2019-07-29 (×3): 100 ug via INTRAVENOUS
  Administered 2019-07-29: 50 ug via INTRAVENOUS
  Administered 2019-07-29 (×2): 100 ug via INTRAVENOUS
  Administered 2019-07-29 (×4): 50 ug via INTRAVENOUS
  Administered 2019-07-29: 100 ug via INTRAVENOUS
  Administered 2019-07-29: 50 ug via INTRAVENOUS
  Administered 2019-07-29 (×3): 100 ug via INTRAVENOUS
  Administered 2019-07-29: 50 ug via INTRAVENOUS

## 2019-07-29 MED ORDER — PHENYLEPHRINE HCL-NACL 20-0.9 MG/250ML-% IV SOLN: 0.0000 ug/min | INTRAVENOUS | Status: DC

## 2019-07-29 MED ORDER — PLASMA-LYTE 148 IV SOLN
INTRAVENOUS | Status: DC | PRN
  Administered 2019-07-29: 500 mL via INTRAVASCULAR

## 2019-07-29 MED ORDER — SODIUM CHLORIDE 0.9 % IV SOLN
INTRAVENOUS | Status: DC | PRN
  Administered 2019-07-29: 14:00:00 via INTRAVENOUS

## 2019-07-29 MED ORDER — PHENYLEPHRINE 40 MCG/ML (10ML) SYRINGE FOR IV PUSH (FOR BLOOD PRESSURE SUPPORT)
PREFILLED_SYRINGE | INTRAVENOUS | Status: AC
  Filled 2019-07-29: qty 10

## 2019-07-29 MED ORDER — LIDOCAINE 2% (20 MG/ML) 5 ML SYRINGE
INTRAMUSCULAR | Status: AC
  Filled 2019-07-29: qty 5

## 2019-07-29 MED ORDER — TRANEXAMIC ACID 1000 MG/10ML IV SOLN
1.5000 mg/kg/h | INTRAVENOUS | Status: DC
  Filled 2019-07-29: qty 25

## 2019-07-29 MED ORDER — CHLORHEXIDINE GLUCONATE CLOTH 2 % EX PADS
6.0000 | MEDICATED_PAD | Freq: Every day | CUTANEOUS | Status: DC
  Administered 2019-07-29 – 2019-07-30 (×2): 6 via TOPICAL

## 2019-07-29 MED ORDER — PHENYLEPHRINE 40 MCG/ML (10ML) SYRINGE FOR IV PUSH (FOR BLOOD PRESSURE SUPPORT)
PREFILLED_SYRINGE | INTRAVENOUS | Status: DC | PRN
  Administered 2019-07-29: 80 ug via INTRAVENOUS
  Administered 2019-07-29: 40 ug via INTRAVENOUS

## 2019-07-29 MED ORDER — ORAL CARE MOUTH RINSE
15.0000 mL | OROMUCOSAL | Status: DC
  Administered 2019-07-29 – 2019-07-30 (×6): 15 mL via OROMUCOSAL

## 2019-07-29 MED ORDER — METOPROLOL TARTRATE 5 MG/5ML IV SOLN
2.5000 mg | INTRAVENOUS | Status: DC | PRN
  Administered 2019-07-30: 03:00:00 5 mg via INTRAVENOUS
  Filled 2019-07-29: qty 5

## 2019-07-29 MED ORDER — ACETAMINOPHEN 160 MG/5ML PO SOLN: 650.0000 mg | Freq: Once | ORAL | Status: AC

## 2019-07-29 MED ORDER — LACTATED RINGERS IV SOLN
INTRAVENOUS | Status: DC | PRN
  Administered 2019-07-29: 07:00:00 via INTRAVENOUS

## 2019-07-29 MED ORDER — PROPOFOL 10 MG/ML IV BOLUS
INTRAVENOUS | Status: DC | PRN
  Administered 2019-07-29 (×2): 100 mg via INTRAVENOUS
  Administered 2019-07-29: 30 mg via INTRAVENOUS
  Administered 2019-07-29: 40 mg via INTRAVENOUS
  Administered 2019-07-29: 30 mg via INTRAVENOUS

## 2019-07-29 MED ORDER — SODIUM CHLORIDE 0.9% FLUSH
3.0000 mL | Freq: Two times a day (BID) | INTRAVENOUS | Status: DC
  Administered 2019-07-30 – 2019-07-31 (×3): 3 mL via INTRAVENOUS

## 2019-07-29 MED ORDER — CHLORHEXIDINE GLUCONATE 4 % EX LIQD: 30.0000 mL | CUTANEOUS | Status: DC

## 2019-07-29 MED ORDER — MORPHINE SULFATE (PF) 2 MG/ML IV SOLN
1.0000 mg | INTRAVENOUS | Status: DC | PRN
  Administered 2019-07-29: 2 mg via INTRAVENOUS
  Administered 2019-07-30 (×4): 4 mg via INTRAVENOUS
  Administered 2019-07-30: 2 mg via INTRAVENOUS
  Administered 2019-07-30 – 2019-07-31 (×6): 4 mg via INTRAVENOUS
  Filled 2019-07-29 (×10): qty 2
  Filled 2019-07-29: qty 1
  Filled 2019-07-29: qty 2

## 2019-07-29 MED ORDER — MIDAZOLAM HCL (PF) 10 MG/2ML IJ SOLN
INTRAMUSCULAR | Status: AC
  Filled 2019-07-29: qty 2

## 2019-07-29 MED ORDER — BISACODYL 5 MG PO TBEC
10.0000 mg | DELAYED_RELEASE_TABLET | Freq: Every day | ORAL | Status: DC
  Administered 2019-07-30 – 2019-07-31 (×2): 10 mg via ORAL
  Filled 2019-07-29 (×2): qty 2

## 2019-07-29 MED ORDER — FENTANYL CITRATE (PF) 250 MCG/5ML IJ SOLN
INTRAMUSCULAR | Status: AC
  Filled 2019-07-29: qty 20

## 2019-07-29 MED ORDER — VANCOMYCIN HCL IN DEXTROSE 1-5 GM/200ML-% IV SOLN
1000.0000 mg | Freq: Once | INTRAVENOUS | Status: AC
  Administered 2019-07-29: 1000 mg via INTRAVENOUS
  Filled 2019-07-29: qty 200

## 2019-07-29 MED ORDER — ACETAMINOPHEN 500 MG PO TABS
1000.0000 mg | ORAL_TABLET | Freq: Four times a day (QID) | ORAL | Status: DC
  Administered 2019-07-29 – 2019-07-31 (×6): 1000 mg via ORAL
  Filled 2019-07-29 (×6): qty 2

## 2019-07-29 MED ORDER — SERTRALINE HCL 100 MG PO TABS
100.0000 mg | ORAL_TABLET | Freq: Every day | ORAL | Status: DC
  Administered 2019-07-30 – 2019-08-01 (×4): 100 mg via ORAL
  Filled 2019-07-29 (×4): qty 1

## 2019-07-29 MED ORDER — ARTIFICIAL TEARS OPHTHALMIC OINT
TOPICAL_OINTMENT | OPHTHALMIC | Status: DC | PRN
  Administered 2019-07-29: 1 via OPHTHALMIC

## 2019-07-29 MED ORDER — MIDAZOLAM HCL 5 MG/5ML IJ SOLN
INTRAMUSCULAR | Status: DC | PRN
  Administered 2019-07-29 (×2): 1 mg via INTRAVENOUS
  Administered 2019-07-29: 3 mg via INTRAVENOUS
  Administered 2019-07-29 (×2): 1 mg via INTRAVENOUS
  Administered 2019-07-29: 3 mg via INTRAVENOUS

## 2019-07-29 MED ORDER — POTASSIUM CHLORIDE 10 MEQ/50ML IV SOLN: 10.0000 meq | INTRAVENOUS | Status: AC

## 2019-07-29 MED ORDER — ASPIRIN 81 MG PO CHEW
324.0000 mg | CHEWABLE_TABLET | Freq: Every day | ORAL | Status: DC
  Filled 2019-07-29: qty 4

## 2019-07-29 MED ORDER — BISACODYL 10 MG RE SUPP: 10.0000 mg | Freq: Every day | RECTAL | Status: DC

## 2019-07-29 MED ORDER — METOPROLOL TARTRATE 12.5 MG HALF TABLET
12.5000 mg | ORAL_TABLET | Freq: Once | ORAL | Status: DC
  Filled 2019-07-29: qty 1

## 2019-07-29 MED ORDER — DEXMEDETOMIDINE HCL IN NACL 400 MCG/100ML IV SOLN
0.0000 ug/kg/h | INTRAVENOUS | Status: DC
  Administered 2019-07-29: 17:00:00 0.4 ug/kg/h via INTRAVENOUS
  Filled 2019-07-29: qty 100

## 2019-07-29 MED ORDER — HEPARIN SODIUM (PORCINE) 1000 UNIT/ML IJ SOLN
INTRAMUSCULAR | Status: DC | PRN
  Administered 2019-07-29: 35000 [IU] via INTRAVENOUS

## 2019-07-29 MED ORDER — NITROGLYCERIN IN D5W 200-5 MCG/ML-% IV SOLN: 0.0000 ug/min | INTRAVENOUS | Status: DC

## 2019-07-29 MED ORDER — ALBUMIN HUMAN 5 % IV SOLN
250.0000 mL | INTRAVENOUS | Status: DC | PRN
  Administered 2019-07-29 (×2): 12.5 g via INTRAVENOUS

## 2019-07-29 MED ORDER — METOPROLOL TARTRATE 12.5 MG HALF TABLET: 12.5000 mg | ORAL_TABLET | Freq: Two times a day (BID) | ORAL | Status: DC

## 2019-07-29 MED ORDER — INSULIN REGULAR(HUMAN) IN NACL 100-0.9 UT/100ML-% IV SOLN: INTRAVENOUS | Status: DC

## 2019-07-29 MED ORDER — THROMBIN 20000 UNITS EX SOLR
CUTANEOUS | Status: DC | PRN
  Administered 2019-07-29: 08:00:00 20000 [IU] via TOPICAL

## 2019-07-29 MED ORDER — LACTATED RINGERS IV SOLN
INTRAVENOUS | Status: DC | PRN
  Administered 2019-07-29 (×2): via INTRAVENOUS

## 2019-07-29 MED ORDER — PROPOFOL 10 MG/ML IV BOLUS
INTRAVENOUS | Status: AC
  Filled 2019-07-29: qty 20

## 2019-07-29 MED ORDER — SODIUM CHLORIDE 0.9 % IV SOLN: 250.0000 mL | INTRAVENOUS | Status: DC

## 2019-07-29 MED ORDER — ROCURONIUM BROMIDE 10 MG/ML (PF) SYRINGE
PREFILLED_SYRINGE | INTRAVENOUS | Status: AC
  Filled 2019-07-29: qty 10

## 2019-07-29 MED ORDER — DOCUSATE SODIUM 100 MG PO CAPS
200.0000 mg | ORAL_CAPSULE | Freq: Every day | ORAL | Status: DC
  Administered 2019-07-30 – 2019-07-31 (×2): 200 mg via ORAL
  Filled 2019-07-29 (×2): qty 2

## 2019-07-29 MED ORDER — ARTIFICIAL TEARS OPHTHALMIC OINT
TOPICAL_OINTMENT | OPHTHALMIC | Status: AC
  Filled 2019-07-29: qty 3.5

## 2019-07-29 MED ORDER — THROMBIN (RECOMBINANT) 20000 UNITS EX SOLR
CUTANEOUS | Status: AC
  Filled 2019-07-29: qty 20000

## 2019-07-29 MED ORDER — SODIUM CHLORIDE 0.9 % IV SOLN
1.5000 g | Freq: Two times a day (BID) | INTRAVENOUS | Status: AC
  Administered 2019-07-29 – 2019-07-31 (×4): 1.5 g via INTRAVENOUS
  Filled 2019-07-29 (×5): qty 1.5

## 2019-07-29 MED ORDER — ROCURONIUM BROMIDE 10 MG/ML (PF) SYRINGE
PREFILLED_SYRINGE | INTRAVENOUS | Status: DC | PRN
  Administered 2019-07-29 (×3): 40 mg via INTRAVENOUS
  Administered 2019-07-29: 60 mg via INTRAVENOUS
  Administered 2019-07-29: 40 mg via INTRAVENOUS
  Administered 2019-07-29: 60 mg via INTRAVENOUS

## 2019-07-29 MED ORDER — CHLORHEXIDINE GLUCONATE 0.12 % MT SOLN
15.0000 mL | OROMUCOSAL | Status: AC
  Administered 2019-07-29: 16:00:00 15 mL via OROMUCOSAL

## 2019-07-29 MED ORDER — ACETAMINOPHEN 650 MG RE SUPP
650.0000 mg | Freq: Once | RECTAL | Status: AC
  Administered 2019-07-29: 650 mg via RECTAL

## 2019-07-29 MED ORDER — THROMBIN 20000 UNITS EX SOLR
OROMUCOSAL | Status: DC | PRN
  Administered 2019-07-29: 14:00:00 4 mL via TOPICAL

## 2019-07-29 MED ORDER — OXYCODONE HCL 5 MG PO TABS
5.0000 mg | ORAL_TABLET | ORAL | Status: DC | PRN
  Administered 2019-07-29 – 2019-07-30 (×4): 10 mg via ORAL
  Administered 2019-07-30: 5 mg via ORAL
  Administered 2019-07-31: 10 mg via ORAL
  Filled 2019-07-29: qty 2
  Filled 2019-07-29: qty 1
  Filled 2019-07-29 (×4): qty 2

## 2019-07-29 MED ORDER — SODIUM CHLORIDE 0.9% FLUSH: 3.0000 mL | INTRAVENOUS | Status: DC | PRN

## 2019-07-29 MED ORDER — FAMOTIDINE IN NACL 20-0.9 MG/50ML-% IV SOLN
20.0000 mg | Freq: Two times a day (BID) | INTRAVENOUS | Status: AC
  Administered 2019-07-29: 20 mg via INTRAVENOUS
  Filled 2019-07-29: qty 50

## 2019-07-29 MED ORDER — INSULIN REGULAR BOLUS VIA INFUSION
0.0000 [IU] | Freq: Three times a day (TID) | INTRAVENOUS | Status: DC
  Filled 2019-07-29: qty 10

## 2019-07-29 MED ORDER — SODIUM CHLORIDE 0.45 % IV SOLN: INTRAVENOUS | Status: DC | PRN

## 2019-07-29 MED ORDER — DEXMEDETOMIDINE HCL IN NACL 200 MCG/50ML IV SOLN
INTRAVENOUS | Status: AC
  Filled 2019-07-29: qty 50

## 2019-07-29 MED ORDER — MAGNESIUM SULFATE 4 GM/100ML IV SOLN
4.0000 g | Freq: Once | INTRAVENOUS | Status: AC
  Administered 2019-07-29: 16:00:00 4 g via INTRAVENOUS
  Filled 2019-07-29: qty 100

## 2019-07-29 MED ORDER — ONDANSETRON HCL 4 MG/2ML IJ SOLN
4.0000 mg | Freq: Four times a day (QID) | INTRAMUSCULAR | Status: DC | PRN
  Administered 2019-07-30 (×2): 4 mg via INTRAVENOUS
  Filled 2019-07-29 (×2): qty 2

## 2019-07-29 MED ORDER — SODIUM CHLORIDE 0.9 % IV SOLN
INTRAVENOUS | Status: DC
  Administered 2019-07-29: 16:00:00 via INTRAVENOUS

## 2019-07-29 MED ORDER — ALBUMIN HUMAN 5 % IV SOLN
INTRAVENOUS | Status: DC | PRN
  Administered 2019-07-29: 14:00:00 via INTRAVENOUS

## 2019-07-29 MED ORDER — EPHEDRINE 5 MG/ML INJ
INTRAVENOUS | Status: AC
  Filled 2019-07-29: qty 10

## 2019-07-29 MED ORDER — CHLORHEXIDINE GLUCONATE 0.12% ORAL RINSE (MEDLINE KIT)
15.0000 mL | Freq: Two times a day (BID) | OROMUCOSAL | Status: DC
  Administered 2019-07-29 – 2019-08-02 (×3): 15 mL via OROMUCOSAL

## 2019-07-29 MED ORDER — ASPIRIN EC 325 MG PO TBEC
325.0000 mg | DELAYED_RELEASE_TABLET | Freq: Every day | ORAL | Status: DC
  Administered 2019-07-30 – 2019-07-31 (×2): 325 mg via ORAL
  Filled 2019-07-29 (×2): qty 1

## 2019-07-29 MED ORDER — HEMOSTATIC AGENTS (NO CHARGE) OPTIME
TOPICAL | Status: DC | PRN
  Administered 2019-07-29: 1 via TOPICAL

## 2019-07-29 MED ORDER — ATORVASTATIN CALCIUM 40 MG PO TABS
40.0000 mg | ORAL_TABLET | Freq: Every day | ORAL | Status: DC
  Administered 2019-07-30 – 2019-08-01 (×3): 40 mg via ORAL
  Filled 2019-07-29 (×4): qty 1

## 2019-07-29 MED ORDER — PROTAMINE SULFATE 10 MG/ML IV SOLN
INTRAVENOUS | Status: DC | PRN
  Administered 2019-07-29: 300 mg via INTRAVENOUS

## 2019-07-29 MED ORDER — MIDAZOLAM HCL 2 MG/2ML IJ SOLN: 2.0000 mg | INTRAMUSCULAR | Status: DC | PRN

## 2019-07-29 SURGICAL SUPPLY — 99 items
ADAPTER CARDIO PERF ANTE/RETRO (ADAPTER) ×4 IMPLANT
APPLICATOR TIP COSEAL (VASCULAR PRODUCTS) IMPLANT
ATTRACTOMAT 16X20 MAGNETIC DRP (DRAPES) ×4 IMPLANT
BAG DECANTER FOR FLEXI CONT (MISCELLANEOUS) ×4 IMPLANT
BLADE CLIPPER SURG (BLADE) ×4 IMPLANT
BLADE CORE FAN STRYKER (BLADE) ×4 IMPLANT
BLADE STERNUM SYSTEM 6 (BLADE) ×4 IMPLANT
BLADE SURG 15 STRL LF DISP TIS (BLADE) ×4 IMPLANT
BLADE SURG 15 STRL SS (BLADE) ×4
CANISTER SUCT 3000ML PPV (MISCELLANEOUS) ×4 IMPLANT
CANNULA ARTERIAL NVNT 3/8 22FR (MISCELLANEOUS) ×4 IMPLANT
CANNULA GUNDRY RCSP 15FR (MISCELLANEOUS) ×4 IMPLANT
CANNULA SUMP PERICARDIAL (CANNULA) ×4 IMPLANT
CATH HEART VENT LEFT (CATHETERS) ×2 IMPLANT
CATH ROBINSON RED A/P 18FR (CATHETERS) ×12 IMPLANT
CATH THORACIC 36FR (CATHETERS) ×4 IMPLANT
CATH THORACIC 36FR RT ANG (CATHETERS) ×4 IMPLANT
CONT SPEC 4OZ CLIKSEAL STRL BL (MISCELLANEOUS) ×4 IMPLANT
COVER SURGICAL LIGHT HANDLE (MISCELLANEOUS) ×8 IMPLANT
COVER WAND RF STERILE (DRAPES) ×4 IMPLANT
DRAPE CARDIOVASCULAR INCISE (DRAPES) ×2
DRAPE SLUSH/WARMER DISC (DRAPES) IMPLANT
DRAPE SRG 135X102X78XABS (DRAPES) ×2 IMPLANT
DRSG COVADERM 4X14 (GAUZE/BANDAGES/DRESSINGS) ×4 IMPLANT
ELECT CAUTERY BLADE 6.4 (BLADE) ×4 IMPLANT
ELECT REM PT RETURN 9FT ADLT (ELECTROSURGICAL) ×8
ELECTRODE REM PT RTRN 9FT ADLT (ELECTROSURGICAL) ×4 IMPLANT
GAUZE SPONGE 4X4 12PLY STRL (GAUZE/BANDAGES/DRESSINGS) ×4 IMPLANT
GLOVE BIO SURGEON STRL SZ 6 (GLOVE) ×4 IMPLANT
GLOVE BIO SURGEON STRL SZ 6.5 (GLOVE) IMPLANT
GLOVE BIO SURGEON STRL SZ7 (GLOVE) IMPLANT
GLOVE BIO SURGEON STRL SZ7.5 (GLOVE) IMPLANT
GLOVE BIO SURGEONS STRL SZ 6.5 (GLOVE)
GLOVE BIOGEL PI IND STRL 6 (GLOVE) ×14 IMPLANT
GLOVE BIOGEL PI IND STRL 6.5 (GLOVE) ×6 IMPLANT
GLOVE BIOGEL PI IND STRL 7.5 (GLOVE) ×4 IMPLANT
GLOVE BIOGEL PI INDICATOR 6 (GLOVE) ×14
GLOVE BIOGEL PI INDICATOR 6.5 (GLOVE) ×6
GLOVE BIOGEL PI INDICATOR 7.5 (GLOVE) ×4
GLOVE EUDERMIC 7 POWDERFREE (GLOVE) ×8 IMPLANT
GLOVE INDICATOR 7.5 STRL GRN (GLOVE) ×8 IMPLANT
GLOVE SURG SS PI 7.0 STRL IVOR (GLOVE) ×16 IMPLANT
GLOVE SURG SS PI 7.5 STRL IVOR (GLOVE) ×4 IMPLANT
GOWN STRL REUS W/ TWL LRG LVL3 (GOWN DISPOSABLE) ×16 IMPLANT
GOWN STRL REUS W/ TWL XL LVL3 (GOWN DISPOSABLE) ×2 IMPLANT
GOWN STRL REUS W/TWL LRG LVL3 (GOWN DISPOSABLE) ×16
GOWN STRL REUS W/TWL XL LVL3 (GOWN DISPOSABLE) ×2
GRAFT WOVEN D/V 24DX30L (Vascular Products) ×4 IMPLANT
HEMOSTAT POWDER SURGIFOAM 1G (HEMOSTASIS) ×12 IMPLANT
HEMOSTAT SURGICEL 2X14 (HEMOSTASIS) ×4 IMPLANT
KIT BASIN OR (CUSTOM PROCEDURE TRAY) ×4 IMPLANT
KIT CATH CPB BARTLE (MISCELLANEOUS) ×4 IMPLANT
KIT SUCTION CATH 14FR (SUCTIONS) ×4 IMPLANT
KIT TURNOVER KIT B (KITS) ×4 IMPLANT
LINE VENT (MISCELLANEOUS) ×4 IMPLANT
NS IRRIG 1000ML POUR BTL (IV SOLUTION) ×24 IMPLANT
PACK E OPEN HEART (SUTURE) ×4 IMPLANT
PACK OPEN HEART (CUSTOM PROCEDURE TRAY) ×4 IMPLANT
PAD ARMBOARD 7.5X6 YLW CONV (MISCELLANEOUS) ×8 IMPLANT
POSITIONER HEAD DONUT 9IN (MISCELLANEOUS) ×4 IMPLANT
SEALANT SURG COSEAL 8ML (VASCULAR PRODUCTS) ×4 IMPLANT
SET CARDIOPLEGIA MPS 5001102 (MISCELLANEOUS) ×4 IMPLANT
SPONGE LAP 18X18 RF (DISPOSABLE) ×8 IMPLANT
SUT BONE WAX W31G (SUTURE) ×4 IMPLANT
SUT ETHIBON 2 0 V 52N 30 (SUTURE) ×8 IMPLANT
SUT ETHIBON EXCEL 2-0 V-5 (SUTURE) IMPLANT
SUT ETHIBOND 2 0 SH (SUTURE) ×4
SUT ETHIBOND 2 0 SH 36X2 (SUTURE) ×4 IMPLANT
SUT ETHIBOND 4 0 RB 1 (SUTURE) ×16 IMPLANT
SUT ETHIBOND V-5 VALVE (SUTURE) IMPLANT
SUT PROLENE 3 0 SH 1 (SUTURE) ×4 IMPLANT
SUT PROLENE 3 0 SH DA (SUTURE) ×4 IMPLANT
SUT PROLENE 4 0 RB 1 (SUTURE) ×10
SUT PROLENE 4-0 RB1 .5 CRCL 36 (SUTURE) ×10 IMPLANT
SUT PROLENE 5 0 C 1 36 (SUTURE) ×8 IMPLANT
SUT PROLENE 5 0 RB 2 (SUTURE) ×8 IMPLANT
SUT STEEL 6MS V (SUTURE) IMPLANT
SUT STEEL STERNAL CCS#1 18IN (SUTURE) IMPLANT
SUT STEEL SZ 6 DBL 3X14 BALL (SUTURE) IMPLANT
SUT VIC AB 1 CTX 36 (SUTURE) ×4
SUT VIC AB 1 CTX36XBRD ANBCTR (SUTURE) ×4 IMPLANT
SUT VIC AB 2-0 CT1 27 (SUTURE)
SUT VIC AB 2-0 CT1 TAPERPNT 27 (SUTURE) IMPLANT
SUT VIC AB 3-0 X1 27 (SUTURE) IMPLANT
SYSTEM SAHARA CHEST DRAIN ATS (WOUND CARE) ×4 IMPLANT
TAPE CLOTH SURG 4X10 WHT LF (GAUZE/BANDAGES/DRESSINGS) ×4 IMPLANT
TAPE PAPER 2X10 WHT MICROPORE (GAUZE/BANDAGES/DRESSINGS) ×4 IMPLANT
TOWEL GREEN STERILE (TOWEL DISPOSABLE) ×4 IMPLANT
TOWEL GREEN STERILE FF (TOWEL DISPOSABLE) ×4 IMPLANT
TRAY FOLEY SLVR 14FR TEMP STAT (SET/KITS/TRAYS/PACK) ×4 IMPLANT
TRAY FOLEY SLVR 16FR TEMP STAT (SET/KITS/TRAYS/PACK) ×4 IMPLANT
TUBE CONNECTING 12'X1/4 (SUCTIONS) ×1
TUBE CONNECTING 12X1/4 (SUCTIONS) ×3 IMPLANT
TUBE SUCT INTRACARD DLP 20F (MISCELLANEOUS) ×4 IMPLANT
UNDERPAD 30X30 (UNDERPADS AND DIAPERS) ×4 IMPLANT
VALVE AORTIC SZ 21 (Prosthesis & Implant Heart) ×4 IMPLANT
VENT LEFT HEART 12002 (CATHETERS) ×4
WATER STERILE IRR 1000ML POUR (IV SOLUTION) ×8 IMPLANT
YANKAUER SUCT BULB TIP NO VENT (SUCTIONS) ×4 IMPLANT

## 2019-07-29 NOTE — Anesthesia Procedure Notes (Signed)
Arterial Line Insertion Start/End9/08/2019 7:10 AM Performed by: Janene Harvey, CRNA, CRNA  Patient location: Pre-op. Preanesthetic checklist: patient identified, IV checked, site marked, risks and benefits discussed, surgical consent, monitors and equipment checked, pre-op evaluation, timeout performed and anesthesia consent Lidocaine 1% used for infiltration Left, radial was placed Catheter size: 20 G Hand hygiene performed  and maximum sterile barriers used   Attempts: 1 Procedure performed without using ultrasound guided technique. Following insertion, dressing applied and Biopatch. Post procedure assessment: normal and unchanged  Patient tolerated the procedure well with no immediate complications.

## 2019-07-29 NOTE — Progress Notes (Signed)
EVENING ROUNDS NOTE :     [address]       [place] [address]             [phone]                 Day of Surgery Procedure(s) (LRB): REDO STERNOTOMY (N/A) ASCENDING AORTIC PORCINE ROOT REPLACEMENT USING MEDTRONIC FREESTYLE AORTIC ROOT SIZE 37mm and HEMOSHIELD STRAIGHT PLATINUM GRAFT SIZE 24 (N/A) TRANSESOPHAGEAL ECHOCARDIOGRAM (TEE) (N/A)   Total Length of Stay:  LOS: 0 days  Events:  Waking up, following commands.  Weaning gtts. Fast track    BP 104/71   Pulse 80   Temp 97.9 F (36.6 C)   Resp 15   Ht 6' (1.829 m)   Wt 110 kg   SpO2 97%   BMI 32.90 kg/m   PAP: (7-38)/(2-17) 25/14 CO:  [4.5 L/min] 4.5 L/min CI:  [2 L/min/m2] 2 L/min/m2  Vent Mode: SIMV;PRVC;PSV FiO2 (%):  [50 %] 50 % Set Rate:  [12 bmp] 12 bmp Vt Set:  RW:212346 mL] 620 mL PEEP:  [5 cmH20] 5 cmH20 Pressure Support:  [10 cmH20] 10 cmH20  . sodium chloride 20 mL/hr at 07/29/19 1800  . [START ON 07/30/2019] sodium chloride    . sodium chloride 10 mL/hr at 07/29/19 1623  . albumin human 12.5 g (07/29/19 1613)  . cefUROXime (ZINACEF)  IV    . dexmedetomidine (PRECEDEX) IV infusion 0.2 mcg/kg/hr (07/29/19 1800)  . famotidine (PEPCID) IV Stopped (07/29/19 1634)  . insulin 1.7 mL/hr at 07/29/19 1800  . lactated ringers    . lactated ringers Stopped (07/29/19 1623)  . lactated ringers 20 mL/hr at 07/29/19 1800  . magnesium sulfate 20 mL/hr at 07/29/19 1800  . nitroGLYCERIN Stopped (07/29/19 1613)  . phenylephrine (NEO-SYNEPHRINE) Adult infusion 15 mcg/min (07/29/19 1800)  . potassium chloride    . vancomycin      No intake/output data recorded.   CBC Latest Ref Rng & Units 07/29/2019 07/29/2019 07/29/2019  WBC 4.0 - 10.5 K/uL 17.6(H) - -  Hemoglobin 13.0 - 17.0 g/dL 12.3(L) 10.5(L) 9.9(L)  Hematocrit 39.0 - 52.0 % 38.6(L) 31.0(L) 29.0(L)  Platelets 150 - 400 K/uL 110(L) - -    BMP Latest Ref Rng & Units 07/29/2019 07/29/2019 07/29/2019  Glucose 70 - 99 mg/dL 115(H) 130(H) -   BUN 6 - 20 mg/dL - - -  Creatinine 0.61 - 1.24 mg/dL - - -  Sodium 135 - 145 mmol/L 140 137 136  Potassium 3.5 - 5.1 mmol/L 3.6 3.7 3.8  Chloride 98 - 111 mmol/L - - -  CO2 22 - 32 mmol/L - - -  Calcium 8.9 - 10.3 mg/dL - - -    ABG    Component Value Date/Time   PHART 7.438 07/29/2019 1240   PCO2ART 45.4 07/29/2019 1240   PO2ART 348.0 (H) 07/29/2019 1240   HCO3 30.7 (H) 07/29/2019 1240   TCO2 32 07/29/2019 1240   O2SAT 100.0 07/29/2019 Dulce, MD 07/29/2019 6:26 PM

## 2019-07-29 NOTE — Interval H&P Note (Signed)
History and Physical Interval Note:  07/29/2019 7:24 AM  Henry Sheppard  has presented today for surgery, with the diagnosis of SEVERE PROTHETIC AORTIC VALVE STENOSIS.  The various methods of treatment have been discussed with the patient and family. After consideration of risks, benefits and other options for treatment, the patient has consented to  Procedure(s): REDO STERNOTOMY (N/A) ASCENDING AORTIC PORCINE ROOT REPLACEMENT (N/A) TRANSESOPHAGEAL ECHOCARDIOGRAM (TEE) (N/A) as a surgical intervention.  The patient's history has been reviewed, patient examined, no change in status, stable for surgery.  I have reviewed the patient's chart and labs.  Questions were answered to the patient's satisfaction.     [name]

## 2019-07-29 NOTE — Progress Notes (Signed)
  Echocardiogram Echocardiogram Transesophageal has been performed.  Bobbye Charleston 07/29/2019, 9:07 AM

## 2019-07-29 NOTE — Op Note (Signed)
CARDIOVASCULAR SURGERY OPERATIVE NOTE  07/29/2019  Surgeon:  [name], MD  First Assistant: Enid Cutter, MD   Preoperative Diagnosis:  Severe prosthetic aortic valve stenosis   Postoperative Diagnosis:  Same   Procedure:  1. Redo median Sternotomy 2. Extracorporeal circulation 4.   Aortic root replacement using a 21 mm Medtronic Freestyle aortic bioprosthesis  Anesthesia:  General Endotracheal  Anesthesiologist: Perfecto Kingdom, MD  Clinical History/Surgical Indication:  This 53 year old gentleman appears to have stage D, severe prosthetic aortic valve stenosis with a mean gradient of 56 mmHg by TEE, 41 mmHg by cardiac catheterization and 30 mmHg by 2D echocardiogram. I have personally reviewed his TEE images and the Edwards pericardial valve appears to be functioning appropriately with normal leaflet motion and no significant pannus. 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 He has signs of diastolic congestive heart failure with exertional shortness of breath and lower extremity edema as well as elevated LVEDP and pulmonary artery pressures. He has a heavy smoking history until 12/2018 and has significant COPD with his arterial blood gas during his catheterization and preop showed resting hypoxemia and hypercarbia with. This is likely contributing to his SOB. He is also morbidly obese which is likely contributing to his shortness of breath and patient- prosthesis mismatch. I think the only option for resolving this problem is redo sternotomy with removal of his aortic valve prosthesis and aortic root replacement using a porcine root. This valve could still degenerate over time and require further intervention but he could probably have a TAVR inside of a porcine root if needed and still have an adequately sized valve. It would not be possible to improve this situation by placing another  prosthetic valve in the same annulus. His aortic valve prosthesis appears to be functioning normally and I do not think a valve in valve TAVR would improve things either. 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 I think it is still the best long-term option for him. I discussed the operative procedure with the patient and family including alternatives, benefits and risks; including but not limited to bleeding, blood transfusion, infection, stroke, myocardial infarction, valve failure, heart block requiring a permanent pacemaker, organ dysfunction, and death.  Lillia Pauls understands and agrees to proceed.   Pre-bypass TEE:  The aortic valve prosthesis had a mean gradient of 31 mm Hg while under anesthesia. One of the leaflets did not appear to be moving normally. There was moderate AI that may be perivalvular. There was mild MR. LV systolic function was normal.   Preparation:  The patient was seen in the preoperative holding area and the correct patient, correct operation were confirmed with the patient after reviewing the medical record and catheterization. The consent was signed by me. Preoperative antibiotics were given. A pulmonary arterial line and radial arterial line were placed by the anesthesia team. The patient was taken back to the operating room and positioned supine on the operating room table. After being placed under general endotracheal anesthesia by the anesthesia team a foley catheter was placed. The neck, chest,  abdomen, and both legs were prepped with betadine soap and solution and draped in the usual sterile manner. A surgical time-out was  taken and the correct patient and operative procedure were confirmed with the nursing and anesthesia staff.  Redo median sternotomy:  A median sternotomy incision was made.  The sternal wires were removed.  Respirations were held and the sternum was opened using the oscillating saw without difficulty.  Respirations were resumed.  The sternal edges were retracted with bone hooks and the heart was dissected from the posterior aspect of the sternum without difficulty.  There were moderately dense adhesions.  These adhesions were divided to expose the right atrium and ascending aorta.  The old cannulation site on the ascending aorta was easily identified and there was still accessible proximal aortic arch beyond this for cannulation.  Cardiopulmonary Bypass:  The patient was heparinized. The proximal aortic arch just beyond the innominate artery was cannulated using a 22 French metal-tipped right angle cannula.  The right atrium was cannulated using a large two-stage venous cannula.  An aortic root cardioplegia/vent cannula was placed in the mid ascending aorta.  The patient was placed on cardiopulmonary bypass.  The pericardial adhesions were dissected further to expose the right superior pulmonary vein and the proximal coronary sinus to allow palpation of a coronary sinus catheter.  I did not dissect any of the adhesions over the left ventricle but I did expose the mid LAD so that a myocardial temperature needle could be placed into the septum.   A left ventricular vent was placed via the right superior pulmonary vein and a retrograde cardioplegia cannula was placed via the right atrium and advanced into the coronary sinus.  Aortic occlusion was performed with a single cross-clamp. Systemic cooling to 32 degrees Centigrade and topical cooling of the heart with iced saline were used. Hyperkalemic antegrade cold blood cardioplegia was used to induce diastolic arrest and was then retrograde cold blood  cardioplegia was given at about 20 minute intervals throughout the period of arrest to maintain myocardial temperature at or below 10 degrees centigrade. A temperature probe was inserted into the interventricular septum. CO2 was insufflated into the pericardium throughout the case to minimize intracardiac air.   Aortic root replacement:   The ascending aorta was transected above the STJ. Examination of the aortic root showed that the previous aortic valve prosthesis had calcification of the right coronary cusp with complete immobility.  There was a small tear in the free edge of the left coronary cusp.  There was no visible paravalvular leak.  Examination of the coronary arteries showed the left main coronary artery was widely patent with a normal ostium.  The ostium of the right coronary artery was patent but there was significant calcification around the ostium superiorly.  The right and left coronary arteries were removed from the aortic root with a button of aortic wall around the ostia. They were retracted carefully out of the way with stay sutures to prevent rotation. The old pericardial valve was removed without difficulty. The annulus along the left coronary sinus was partially calcified and this extended down into the mitral valve.  There was adequate tissue to allow suture placement.  I sized the annulus and a 21 mm Medtronic Freestyle sizer barely fit and a 23 mm sizer did not fit.. I felt that the best option for this patient was a 21 mm Medtronic Freestyle bioprosthesis.  A series of 4-0 Ethibond simple sutures were placed around the annulus, keeping  the sutures in a single plane. A 21 mm Medtronic Freestyle bioprosthesis ( SN [id number] ) was prepared. The sutures were placed through the base of the Freestyle root taking care not to injure the valve leaflets. The bioprosthesis was lowered into place. The sutures were tied sequentially over an autologous pericardial strip. The porcine left coronary  stump was excised and the patient's left coronary button was anastomosed to it in an end to end manner using continuous 5-0 prolene suture. The porcine right coronary stump was excised and the right coronary button anastomosed in a similar manner.   A light coating of CoSeal was applied to each anastomosis for hemostasis.  It was necessary to use an interposition graft to join the porcine root with the patient's mid ascending aorta.  The porcine aorta was measured and a 24 mm Hemashield Platinum vascular graft ( REF [id number], lot [id number], SN [id number]) was anastomosed to the distal end of the porcine bioprosthesis in end-to-end manner using continuous 4-0 Prolene suture.  The Dacron graft was cut to the appropriate length and anastomosed to the patient's ascending aorta in end to end manner using continuous 4-0 prolene suture. CoSeal was applied to seal the needle holes in the grafts.  Deairing maneuvers were performed and the bed placed in trendelenburg position.   Completion:  The patient was rewarmed to 37 degrees Centigrade. The crossclamp was removed with a time of 222 minutes. There was spontaneous return of ventricular fibrillation and the patient was defibrillated to sinus rhythm. The vascular anastomoses all appeared hemostatic. Two temporary epicardial pacing wires were placed on the right atrium and two on the right ventricle. The patient was weaned from CPB without difficulty on no inotropes. CPB time was 254 minutes. Cardiac output was 5 LPM. TEE showed normal porcine aortic valve function with no AI.  The mean gradient was measured at 15 mmHg.  There was unchanged mild MR. LV and RV function was normal. Heparin was fully reversed with protamine and the cannulas removed.  Hemostasis was achieved. Mediastinal  drainage tubes were placed. The sternum was closed with double #6 stainless steel wires. The fascia was closed with continuous # 1 vicryl suture. The subcutaneous tissue was closed with  2-0 vicryl continuous suture. The skin was closed with 3-0 vicryl subcuticular suture. The right groin incision was closed in layers in a similar manner.  All sponge, needle, and instrument counts were reported correct at the end of the case. Dry sterile dressings were placed over the incisions and around the chest tubes which were connected to pleurevac suction. The patient was then transported to the surgical intensive care unit in stable condition.

## 2019-07-29 NOTE — Anesthesia Procedure Notes (Signed)
Procedure Name: Intubation Date/Time: 07/29/2019 7:54 AM Performed by: Janene Harvey, CRNA Pre-anesthesia Checklist: Patient identified, Emergency Drugs available, Suction available and Patient being monitored Patient Re-evaluated:Patient Re-evaluated prior to induction Oxygen Delivery Method: Circle system utilized Preoxygenation: Pre-oxygenation with 100% oxygen Induction Type: IV induction Ventilation: Mask ventilation without difficulty Laryngoscope Size: Mac, Glidescope and 3 Grade View: Grade I Tube type: Oral Tube size: 8.0 mm Number of attempts: 1 Airway Equipment and Method: Stylet and Oral airway Placement Confirmation: ETT inserted through vocal cords under direct vision,  positive ETCO2 and breath sounds checked- equal and bilateral Secured at: 22 cm Tube secured with: Tape Dental Injury: Teeth and Oropharynx as per pre-operative assessment  Comments: Elective glidescope, pt with hx of neck radiation

## 2019-07-29 NOTE — Progress Notes (Signed)
Per patient request, wallet taken to wife in lobby, Henry Sheppard.  Henry Sheppard stated that she would be leaving the hospital during his procedure but can be reached at [phone]

## 2019-07-29 NOTE — Transfer of Care (Signed)
Immediate Anesthesia Transfer of Care Note  Patient: Henry Sheppard  Procedure(s) Performed: REDO STERNOTOMY (N/A Chest) ASCENDING AORTIC PORCINE ROOT REPLACEMENT USING MEDTRONIC FREESTYLE AORTIC ROOT SIZE 54mm and HEMOSHIELD STRAIGHT PLATINUM GRAFT SIZE 24 (N/A Chest) TRANSESOPHAGEAL ECHOCARDIOGRAM (TEE) (N/A )  Patient Location: ICU  Anesthesia Type:General  Level of Consciousness: sedated and Patient remains intubated per anesthesia plan  Airway & Oxygen Therapy: Patient remains intubated per anesthesia plan and Patient placed on Ventilator (see vital sign flow sheet for setting)  Post-op Assessment: Report given to RN and Post -op Vital signs reviewed and stable  Post vital signs: Reviewed and stable  Last Vitals:  Vitals Value Taken Time  BP    Temp    Pulse    Resp    SpO2      Last Pain:  Vitals:   07/29/19 0603  TempSrc: Oral  PainSc: 0-No pain         Complications: No apparent anesthesia complications

## 2019-07-29 NOTE — Anesthesia Procedure Notes (Signed)
Central Venous Catheter Insertion Performed by: Murvin Natal, MD, anesthesiologist Start/End9/08/2019 7:05 AM, 07/29/2019 7:15 AM Patient location: Pre-op. Preanesthetic checklist: patient identified, IV checked, site marked, risks and benefits discussed, surgical consent, monitors and equipment checked, pre-op evaluation, timeout performed and anesthesia consent Position: Trendelenburg Lidocaine 1% used for infiltration and patient sedated Hand hygiene performed , maximum sterile barriers used  and Seldinger technique used Catheter size: 8.5 Fr Total catheter length 10. PA cath was placed.Sheath introducer Swan type:thermodilution PA Cath depth:46 Procedure performed using ultrasound guided technique. Ultrasound Notes:anatomy identified, needle tip was noted to be adjacent to the nerve/plexus identified, no ultrasound evidence of intravascular and/or intraneural injection and image(s) printed for medical record Attempts: 1 Following insertion, line sutured, dressing applied and Biopatch. Post procedure assessment: blood return through all ports, free fluid flow and no air  Patient tolerated the procedure well with no immediate complications.

## 2019-07-29 NOTE — Brief Op Note (Addendum)
07/29/2019  1:47 PM  PATIENT:  Henry Sheppard  53 y.o. male  PRE-OPERATIVE DIAGNOSIS:  SEVERE PROTHETIC AORTIC VALVE STENOSIS  POST-OPERATIVE DIAGNOSIS:  SEVERE PROTHETIC AORTIC VALVE STENOSIS  PROCEDURE:  Procedure(s): REDO STERNOTOMY (N/A) ASCENDING AORTIC PORCINE ROOT REPLACEMENT USING MEDTRONIC FREESTYLE AORTIC ROOT (N/A) TRANSESOPHAGEAL ECHOCARDIOGRAM (TEE) (N/A)  SURGEON:  Bartle, Fernande Boyden, MD   PHYSICIAN ASSISTANT: Javanna Patin  ANESTHESIA:   general  EBL:  Per anesthesia and perfusion records   BLOOD ADMINISTERED:none  DRAINS: Mediastinal tubes   LOCAL MEDICATIONS USED:  NONE  SPECIMEN:  Source of Specimen:  Explanted prosthetic aortic valve  DISPOSITION OF SPECIMEN:  PATHOLOGY  COUNTS:  YES  DICTATION: .Dragon Dictation  PLAN OF CARE: Admit to inpatient   PATIENT DISPOSITION:  ICU - intubated and hemodynamically stable.   Delay start of Pharmacological VTE agent (>24hrs) due to surgical blood loss or risk of bleeding: yes

## 2019-07-29 NOTE — Anesthesia Postprocedure Evaluation (Signed)
Anesthesia Post Note  Patient: NAASIR NEWCOMER  Procedure(s) Performed: REDO STERNOTOMY (N/A Chest) ASCENDING AORTIC PORCINE ROOT REPLACEMENT USING MEDTRONIC FREESTYLE AORTIC ROOT SIZE 12mm and HEMOSHIELD STRAIGHT PLATINUM GRAFT SIZE 24 (N/A Chest) TRANSESOPHAGEAL ECHOCARDIOGRAM (TEE) (N/A )     Patient location during evaluation: SICU Anesthesia Type: General Level of consciousness: sedated Pain management: pain level controlled Vital Signs Assessment: post-procedure vital signs reviewed and stable Respiratory status: patient remains intubated per anesthesia plan Cardiovascular status: stable Postop Assessment: no apparent nausea or vomiting Anesthetic complications: no    Last Vitals:  Vitals:   07/29/19 1609 07/29/19 1630  BP:    Pulse:    Resp: 18 15  Temp: 36.5 C 36.6 C  SpO2: 95% 97%    Last Pain:  Vitals:   07/29/19 0603  TempSrc: Oral  PainSc: 0-No pain                 Sherrol Vicars P Ronold [place]grove

## 2019-07-29 NOTE — Progress Notes (Signed)
Attempted to call wife Apolonio Schneiders with update as patient has been extubated. Call went straight to voicemail. Will attempt again later.  Lucius Conn, RN

## 2019-07-29 NOTE — Procedures (Signed)
Extubation Procedure Note  Patient Details:   Name: OLSEN KUBICK DOB: [birth year] MRN: [id number]   Airway Documentation:    Vent end date: 07/29/19 Vent end time: 2115   Evaluation  O2 sats: stable throughout Complications: No apparent complications Patient did tolerate procedure well. Bilateral Breath Sounds: Clear, Diminished   Yes   Pt tolerated rapid wean protocol, VC 980mL NIF -20, positive for cuff leak, extubated to 4L [place]. No stridor or dyspnea noted post extubation. RT Will continue to monitor.   Mariam Dollar 07/29/2019, 9:59 PM

## 2019-07-30 ENCOUNTER — Inpatient Hospital Stay (HOSPITAL_COMMUNITY): Payer: Medicare Other

## 2019-07-30 ENCOUNTER — Encounter (HOSPITAL_COMMUNITY): Payer: Self-pay | Admitting: Surgery

## 2019-07-30 LAB — CBC
HCT: 40.9 % (ref 39.0–52.0)
HCT: 42.6 % (ref 39.0–52.0)
Hemoglobin: 12.9 g/dL — ABNORMAL LOW (ref 13.0–17.0)
Hemoglobin: 13.6 g/dL (ref 13.0–17.0)
MCH: 30.6 pg (ref 26.0–34.0)
MCH: 31.1 pg (ref 26.0–34.0)
MCHC: 31.5 g/dL (ref 30.0–36.0)
MCHC: 31.9 g/dL (ref 30.0–36.0)
MCV: 97.1 fL (ref 80.0–100.0)
MCV: 97.3 fL (ref 80.0–100.0)
Platelets: 127 10*3/uL — ABNORMAL LOW (ref 150–400)
Platelets: 126 10*3/uL — ABNORMAL LOW (ref 150–400)
RBC: 4.21 MIL/uL — ABNORMAL LOW (ref 4.22–5.81)
RBC: 4.38 MIL/uL (ref 4.22–5.81)
RDW: 14.9 % (ref 11.5–15.5)
RDW: 14.7 % (ref 11.5–15.5)
WBC: 18.5 10*3/uL — ABNORMAL HIGH (ref 4.0–10.5)
WBC: 17.3 10*3/uL — ABNORMAL HIGH (ref 4.0–10.5)
nRBC: 0 % (ref 0.0–0.2)
nRBC: 0 % (ref 0.0–0.2)

## 2019-07-30 LAB — GLUCOSE, CAPILLARY
Glucose-Capillary: 126 mg/dL — ABNORMAL HIGH (ref 70–99)
Glucose-Capillary: 128 mg/dL — ABNORMAL HIGH (ref 70–99)
Glucose-Capillary: 145 mg/dL — ABNORMAL HIGH (ref 70–99)
Glucose-Capillary: 145 mg/dL — ABNORMAL HIGH (ref 70–99)
Glucose-Capillary: 150 mg/dL — ABNORMAL HIGH (ref 70–99)
Glucose-Capillary: 168 mg/dL — ABNORMAL HIGH (ref 70–99)
Glucose-Capillary: 193 mg/dL — ABNORMAL HIGH (ref 70–99)

## 2019-07-30 LAB — POCT I-STAT 7, (LYTES, BLD GAS, ICA,H+H)
Bicarbonate: 25.1 mmol/L (ref 20.0–28.0)
Calcium, Ion: 1.17 mmol/L (ref 1.15–1.40)
HCT: 37 % — ABNORMAL LOW (ref 39.0–52.0)
Hemoglobin: 12.6 g/dL — ABNORMAL LOW (ref 13.0–17.0)
O2 Saturation: 94 %
Patient temperature: 37.7
Potassium: 4 mmol/L (ref 3.5–5.1)
Sodium: 140 mmol/L (ref 135–145)
TCO2: 26 mmol/L (ref 22–32)
pCO2 arterial: 44.3 mmHg (ref 32.0–48.0)
pH, Arterial: 7.365 (ref 7.350–7.450)
pO2, Arterial: 76 mmHg — ABNORMAL LOW (ref 83.0–108.0)

## 2019-07-30 LAB — BASIC METABOLIC PANEL
Anion gap: 8 (ref 5–15)
Anion gap: 5 (ref 5–15)
BUN: 6 mg/dL (ref 6–20)
BUN: <5 mg/dL — ABNORMAL LOW (ref 6–20)
CO2: 26 mmol/L (ref 22–32)
CO2: 31 mmol/L (ref 22–32)
Calcium: 8.1 mg/dL — ABNORMAL LOW (ref 8.9–10.3)
Calcium: 8.4 mg/dL — ABNORMAL LOW (ref 8.9–10.3)
Chloride: 104 mmol/L (ref 98–111)
Chloride: 102 mmol/L (ref 98–111)
Creatinine, Ser: 0.62 mg/dL (ref 0.61–1.24)
Creatinine, Ser: 0.56 mg/dL — ABNORMAL LOW (ref 0.61–1.24)
GFR calc Af Amer: >60 mL/min (ref >60)
GFR calc Af Amer: >60 mL/min (ref >60)
GFR calc non Af Amer: >60 mL/min (ref >60)
GFR calc non Af Amer: >60 mL/min (ref >60)
Glucose, Bld: 141 mg/dL — ABNORMAL HIGH (ref 70–99)
Glucose, Bld: 158 mg/dL — ABNORMAL HIGH (ref 70–99)
Potassium: 3.9 mmol/L (ref 3.5–5.1)
Potassium: 3.9 mmol/L (ref 3.5–5.1)
Sodium: 138 mmol/L (ref 135–145)
Sodium: 138 mmol/L (ref 135–145)

## 2019-07-30 LAB — MAGNESIUM
Magnesium: 2.1 mg/dL (ref 1.7–2.4)
Magnesium: 2.3 mg/dL (ref 1.7–2.4)

## 2019-07-30 MED ORDER — ORAL CARE MOUTH RINSE: 15.0000 mL | Freq: Two times a day (BID) | OROMUCOSAL | Status: DC

## 2019-07-30 MED ORDER — CARVEDILOL 6.25 MG PO TABS
6.2500 mg | ORAL_TABLET | Freq: Two times a day (BID) | ORAL | Status: DC
  Administered 2019-07-30 – 2019-08-02 (×7): 6.25 mg via ORAL
  Filled 2019-07-30 (×7): qty 1

## 2019-07-30 MED ORDER — INSULIN ASPART 100 UNIT/ML ~~LOC~~ SOLN
0.0000 [IU] | SUBCUTANEOUS | Status: DC
  Administered 2019-07-30: 20:00:00 2 [IU] via SUBCUTANEOUS
  Administered 2019-07-30: 4 [IU] via SUBCUTANEOUS
  Administered 2019-07-30 – 2019-07-31 (×2): 2 [IU] via SUBCUTANEOUS

## 2019-07-30 MED ORDER — INSULIN ASPART 100 UNIT/ML ~~LOC~~ SOLN: 0.0000 [IU] | SUBCUTANEOUS | Status: DC

## 2019-07-30 MED ORDER — CLONAZEPAM 1 MG PO TABS
1.0000 mg | ORAL_TABLET | Freq: Every day | ORAL | Status: DC
  Administered 2019-07-30 – 2019-08-01 (×3): 1 mg via ORAL
  Filled 2019-07-30 (×3): qty 1

## 2019-07-30 MED ORDER — LOSARTAN POTASSIUM 25 MG PO TABS
25.0000 mg | ORAL_TABLET | Freq: Every day | ORAL | Status: DC
  Administered 2019-07-30 – 2019-08-02 (×4): 25 mg via ORAL
  Filled 2019-07-30 (×5): qty 1

## 2019-07-30 MED ORDER — BUDESONIDE 0.25 MG/2ML IN SUSP
0.2500 mg | Freq: Two times a day (BID) | RESPIRATORY_TRACT | Status: DC
  Administered 2019-07-30 – 2019-08-02 (×7): 0.25 mg via RESPIRATORY_TRACT
  Filled 2019-07-30 (×7): qty 2

## 2019-07-30 MED ORDER — ENOXAPARIN SODIUM 40 MG/0.4ML ~~LOC~~ SOLN
40.0000 mg | Freq: Every day | SUBCUTANEOUS | Status: DC
  Administered 2019-07-30 – 2019-08-01 (×3): 40 mg via SUBCUTANEOUS
  Filled 2019-07-30 (×3): qty 0.4

## 2019-07-30 MED ORDER — NICARDIPINE HCL IN NACL 20-0.86 MG/200ML-% IV SOLN
3.0000 mg/h | INTRAVENOUS | Status: DC
  Administered 2019-07-30: 15 mg/h via INTRAVENOUS
  Administered 2019-07-30: 10:00:00 12.5 mg/h via INTRAVENOUS
  Administered 2019-07-30: 05:00:00 5 mg/h via INTRAVENOUS
  Administered 2019-07-30: 15 mg/h via INTRAVENOUS
  Filled 2019-07-30 (×7): qty 200

## 2019-07-30 MED ORDER — UMECLIDINIUM BROMIDE 62.5 MCG/INH IN AEPB
1.0000 | INHALATION_SPRAY | Freq: Every day | RESPIRATORY_TRACT | Status: DC
  Administered 2019-07-30 – 2019-07-31 (×2): 1 via RESPIRATORY_TRACT
  Filled 2019-07-30 (×2): qty 7

## 2019-07-30 MED ORDER — KETOROLAC TROMETHAMINE 15 MG/ML IJ SOLN
15.0000 mg | Freq: Four times a day (QID) | INTRAMUSCULAR | Status: DC | PRN
  Administered 2019-07-30 – 2019-08-02 (×8): 15 mg via INTRAVENOUS
  Filled 2019-07-30 (×8): qty 1

## 2019-07-30 MED ORDER — POTASSIUM CHLORIDE CRYS ER 20 MEQ PO TBCR
20.0000 meq | EXTENDED_RELEASE_TABLET | Freq: Two times a day (BID) | ORAL | Status: AC
  Administered 2019-07-30 (×2): 20 meq via ORAL
  Filled 2019-07-30 (×2): qty 1

## 2019-07-30 MED ORDER — LEVALBUTEROL HCL 0.63 MG/3ML IN NEBU
0.6300 mg | INHALATION_SOLUTION | Freq: Four times a day (QID) | RESPIRATORY_TRACT | Status: DC
  Administered 2019-07-30 (×2): 0.63 mg via RESPIRATORY_TRACT
  Filled 2019-07-30 (×3): qty 3

## 2019-07-30 MED FILL — Thrombin (Recombinant) For Soln 20000 Unit: CUTANEOUS | Qty: 1 | Status: AC

## 2019-07-30 NOTE — Progress Notes (Signed)
Pt BP 183/80 map 110 despite nitro gtt, metoprolol and multiple pain medications. MD verbal order for Cardene gtt started at 5 and titrated for SBP<140. Will administer and monitor closely.  Lucius Conn, RN

## 2019-07-30 NOTE — Progress Notes (Signed)
Assisted tele visit to patient with wife.  Henry Sheppard, Philis Nettle, RN

## 2019-07-30 NOTE — Progress Notes (Signed)
Patient ID: Henry Sheppard, male   DOB: 05-03-1966, 53 y.o.   MRN: [id number] TCTS Evening Rounds:  Hemodynamically stable in sinus rhythm.  sats 97% on 4L [place]  Good urine output.  Feels much better up in chair. Ambulated around the ICU.  CBC    Component Value Date/Time   WBC 17.3 (H) 07/30/2019 1612   RBC 4.38 07/30/2019 1612   HGB 13.6 07/30/2019 1612   HCT 42.6 07/30/2019 1612   PLT 126 (L) 07/30/2019 1612   MCV 97.3 07/30/2019 1612   MCH 31.1 07/30/2019 1612   MCHC 31.9 07/30/2019 1612   RDW 14.7 07/30/2019 1612    BMET pending

## 2019-07-30 NOTE — Progress Notes (Signed)
1 Day Post-Op Procedure(s) (LRB): REDO STERNOTOMY (N/A) ASCENDING AORTIC PORCINE ROOT REPLACEMENT USING MEDTRONIC FREESTYLE AORTIC ROOT SIZE 34mm and HEMOSHIELD STRAIGHT PLATINUM GRAFT SIZE 24 (N/A) TRANSESOPHAGEAL ECHOCARDIOGRAM (TEE) (N/A) Subjective: Only complaint is of his chronic back pain. Wants to get up to chair and walk. Usually sleeps sitting up in chair.  Objective: Vital signs in last 24 hours: Temp:  [97.7 F (36.5 C)-100.4 F (38 C)] 99.3 F (37.4 C) (09/11 0700) Pulse Rate:  [80-85] 85 (09/11 0015) Cardiac Rhythm: Atrial paced (09/10 1545) Resp:  [12-32] 19 (09/11 0700) BP: (101-149)/(54-88) 130/72 (09/11 0700) SpO2:  [90 %-100 %] 90 % (09/11 0700) Arterial Line BP: (102-173)/(49-78) 145/56 (09/11 0700) FiO2 (%):  [40 %-50 %] 40 % (09/10 2100) Weight:  [110.9 kg] 110.9 kg (09/11 0148)  Hemodynamic parameters for last 24 hours: PAP: (22-43)/(10-24) 31/17 CO:  [4 L/min-7.7 L/min] 7.7 L/min CI:  [1.7 L/min/m2-3.3 L/min/m2] 3.3 L/min/m2  Intake/Output from previous day: 09/10 0701 - 09/11 0700 In: 7263.6 [P.O.:70; I.V.:4201.4; Blood:1117; IV Piggyback:1875.2] Out: [id number] [Urine:5870; Blood:800; Chest Tube:307] Intake/Output this shift: No intake/output data recorded.  General appearance: alert and cooperative Neurologic: intact Heart: regular rate and rhythm, S1, S2 normal, no murmur Lungs: clear to auscultation bilaterally Extremities: extremities normal, minimal edema. Wound: dressing dry  Lab Results: Recent Labs    07/29/19 1553  07/29/19 2219 07/30/19 0510  WBC 17.6*  --   --  18.5*  HGB 12.3*   < > 12.6* 12.9*  HCT 38.6*   < > 37.0* 40.9  PLT 110*  --   --  127*   < > = values in this interval not displayed.   BMET:  Recent Labs    07/27/19 1220  07/29/19 1420  07/29/19 2219 07/30/19 0510  NA 139   < > 140   < > 140 138  K 4.1   < > 3.6   < > 4.0 3.9  CL 105  --   --   --   --  104  CO2 27  --   --   --   --  26  GLUCOSE 89   < > 115*   --   --  141*  BUN 15  --   --   --   --  6  CREATININE 0.57*  --   --   --   --  0.62  CALCIUM 8.9  --   --   --   --  8.1*   < > = values in this interval not displayed.    PT/INR:  Recent Labs    07/29/19 1553  LABPROT 15.3*  INR 1.2   ABG    Component Value Date/Time   PHART 7.365 07/29/2019 2219   HCO3 25.1 07/29/2019 2219   TCO2 26 07/29/2019 2219   O2SAT 94.0 07/29/2019 2219   CBG (last 3)  Recent Labs    07/29/19 2057 07/30/19 0032 07/30/19 0608  GLUCAP 116* 128* 145*   CXR: ok  ECG: sinus, LBBB (old)  Assessment/Plan: S/P Procedure(s) (LRB): REDO STERNOTOMY (N/A) ASCENDING AORTIC PORCINE ROOT REPLACEMENT USING MEDTRONIC FREESTYLE AORTIC ROOT SIZE 60mm and HEMOSHIELD STRAIGHT PLATINUM GRAFT SIZE 24 (N/A) TRANSESOPHAGEAL ECHOCARDIOGRAM (TEE) (N/A)  POD 1 Hemodynamically stable. Started on Cardene drip overnight for hypertension. Will resume his Coreg and Losartan, wean off Cardene.  Excellent spontaneous diuresis. Replace K+  DM: preop Hgb A1c was 6.3 on no meds. Will start CBG's and SSI. Glucose should be better as  outpatient with valve fixed if he continues to watch diet and can get more exercise.  DC swan, arterial line and chest tubes.  Severe COPD with baseline hypercarbia and hypoxemia on RA: resume pulmonary meds, encourage IS, mobilization.   LOS: 1 day    [name] 07/30/2019

## 2019-07-30 NOTE — Progress Notes (Signed)
Assisted pt  with tele video with wife.   Lucius Conn, RN

## 2019-07-30 NOTE — Addendum Note (Signed)
Addendum  created 07/30/19 0714 by Josephine Igo, CRNA   Order list changed

## 2019-07-31 ENCOUNTER — Inpatient Hospital Stay (HOSPITAL_COMMUNITY): Payer: Medicare Other

## 2019-07-31 LAB — BASIC METABOLIC PANEL
Anion gap: 7 (ref 5–15)
BUN: 6 mg/dL (ref 6–20)
CO2: 29 mmol/L (ref 22–32)
Calcium: 8.6 mg/dL — ABNORMAL LOW (ref 8.9–10.3)
Chloride: 101 mmol/L (ref 98–111)
Creatinine, Ser: 0.58 mg/dL — ABNORMAL LOW (ref 0.61–1.24)
GFR calc Af Amer: >60 mL/min (ref >60)
GFR calc non Af Amer: >60 mL/min (ref >60)
Glucose, Bld: 118 mg/dL — ABNORMAL HIGH (ref 70–99)
Potassium: 3.9 mmol/L (ref 3.5–5.1)
Sodium: 137 mmol/L (ref 135–145)

## 2019-07-31 LAB — GLUCOSE, CAPILLARY
Glucose-Capillary: 127 mg/dL — ABNORMAL HIGH (ref 70–99)
Glucose-Capillary: 145 mg/dL — ABNORMAL HIGH (ref 70–99)

## 2019-07-31 LAB — CBC
HCT: 38.7 % — ABNORMAL LOW (ref 39.0–52.0)
Hemoglobin: 12.1 g/dL — ABNORMAL LOW (ref 13.0–17.0)
MCH: 30.9 pg (ref 26.0–34.0)
MCHC: 31.3 g/dL (ref 30.0–36.0)
MCV: 98.7 fL (ref 80.0–100.0)
Platelets: 121 10*3/uL — ABNORMAL LOW (ref 150–400)
RBC: 3.92 MIL/uL — ABNORMAL LOW (ref 4.22–5.81)
RDW: 14.8 % (ref 11.5–15.5)
WBC: 17.6 10*3/uL — ABNORMAL HIGH (ref 4.0–10.5)
nRBC: 0 % (ref 0.0–0.2)

## 2019-07-31 MED ORDER — ONDANSETRON HCL 4 MG PO TABS: 4.0000 mg | ORAL_TABLET | Freq: Four times a day (QID) | ORAL | Status: DC | PRN

## 2019-07-31 MED ORDER — ALBUTEROL SULFATE HFA 108 (90 BASE) MCG/ACT IN AERS: 2.0000 | INHALATION_SPRAY | Freq: Four times a day (QID) | RESPIRATORY_TRACT | Status: DC | PRN

## 2019-07-31 MED ORDER — SODIUM CHLORIDE 0.9 % IV SOLN: 250.0000 mL | INTRAVENOUS | Status: DC | PRN

## 2019-07-31 MED ORDER — PANTOPRAZOLE SODIUM 40 MG PO TBEC
40.0000 mg | DELAYED_RELEASE_TABLET | Freq: Every day | ORAL | Status: DC
  Administered 2019-08-02: 40 mg via ORAL
  Filled 2019-07-31 (×2): qty 1

## 2019-07-31 MED ORDER — SODIUM CHLORIDE 0.9% FLUSH
3.0000 mL | Freq: Two times a day (BID) | INTRAVENOUS | Status: DC
  Administered 2019-07-31 – 2019-08-02 (×4): 3 mL via INTRAVENOUS

## 2019-07-31 MED ORDER — POTASSIUM CHLORIDE CRYS ER 20 MEQ PO TBCR
20.0000 meq | EXTENDED_RELEASE_TABLET | Freq: Two times a day (BID) | ORAL | Status: AC
  Administered 2019-07-31 (×2): 20 meq via ORAL
  Filled 2019-07-31 (×2): qty 1

## 2019-07-31 MED ORDER — ASPIRIN EC 325 MG PO TBEC
325.0000 mg | DELAYED_RELEASE_TABLET | Freq: Every day | ORAL | Status: DC
  Administered 2019-08-01 – 2019-08-02 (×2): 325 mg via ORAL
  Filled 2019-07-31 (×2): qty 1

## 2019-07-31 MED ORDER — LEVALBUTEROL HCL 0.63 MG/3ML IN NEBU: 0.6300 mg | INHALATION_SOLUTION | Freq: Four times a day (QID) | RESPIRATORY_TRACT | Status: DC | PRN

## 2019-07-31 MED ORDER — CHLORHEXIDINE GLUCONATE CLOTH 2 % EX PADS: 6.0000 | MEDICATED_PAD | Freq: Every day | CUTANEOUS | Status: DC

## 2019-07-31 MED ORDER — CHLORHEXIDINE GLUCONATE CLOTH 2 % EX PADS
6.0000 | MEDICATED_PAD | Freq: Every day | CUTANEOUS | Status: DC
  Administered 2019-07-31: 6 via TOPICAL

## 2019-07-31 MED ORDER — ONDANSETRON HCL 4 MG/2ML IJ SOLN: 4.0000 mg | Freq: Four times a day (QID) | INTRAMUSCULAR | Status: DC | PRN

## 2019-07-31 MED ORDER — LEVALBUTEROL HCL 0.63 MG/3ML IN NEBU: 0.6300 mg | INHALATION_SOLUTION | Freq: Three times a day (TID) | RESPIRATORY_TRACT | Status: DC

## 2019-07-31 MED ORDER — OXYCODONE HCL 5 MG PO TABS: 5.0000 mg | ORAL_TABLET | ORAL | Status: DC | PRN

## 2019-07-31 MED ORDER — MOVING RIGHT ALONG BOOK
Freq: Once | Status: AC
  Administered 2019-07-31: 16:00:00
  Filled 2019-07-31: qty 1

## 2019-07-31 MED ORDER — DOCUSATE SODIUM 100 MG PO CAPS
200.0000 mg | ORAL_CAPSULE | Freq: Every day | ORAL | Status: DC
  Filled 2019-07-31 (×2): qty 2

## 2019-07-31 MED ORDER — SODIUM CHLORIDE 0.9% FLUSH: 3.0000 mL | INTRAVENOUS | Status: DC | PRN

## 2019-07-31 MED ORDER — ALBUTEROL SULFATE (2.5 MG/3ML) 0.083% IN NEBU: 2.5000 mg | INHALATION_SOLUTION | Freq: Four times a day (QID) | RESPIRATORY_TRACT | Status: DC | PRN

## 2019-07-31 NOTE — Progress Notes (Signed)
Assisted tele visit to patient with family member.  Daelynn Blower Ann, RN  

## 2019-07-31 NOTE — Plan of Care (Signed)

## 2019-07-31 NOTE — Progress Notes (Signed)
2 Days Post-Op Procedure(s) (LRB): REDO STERNOTOMY (N/A) ASCENDING AORTIC PORCINE ROOT REPLACEMENT USING MEDTRONIC FREESTYLE AORTIC ROOT SIZE 73mm and HEMOSHIELD STRAIGHT PLATINUM GRAFT SIZE 24 (N/A) TRANSESOPHAGEAL ECHOCARDIOGRAM (TEE) (N/A) Subjective: No complaints this am. Says he walked 3 laps this am without difficulty. Says his breathing and stamina are better than they have been for many years.  Objective: Vital signs in last 24 hours: Temp:  [97.8 F (36.6 C)-98.8 F (37.1 C)] 98.8 F (37.1 C) (09/12 0740) Cardiac Rhythm: Normal sinus rhythm (09/12 0800) Resp:  [14-22] 16 (09/12 0800) BP: (101-141)/(52-83) 139/82 (09/12 0800) SpO2:  [90 %-98 %] 95 % (09/12 0800) Arterial Line BP: (111-149)/(57-76) 136/61 (09/11 1300) Weight:  [107.2 kg] 107.2 kg (09/12 0600)  Hemodynamic parameters for last 24 hours: PAP: (32)/(16) 32/16 CO:  [9 L/min] 9 L/min CI:  [3.9 L/min/m2] 3.9 L/min/m2  Intake/Output from previous day: 09/11 0701 - 09/12 0700 In: 2286.1 [P.O.:960; I.V.:1126.1; IV Piggyback:200] Out: [id number] [Urine:3790; Chest Tube:40] Intake/Output this shift: Total I/O In: 7 [I.V.:7] Out: -   General appearance: alert and cooperative Neurologic: intact Heart: regular rate and rhythm, S1, S2 normal, no murmur Lungs: clear to auscultation bilaterally Extremities: extremities normal, no edema Wound: dressing dry  Lab Results: Recent Labs    07/30/19 1612 07/31/19 0503  WBC 17.3* 17.6*  HGB 13.6 12.1*  HCT 42.6 38.7*  PLT 126* 121*   BMET:  Recent Labs    07/30/19 1612 07/31/19 0503  NA 138 137  K 3.9 3.9  CL 102 101  CO2 31 29  GLUCOSE 158* 118*  BUN <5* 6  CREATININE 0.56* 0.58*  CALCIUM 8.4* 8.6*    PT/INR:  Recent Labs    07/29/19 1553  LABPROT 15.3*  INR 1.2   ABG    Component Value Date/Time   PHART 7.365 07/29/2019 2219   HCO3 25.1 07/29/2019 2219   TCO2 26 07/29/2019 2219   O2SAT 94.0 07/29/2019 2219   CBG (last 3)  Recent Labs   07/30/19 2320 07/31/19 0333 07/31/19 0742  GLUCAP 126* 127* 145*   CXR: clear  Assessment/Plan: S/P Procedure(s) (LRB): REDO STERNOTOMY (N/A) ASCENDING AORTIC PORCINE ROOT REPLACEMENT USING MEDTRONIC FREESTYLE AORTIC ROOT SIZE 64mm and HEMOSHIELD STRAIGHT PLATINUM GRAFT SIZE 24 (N/A) TRANSESOPHAGEAL ECHOCARDIOGRAM (TEE) (N/A)  POD 2 Hemodynamically stable in sinus rhythm on Coreg and Cozaar.   Wt is below preop so will hold off on diuretic for now.  Severe COPD on preop PFT's: Doing well postop and working on IS every hour.  DM: pre-diabetic. Glucose under adequate control. Will stop CBG's and continue CHO modified diet.  DC foley and sleeve.  Transfer to 4E when bed available.   LOS: 2 days    [name] 07/31/2019

## 2019-07-31 NOTE — Progress Notes (Signed)
Walked into room with Pulmicort treatment and patient sats were in the 80s.  Patient stated that he usually wears oxygen at home when he sleeps (2L).  Went and got supplies for his treatment and [place] and placed patient on home regimen.  Patient sat is 93% on 2L, no distress noted.

## 2019-08-01 NOTE — Progress Notes (Signed)
EPWs pulled per order.  Pt tolerated very well.  Left / ventricular wire site with moderate bleeding after removal, pressure applied and guaze dressing left on site, will monitor closely.   Pt understands bedrest for one hr with frequent VS.  CCMD notified.  Will con't plan of care.

## 2019-08-01 NOTE — Progress Notes (Addendum)
      [address]       [place] [address]             [phone]        3 Days Post-Op Procedure(s) (LRB): REDO STERNOTOMY (N/A) ASCENDING AORTIC PORCINE ROOT REPLACEMENT USING MEDTRONIC FREESTYLE AORTIC ROOT SIZE 23mm and HEMOSHIELD STRAIGHT PLATINUM GRAFT SIZE 24 (N/A) TRANSESOPHAGEAL ECHOCARDIOGRAM (TEE) (N/A)  Subjective: Patient asking for diet to be changed because cannot eat the food, "it is terrible".  Objective: Vital signs in last 24 hours: Temp:  [98.4 F (36.9 C)-99.2 F (37.3 C)] 98.7 F (37.1 C) (09/13 0437) Pulse Rate:  [86-109] 89 (09/13 0441) Cardiac Rhythm: Normal sinus rhythm;Bundle branch block (09/12 1900) Resp:  [14-21] 19 (09/13 0441) BP: (116-136)/(60-78) 117/72 (09/13 0437) SpO2:  [90 %-96 %] 91 % (09/13 0441) Weight:  [107.5 kg] 107.5 kg (09/13 0441)  Pre op weight 110 kg Current Weight  08/01/19 107.5 kg      Intake/Output from previous day: 09/12 0701 - 09/13 0700 In: 1058.9 [P.O.:960; I.V.:11.7; IV Piggyback:87.2] Out: 735 [Urine:735]   Physical Exam:  Cardiovascular: Slightly tachy Pulmonary: Slightly diminished bibasilar breath sounds Abdomen: Soft, non tender, bowel sounds present. Extremities: Tracebilateral lower extremity edema. Wounds: Dressing removed. Proximal portion of sternal wound with minor bloody ooze. No sign of infection.  Lab Results: CBC: Recent Labs    07/30/19 1612 07/31/19 0503  WBC 17.3* 17.6*  HGB 13.6 12.1*  HCT 42.6 38.7*  PLT 126* 121*   BMET:  Recent Labs    07/30/19 1612 07/31/19 0503  NA 138 137  K 3.9 3.9  CL 102 101  CO2 31 29  GLUCOSE 158* 118*  BUN <5* 6  CREATININE 0.56* 0.58*  CALCIUM 8.4* 8.6*    PT/INR:  Lab Results  Component Value Date   INR 1.2 07/29/2019   INR 1.0 07/27/2019   ABG:  INR: Will add last result for INR, ABG once components are confirmed Will add last 4 CBG results once components are confirmed  Assessment/Plan:  1. CV - ST in  the 110's this am. On Coreg 6.25 mg bid, Losartan 25 mg daily 2.  Pulmonary - Patient states uses 2 L of oxygen at night prior to surgery. Will check PA/LAT CXR for am. Encourage incentive spirometer. 3.  Acute blood loss anemia - H and H yesterday 12.1 and 38.7 4. Mild thrombocytopenia-platelets yesterday 121,000 5. Patient likely has pre diabetes (HGA1C 6.3). Will change diet to heart healthy as not eating. We discussed the importance of no sugar, low carb diet 6. Hope to discharge 1-2 days  Sharalyn Ink [facility] 08/01/2019,9:05 AM [id number]   Chart reviewed, patient examined, agree with above. He continues to progress and is walking well. Says he feels great. Rhythm stable. DC pacing wires today.  Home tomorrow or Tuesday depending on how he feels.

## 2019-08-01 NOTE — Progress Notes (Signed)
Pt ambulating hall independently.  Placed verbal to DC pacing wires per convo with Pt, confirmed with PA and MD.

## 2019-08-02 ENCOUNTER — Inpatient Hospital Stay (HOSPITAL_COMMUNITY): Payer: Medicare Other

## 2019-08-02 LAB — BPAM RBC
Blood Product Expiration Date: 202009262359
Blood Product Expiration Date: 202009262359
Blood Product Expiration Date: 202009262359
Blood Product Expiration Date: 202009262359
Blood Product Expiration Date: 202010082359
Blood Product Expiration Date: 202010082359
ISSUE DATE / TIME: 202009100746
ISSUE DATE / TIME: 202009102102
ISSUE DATE / TIME: 202009110614
ISSUE DATE / TIME: 202009111159
Unit Type and Rh: 6200
Unit Type and Rh: 6200
Unit Type and Rh: 6200
Unit Type and Rh: 6200
Unit Type and Rh: 6200
Unit Type and Rh: 6200

## 2019-08-02 LAB — TYPE AND SCREEN
ABO/RH(D): A POS
Antibody Screen: NEGATIVE
Unit division: 0
Unit division: 0
Unit division: 0
Unit division: 0
Unit division: 0
Unit division: 0

## 2019-08-02 LAB — CBC
HCT: 35 % — ABNORMAL LOW (ref 39.0–52.0)
Hemoglobin: 11.2 g/dL — ABNORMAL LOW (ref 13.0–17.0)
MCH: 31 pg (ref 26.0–34.0)
MCHC: 32 g/dL (ref 30.0–36.0)
MCV: 97 fL (ref 80.0–100.0)
Platelets: 227 10*3/uL (ref 150–400)
RBC: 3.61 MIL/uL — ABNORMAL LOW (ref 4.22–5.81)
RDW: 14.6 % (ref 11.5–15.5)
WBC: 14.5 10*3/uL — ABNORMAL HIGH (ref 4.0–10.5)
nRBC: 0 % (ref 0.0–0.2)

## 2019-08-02 MED ORDER — OXYCODONE HCL 5 MG PO TABS: 5.0000 mg | ORAL_TABLET | Freq: Four times a day (QID) | ORAL | 0 refills | Status: DC | PRN

## 2019-08-02 MED ORDER — CARVEDILOL 6.25 MG PO TABS: 6.2500 mg | ORAL_TABLET | Freq: Two times a day (BID) | ORAL | 1 refills | Status: DC

## 2019-08-02 MED ORDER — ASPIRIN 325 MG PO TBEC: 325.0000 mg | DELAYED_RELEASE_TABLET | Freq: Every day | ORAL | 0 refills | Status: DC

## 2019-08-02 NOTE — Progress Notes (Signed)
[id number] Pt has already walked several times this morning so did not walk. Discussed sternal precautions and staying in the tube, IS, walking for ex, and CRP 2. Not interested in CRP 2 referral. Prefers to ex on his own. Discussed low carb and heart healthy food choices. Congratulated pt on not smoking since April. He plans to continue to abstain . Pt has had sternal surgery before and knows importance of mobility and IS. Graylon Good RN BSN 08/02/2019 9:13 AM

## 2019-08-02 NOTE — Discharge Instructions (Signed)
Discharge Instructions:  1. You may shower, please wash incisions daily with soap and water and keep dry.  If you wish to cover wounds with dressing you may do so but please keep clean and change daily.  No tub baths or swimming until incisions have completely healed.  If your incisions become red or develop any drainage please call our office at [phone]  2. No Driving until cleared by Dr. Vivi Martens office and you are no longer using narcotic pain medications  3. Monitor your weight daily.. Please use the same scale and weigh at same time... If you gain 3-5 lbs in 48 hours with associated lower extremity swelling, please contact our office at [phone]  4. Fever of 101.5 for at least 24 hours with no source, please contact our office at [phone]  5. Activity- up as tolerated, please walk at least 3 times per day.  Avoid strenuous activity, no lifting, pushing, or pulling with your arms over 8-10 lbs for a minimum of 6 weeks  6. If any questions or concerns arise, please do not hesitate to contact our office at [phone]  Prediabetes Eating Plan Prediabetes is a condition that causes blood sugar (glucose) levels to be higher than normal. This increases the risk for developing diabetes. In order to prevent diabetes from developing, your health care provider may recommend a diet and other lifestyle changes to help you:  Control your blood glucose levels.  Improve your cholesterol levels.  Manage your blood pressure. Your health care provider may recommend working with a diet and nutrition specialist (dietitian) to make a meal plan that is best for you. What are tips for following this plan? Lifestyle  Set weight loss goals with the help of your health care team. It is recommended that most people with prediabetes lose 7% of their current body weight.  Exercise for at least 30 minutes at least 5 days a week.  Attend a support group or seek ongoing support from a mental health  counselor.  Take over-the-counter and prescription medicines only as told by your health care provider. Reading food labels  Read food labels to check the amount of fat, salt (sodium), and sugar in prepackaged foods. Avoid foods that have: ? Saturated fats. ? Trans fats. ? Added sugars.  Avoid foods that have more than 300 milligrams (mg) of sodium per serving. Limit your daily sodium intake to less than 2,300 mg each day. Shopping  Avoid buying pre-made and processed foods. Cooking  Cook with olive oil. Do not use butter, lard, or ghee.  Bake, broil, grill, or boil foods. Avoid frying. Meal planning   Work with your dietitian to develop an eating plan that is right for you. This may include: ? Tracking how many calories you take in. Use a food diary, notebook, or mobile application to track what you eat at each meal. ? Using the glycemic index (GI) to plan your meals. The index tells you how quickly a food will raise your blood glucose. Choose low-GI foods. These foods take a longer time to raise blood glucose.  Consider following a Mediterranean diet. This diet includes: ? Several servings each day of fresh fruits and vegetables. ? Eating fish at least twice a week. ? Several servings each day of whole grains, beans, nuts, and seeds. ? Using olive oil instead of other fats. ? Moderate alcohol consumption. ? Eating small amounts of red meat and whole-fat dairy.  If you have high blood pressure, you may need to limit  your sodium intake or follow a diet such as the DASH eating plan. DASH is an eating plan that aims to lower high blood pressure. What foods are recommended? The items listed below may not be a complete list. Talk with your dietitian about what dietary choices are best for you. Grains Whole grains, such as whole-wheat or whole-grain breads, crackers, cereals, and pasta. Unsweetened oatmeal. Bulgur. Barley. Quinoa. Brown rice. Corn or whole-wheat flour tortillas or  taco shells. Vegetables Lettuce. Spinach. Peas. Beets. Cauliflower. Cabbage. Broccoli. Carrots. Tomatoes. Squash. Eggplant. Herbs. Peppers. Onions. Cucumbers. Brussels sprouts. Fruits Berries. Bananas. Apples. Oranges. Grapes. Papaya. Mango. Pomegranate. Kiwi. Grapefruit. Cherries. Meats and other protein foods Seafood. Poultry without skin. Lean cuts of pork and beef. Tofu. Eggs. Nuts. Beans. Dairy Low-fat or fat-free dairy products, such as yogurt, cottage cheese, and cheese. Beverages Water. Tea. Coffee. Sugar-free or diet soda. Seltzer water. Lowfat or no-fat milk. Milk alternatives, such as soy or almond milk. Fats and oils Olive oil. Canola oil. Sunflower oil. Grapeseed oil. Avocado. Walnuts. Sweets and desserts Sugar-free or low-fat pudding. Sugar-free or low-fat ice cream and other frozen treats. Seasoning and other foods Herbs. Sodium-free spices. Mustard. Relish. Low-fat, low-sugar ketchup. Low-fat, low-sugar barbecue sauce. Low-fat or fat-free mayonnaise. What foods are not recommended? The items listed below may not be a complete list. Talk with your dietitian about what dietary choices are best for you. Grains Refined white flour and flour products, such as bread, pasta, snack foods, and cereals. Vegetables Canned vegetables. Frozen vegetables with butter or cream sauce. Fruits Fruits canned with syrup. Meats and other protein foods Fatty cuts of meat. Poultry with skin. Breaded or fried meat. Processed meats. Dairy Full-fat yogurt, cheese, or milk. Beverages Sweetened drinks, such as sweet iced tea and soda. Fats and oils Butter. Lard. Ghee. Sweets and desserts Baked goods, such as cake, cupcakes, pastries, cookies, and cheesecake. Seasoning and other foods Spice mixes with added salt. Ketchup. Barbecue sauce. Mayonnaise. Summary  To prevent diabetes from developing, you may need to make diet and other lifestyle changes to help control blood sugar, improve  cholesterol levels, and manage your blood pressure.  Set weight loss goals with the help of your health care team. It is recommended that most people with prediabetes lose 7 percent of their current body weight.  Consider following a Mediterranean diet that includes plenty of fresh fruits and vegetables, whole grains, beans, nuts, seeds, fish, lean meat, low-fat dairy, and healthy oils. This information is not intended to replace advice given to you by your health care provider. Make sure you discuss any questions you have with your health care provider. Document Released: 03/21/2015 Document Revised: 02/26/2019 Document Reviewed: 01/08/2017 Elsevier Patient Education  2020 [employer].

## 2019-08-02 NOTE — Progress Notes (Signed)
4 Days Post-Op Procedure(s) (LRB): REDO STERNOTOMY (N/A) ASCENDING AORTIC PORCINE ROOT REPLACEMENT USING MEDTRONIC FREESTYLE AORTIC ROOT SIZE 44mm and HEMOSHIELD STRAIGHT PLATINUM GRAFT SIZE 24 (N/A) TRANSESOPHAGEAL ECHOCARDIOGRAM (TEE) (N/A) Subjective: Only complaint is of back pain from his arthritis. Breathing is great. Feels like he is ready to go home.  Objective: Vital signs in last 24 hours: Temp:  [98.6 F (37 C)-99.5 F (37.5 C)] 98.7 F (37.1 C) (09/14 0552) Pulse Rate:  [81-90] 87 (09/14 0552) Cardiac Rhythm: Sinus tachycardia;Bundle branch block (09/13 1900) Resp:  [14-21] 18 (09/14 0552) BP: (108-149)/(65-85) 149/85 (09/14 0552) SpO2:  [88 %-98 %] 98 % (09/14 0552) Weight:  [106.3 kg] 106.3 kg (09/14 0552)  Hemodynamic parameters for last 24 hours:    Intake/Output from previous day: No intake/output data recorded. Intake/Output this shift: No intake/output data recorded.  General appearance: alert and cooperative Neurologic: intact Heart: regular rate and rhythm, S1, S2 normal, no murmur Lungs: clear to auscultation bilaterally Extremities: extremities normal, no edema Wound: incision ok  Lab Results: Recent Labs    07/31/19 0503 08/02/19 0604  WBC 17.6* 14.5*  HGB 12.1* 11.2*  HCT 38.7* 35.0*  PLT 121* 227   BMET:  Recent Labs    07/30/19 1612 07/31/19 0503  NA 138 137  K 3.9 3.9  CL 102 101  CO2 31 29  GLUCOSE 158* 118*  BUN <5* 6  CREATININE 0.56* 0.58*  CALCIUM 8.4* 8.6*    PT/INR: No results for input(s): LABPROT, INR in the last 72 hours. ABG    Component Value Date/Time   PHART 7.365 07/29/2019 2219   HCO3 25.1 07/29/2019 2219   TCO2 26 07/29/2019 2219   O2SAT 94.0 07/29/2019 2219   CBG (last 3)  Recent Labs    07/30/19 2320 07/31/19 0333 07/31/19 0742  GLUCAP 126* 127* 145*   CXR: clear  Assessment/Plan: S/P Procedure(s) (LRB): REDO STERNOTOMY (N/A) ASCENDING AORTIC PORCINE ROOT REPLACEMENT USING MEDTRONIC  FREESTYLE AORTIC ROOT SIZE 59mm and HEMOSHIELD STRAIGHT PLATINUM GRAFT SIZE 24 (N/A) TRANSESOPHAGEAL ECHOCARDIOGRAM (TEE) (N/A)  POD 4 Hemodynamically stable in sinus rhythm. He has not had any arrhythmia postop. Continue Coreg 6.25 bid ( only on once a day previously but needs it bid) and Losartan.  He really wants to go home today because his back is much more comfortable at home. I think that is fine.  Return for chest tube suture remove and incision check in a week.   LOS: 4 days    [name] 08/02/2019

## 2019-08-02 NOTE — Discharge Summary (Signed)
[address]       [place] [address]             [phone]      Physician Discharge Summary  Patient ID: Henry Sheppard MRN: [id number] DOB/AGE: [birth year] 53 y.o.  Admit date: 07/29/2019 Discharge date: 08/02/2019  Admission Diagnoses: Patient Active Problem List   Diagnosis Date Noted  . Coronary artery disease involving native coronary artery of native heart without angina pectoris 01/13/2019  . Leg edema 01/13/2019  . Leg cramps 01/13/2019  . Narcotic dependence, in remission ([place])   . Hyperlipidemia, group A   . Hypertension, benign   . Hodgkins lymphoma ([place])   . 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  . S/P AVR 12/21/2018  . Exertional dyspnea 12/21/2018  . Opioid dependence ([place]) 12/21/2018  . Essential hypertension 12/21/2018  . H/O echocardiogram 10/07/2018  . Other secondary pulmonary hypertension ([place]) 07/30/2018  . Heart failure ([place]) 07/30/2018  . High cholesterol 07/30/2018  . H/O endoscopy 11/18/2016  . H/O colonoscopy 11/18/2016    Discharge Diagnoses:  Active Problems:   S/P aortic valve replacement and aortoplasty   Discharged Condition: good  HPI:   The patient is a 53 year old gentleman with history of hypertension, hyperlipidemia, remote history of Hodgkin's lymphoma status post splenectomy and chest radiation at age 53, aortic stenosis status post aortic valve replacement in 2011 in [place] using a 21 mm Edwards pericardial valve, ongoing 2 pack/day cigarette smoking for many years, frequent marijuana smoking, and degenerative spine disease with chronic narcotic dependence currently on Suboxone who presents with progressive exertional shortness of breath and leg swelling. He had a TEE done on 12/22/2018 which showed a well-seated Edwards bioprosthetic valve with normal leaflet excursion and no significant thickening or calcification. The mean gradient across the prosthesis was 56 mmHg. The valve area was measured  at 1.58 cm. His most recent hemoglobin on 12/14/2018 was 14.1. An echocardiogram on 10/07/2018 reportedly showed a mean gradient of 30 mmHg with a peak gradient of 52 mmHg and a calculated valve area of 0.66 cm. Ejection fraction at that time was 45 to 50%. He underwent cardiac catheterization on 12/22/2018 which showed mild nonobstructive coronary disease with about 40% proximal RCA stenosis. Right heart catheterization showed a PA pressure of 52/24 with a mean of 35 mmHg. Wedge pressure was 22 mmHg. LVEDP was 26 mmHg. Mean right atrial pressure was 15 mmHg . The mean gradient across the aortic valve was 41.5 mmHg with a peak gradient of 48 mmHg. Aortic valve area was calculated at 1.02 cm. Cardiac output was 5.8 L/min. When I saw him initially back in February 2020 I thought that redo sternotomy for aortic root replacement in this patient would be a high risk operation complicated by the fact that the patient had prior chest radiation for Hodgkin's lymphoma and has significant degenerative spine disease with immobility of his neck and chronic narcotic dependence, as well as ongoing heavy smoking and likely severe COPD,and morbid obesity with a BMI of 35.15 kg/m. I told him that if he quit smoking for two months that I would consider surgery. He did quit smoking and had follow up PFT's which showed a severe obstructive defect and mild diffusion defect. His ABG's have shown hypercarbia and hypoxemia.   Hospital Course:   On 07/29/2019 [name] underwent a redo sternotomy and ascending aortic root replacement with Dr. Cyndia Bent. He tolerated the procedure well and was transferred to the surgical ICU  for continued care. He was extubated in a timely manner. POD 1 he had some chronic back pain. He remained on cardene overnight for hypertension. He has some spontaneous diuresis. We discontinued his swan ganz catheter, arterial line, and chest tubes. We encouraged mobilization. We resumed his pulmonary medications  since he does have a history of severe COPD. POD 2 we resumed his coreg and cozaar. His weight was below preop therefore we held off on diuretics. We discontinued the foley catheter and sleeve. He was stable to transfer to the stepdown unit for continued care. POD 3 he continued to progress. We discontinued his pacing wires. Patient has prediabetes therefore he will modify his diet accordingly. He is to get his hemoglobin A1C checked when he goes back to visit his primary care physician. Today, he is tolerating room air, ambulating with limited assistance, his incisions are healing well, and he is stable for discharge home.   Consults: None  Significant Diagnostic Studies:    Treatments:  CARDIOVASCULAR SURGERY OPERATIVE NOTE  07/29/2019  Surgeon:  [name], MD  First Assistant: Enid Cutter, MD   Preoperative Diagnosis:  Severe prosthetic aortic valve stenosis   Postoperative Diagnosis:  Same   Procedure:  1. Redo median Sternotomy 2. Extracorporeal circulation 4.   Aortic root replacement using a 21 mm Medtronic Freestyle aortic bioprosthesis  Anesthesia:  General Endotracheal  Anesthesiologist: Perfecto Kingdom, MD  Discharge Exam: Blood pressure (!) 133/92, pulse 93, temperature 98.7 F (37.1 C), temperature source Oral, resp. rate 18, height 6' (1.829 m), weight 106.3 kg, SpO2 93 %.   General appearance: alert and cooperative Neurologic: intact Heart: regular rate and rhythm, S1, S2 normal, no murmur Lungs: clear to auscultation bilaterally Extremities: extremities normal, no edema Wound: incision ok  Disposition: Discharge disposition: 01-Home or Self Care        Allergies as of 08/02/2019   No Known Allergies     Medication List    STOP taking these medications   cyclobenzaprine 10 MG tablet Commonly known as: FLEXERIL   fluticasone 110 MCG/ACT inhaler Commonly known as: FLOVENT HFA   potassium chloride 10 MEQ tablet Commonly  known as: K-DUR   potassium chloride SA 20 MEQ tablet Commonly known as: K-DUR   torsemide 20 MG tablet Commonly known as: DEMADEX     TAKE these medications   albuterol 108 (90 Base) MCG/ACT inhaler Commonly known as: VENTOLIN HFA Inhale 2 puffs into the lungs every 6 (six) hours as needed (wheezing/shortness of breath).   aspirin 325 MG EC tablet Take 1 tablet (325 mg total) by mouth daily. What changed:   medication strength  how much to take  when to take this   atorvastatin 40 MG tablet Commonly known as: LIPITOR Take 40 mg by mouth at bedtime.   carvedilol 6.25 MG tablet Commonly known as: COREG Take 1 tablet (6.25 mg total) by mouth 2 (two) times daily with a meal. What changed: when to take this   clonazePAM 1 MG tablet Commonly known as: KLONOPIN Take 1 mg by mouth at bedtime.   Fluticasone-Umeclidin-Vilant 100-62.5-25 MCG/INH Aepb Commonly known as: Trelegy Ellipta Inhale 1 puff into the lungs daily. What changed: how much to take   levothyroxine 200 MCG tablet Commonly known as: SYNTHROID Take 200 mcg by mouth daily.   losartan 25 MG tablet Commonly known as: COZAAR Take 25 mg by mouth at bedtime.   oxyCODONE 5 MG immediate release tablet Commonly known as: Oxy IR/ROXICODONE Take 1 tablet (  5 mg total) by mouth every 6 (six) hours as needed for severe pain.   sertraline 100 MG tablet Commonly known as: ZOLOFT Take 100 mg by mouth at bedtime.   Spiriva Respimat 2.5 MCG/ACT Aers Generic drug: Tiotropium Bromide Monohydrate Inhale 1 puff into the lungs daily.   Suboxone 8-2 MG Film Generic drug: Buprenorphine HCl-Naloxone HCl Place 1 Film under the tongue 2 (two) times daily.      Follow-up Information    Patwardhan, Reynold Bowen, MD. Call.   Specialties: Cardiology, Radiology Why: for a follow up appointment for 2 weeks Contact information: [place] [address] [phone]        [name], MD.  Go to.   Specialty: Cardiothoracic Surgery Why: PA/LAT CXR to be taken (at [facility] which is in the same buidling as Dr. Vivi Martens office) one hour prior to office appointment. Office will call with appointment date and time Contact information: [address] [place] [address] [phone]        Nurse. Go on 08/13/2019.   Why: Appointment is with nurse only to have chest tube sutures removed. Office will call with appointment  time. Contact information: [address] [place] [address]          Signed: Elgie Collard 08/02/2019, 8:27 AM

## 2019-08-03 MED FILL — Mannitol IV Soln 20%: INTRAVENOUS | Qty: 500 | Status: AC

## 2019-08-03 MED FILL — Sodium Bicarbonate IV Soln 8.4%: INTRAVENOUS | Qty: 50 | Status: AC

## 2019-08-03 MED FILL — Sodium Chloride IV Soln 0.9%: INTRAVENOUS | Qty: 3000 | Status: AC

## 2019-08-03 MED FILL — Heparin Sodium (Porcine) Inj 1000 Unit/ML: INTRAMUSCULAR | Qty: 30 | Status: AC

## 2019-08-03 MED FILL — Magnesium Sulfate Inj 50%: INTRAMUSCULAR | Qty: 10 | Status: AC

## 2019-08-03 MED FILL — Heparin Sodium (Porcine) Inj 1000 Unit/ML: INTRAMUSCULAR | Qty: 40 | Status: AC

## 2019-08-03 MED FILL — Potassium Chloride Inj 2 mEq/ML: INTRAVENOUS | Qty: 40 | Status: AC

## 2019-08-03 MED FILL — Electrolyte-R (PH 7.4) Solution: INTRAVENOUS | Qty: 4000 | Status: AC

## 2019-08-03 MED FILL — Lidocaine HCl Local Soln Prefilled Syringe 100 MG/5ML (2%): INTRAMUSCULAR | Qty: 10 | Status: AC

## 2019-08-11 ENCOUNTER — Telehealth: Payer: Self-pay

## 2019-08-11 NOTE — Telephone Encounter (Signed)
Patient contacted the office again to state that he had taken a fluid pill yesterday because he was worried about swelling in his legs which prompted him to contact our office.  He stated that the swelling in his legs has gone down.  He also weighed himself and has lost some weight as well.  Patient stated that he was feeling much better and would see [place] at the office this Friday, 25 th for a suture removal at that time we could assess his legs to see if any further treatment is neccessary.

## 2019-08-13 ENCOUNTER — Ambulatory Visit (INDEPENDENT_AMBULATORY_CARE_PROVIDER_SITE_OTHER): Payer: Self-pay

## 2019-08-13 ENCOUNTER — Telehealth: Payer: Self-pay

## 2019-08-13 ENCOUNTER — Other Ambulatory Visit: Payer: Self-pay

## 2019-08-13 DIAGNOSIS — Z4802 Encounter for removal of sutures: Secondary | ICD-10-CM | POA: Insufficient documentation

## 2019-08-13 NOTE — Progress Notes (Signed)
Patient arrived for nurse visit to remove 2 sutures mind-abdomen post- procedure AVR with Dr. Cyndia Bent.  Sutures removed with no signs/ symptoms of infection noted.  Patient tolerated procedure well.  Patient/ family instructed to keep the incision sites clean and dry.  Patient advised to contact the office with concerns if patient noticed any signs/ symptoms of infection.  Patient/ family acknowledged instructions given.   Patient also concerned about bilateral lower extremity dependant edema, 2+.  Stated that it is going down, but he has been taking Demadex that he had prescribed from his Cardiologist.  Patient stated that his weight he last 2 days have been today, 234 lbs., yesterday 241 lbs with the medication.  Will make Dr. Cyndia Bent aware.  Also, patient had questions about driving, he stated that he is no longer taking pain medications.

## 2019-08-13 NOTE — Telephone Encounter (Signed)
Patient advised that he could continue to take Demadex for bilateral lower extremity swelling per Dr. Cyndia Bent.  Patient told to take medication as long as he had swelling in his legs.  He does have a follow-up appointment with his Cardiologist this coming week for a follow-up.  Patient also inquired about driving.  He stated that "I need to get back to work".  Advised patient that at this point surgery being 07/29/19 that it is too soon to start back driving even though he is not taking any pain medications.  Wanted to reiterate the importance of his sternum healing before driving.  He acknowledged receipt but was hesitant to state he would not drive.

## 2019-08-20 ENCOUNTER — Encounter: Payer: Self-pay | Admitting: Cardiology

## 2019-08-20 ENCOUNTER — Ambulatory Visit (INDEPENDENT_AMBULATORY_CARE_PROVIDER_SITE_OTHER): Payer: Medicare Other | Admitting: Cardiology

## 2019-08-20 ENCOUNTER — Other Ambulatory Visit: Payer: Self-pay

## 2019-08-20 VITALS — BP 122/64 | HR 87 | Ht 72.0 in | Wt 237.0 lb

## 2019-08-20 DIAGNOSIS — I2510 Atherosclerotic heart disease of native coronary artery without angina pectoris: Secondary | ICD-10-CM | POA: Diagnosis not present

## 2019-08-20 DIAGNOSIS — R600 Localized edema: Secondary | ICD-10-CM | POA: Diagnosis not present

## 2019-08-20 DIAGNOSIS — Z952 Presence of prosthetic heart valve: Secondary | ICD-10-CM | POA: Diagnosis not present

## 2019-08-20 MED ORDER — TORSEMIDE 20 MG PO TABS: 40.0000 mg | ORAL_TABLET | Freq: Every day | ORAL | 3 refills | Status: DC

## 2019-08-20 NOTE — Progress Notes (Signed)
Patient is here for follow up visit.  Subjective:   Henry Sheppard, male    DOB: 02/06/1966, 53 y.o.   MRN: [id number]   Chief Complaint  Patient presents with  . Leg Swelling   HPI  53 year old Caucasian male status post bioproshtetic AVR (21 mm pericardial tissue valve), in 2011 in [place], nonobstructive coronary artery disease, h/o Hodgkin's lymphoma in remission, opioid dependence currently on Suboxone, prior orthoedic surgeries, now s/p redo sternotomy and Aortic root replacement using a 21 mm Medtronic freestyle aortic bioprosthesis (07/2018) due to severe patient prosthesis mismatch.  Since the surgery, he has noticed remarkable improvement in his dyspnea on exertion. He continues to have leg swelling, which improved with torsemide.     Past Medical History:  Diagnosis Date  . Anxiety   . Arthritis   . COPD (chronic obstructive pulmonary disease) ([place])   . Depression   . Dyspnea   . H/O colonoscopy 2018   NORMAL  . H/O echocardiogram 10/07/2018   MODERATE TO SEVERE  AORTIC VALVE STENOSIS..EF 40-50%  . H/O endoscopy 2018  . Heart murmur   . Hodgkins lymphoma ([place])    in remission  . Hyperlipidemia, group A   . Hypertension, benign   . Left bundle branch block   . Narcotic dependence, in remission ([place])   . Pneumonia     Past Surgical History:  Procedure Laterality Date  . AORTIC VALVE REPLACEMENT     21 mm pericardial tissue valve 2011 in [place]  . ASCENDING AORTIC ROOT REPLACEMENT N/A 07/29/2019   Procedure: ASCENDING AORTIC PORCINE ROOT REPLACEMENT USING MEDTRONIC FREESTYLE AORTIC ROOT SIZE 38mm and HEMOSHIELD STRAIGHT PLATINUM GRAFT SIZE 24;  Surgeon: [name], MD;  Location: [facility];  Service: Open Heart Surgery;  Laterality: N/A;  . CARDIAC CATHETERIZATION     12/22/18: 40% proximal RCA, normal LM, LAD, LCX  . CARDIAC VALVE REPLACEMENT     AVR pericardial tissue valve 2011 ([place])  . collarbone surgery Right   . CORONARY ARTERY BYPASS GRAFT     . lymph angiogram     when pt.  was 17 yrs. old  . RIGHT/LEFT HEART CATH AND CORONARY ANGIOGRAPHY N/A 12/22/2018   Procedure: RIGHT/LEFT HEART CATH AND CORONARY ANGIOGRAPHY;  Surgeon: Nigel Mormon, MD;  Location: [facility];  Service: Cardiovascular;  Laterality: N/A;  . SPLENECTOMY    . TEE WITHOUT CARDIOVERSION N/A 12/22/2018   Procedure: TRANSESOPHAGEAL ECHOCARDIOGRAM (TEE);  Surgeon: Nigel Mormon, MD;  Location: [facility];  Service: Cardiovascular;  Laterality: N/A;  . TEE WITHOUT CARDIOVERSION N/A 07/29/2019   Procedure: TRANSESOPHAGEAL ECHOCARDIOGRAM (TEE);  Surgeon: [name], MD;  Location: [facility];  Service: Open Heart Surgery;  Laterality: N/A;  . TOTAL THYROIDECTOMY       Social History   Socioeconomic History  . Marital status: Married    Spouse name: Not on file  . Number of children: Not on file  . Years of education: Not on file  . Highest education level: Not on file  Occupational History  . Not on file  Social Needs  . Financial resource strain: Not on file  . Food insecurity    Worry: Not on file    Inability: Not on file  . Transportation needs    Medical: Not on file    Non-medical: Not on file  Tobacco Use  . Smoking status: Former Smoker    Packs/day: 2.00    Years: 34.00  Pack years: 68.00    Types: Cigarettes    Quit date: 03/16/2019    Years since quitting: 0.4  . Smokeless tobacco: Never Used  . Tobacco comment: 2 ppd  Substance and Sexual Activity  . Alcohol use: Not Currently  . Drug use: Not on file    Comment: smokes 1 joint daily  . Sexual activity: Yes  Lifestyle  . Physical activity    Days per week: Not on file    Minutes per session: Not on file  . Stress: Not on file  Relationships  . Social Herbalist on phone: Not on file    Gets together: Not on file    Attends religious service: Not on file    Active member of club or organization: Not on file    Attends meetings of clubs or  organizations: Not on file    Relationship status: Not on file  . Intimate partner violence    Fear of current or ex partner: Not on file    Emotionally abused: Not on file    Physically abused: Not on file    Forced sexual activity: Not on file  Other Topics Concern  . Not on file  Social History Narrative  . Not on file     Current Outpatient Medications on File Prior to Visit  Medication Sig Dispense Refill  . albuterol (PROVENTIL HFA;VENTOLIN HFA) 108 (90 Base) MCG/ACT inhaler Inhale 2 puffs into the lungs every 6 (six) hours as needed (wheezing/shortness of breath).     Marland Kitchen aspirin EC 325 MG EC tablet Take 1 tablet (325 mg total) by mouth daily. 30 tablet 0  . atorvastatin (LIPITOR) 40 MG tablet Take 40 mg by mouth at bedtime.     . carvedilol (COREG) 6.25 MG tablet Take 1 tablet (6.25 mg total) by mouth 2 (two) times daily with a meal. 60 tablet 1  . clonazePAM (KLONOPIN) 1 MG tablet Take 1 mg by mouth at bedtime.     . Fluticasone-Umeclidin-Vilant (TRELEGY ELLIPTA) 100-62.5-25 MCG/INH AEPB Inhale 1 puff into the lungs daily. (Patient taking differently: Inhale 2 puffs into the lungs daily. ) 60 each 3  . levothyroxine (SYNTHROID, LEVOTHROID) 200 MCG tablet Take 200 mcg by mouth daily.     Marland Kitchen losartan (COZAAR) 25 MG tablet Take 25 mg by mouth at bedtime.     . sertraline (ZOLOFT) 100 MG tablet Take 100 mg by mouth at bedtime.     . SUBOXONE 8-2 MG FILM Place 1 Film under the tongue 2 (two) times daily.   0  . Tiotropium Bromide Monohydrate (SPIRIVA RESPIMAT) 2.5 MCG/ACT AERS Inhale 1 puff into the lungs daily.     No current facility-administered medications on file prior to visit.     Cardiovascular studies:  EKG 08/20/2019: Sinus rhythm 73 bpm. Left bundle branch block.  Unchanged from previous EKG.  Perioperative TEE 08/07/2018: - Left Ventricle: The left ventricle is unchanged from pre-bypass. - Aorta: A graft was placed in the ascending aorta for repair. - Left Atrial  Appendage: The left atrial appendage appears unchanged from pre-bypass. - Aortic Valve: A bioprosthetic valve was placed, leaflets are thin and freely mobile. No regurgitation post repair. The gradient recorded across the prosthetic valve is 15 mmHg. No perivalvular leak noted. - Mitral Valve: The mitral valve appears unchanged from pre-bypass. - Tricuspid Valve: The tricuspid valve appears unchanged from pre-bypass. - Interatrial Septum: The interatrial septum appears unchanged from pre-bypass. - Pericardium: The pericardium  appears unchanged from pre-bypass.  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  TEE 12/22/2018: - Left ventricle: There was moderate concentric hypertrophy.   Systolic function was normal. The estimated ejection fraction was   in the range of 55% to 60%. Wall motion was normal; there were no   regional wall motion abnormalities. - Aortic valve: Well seated 21 mm Carpentier-Edwards bioprosthetic   aortic valve. Mild pannus formation. Normal excursion of aortic   valve leaflets.   Severe prosthetic valve stenosis. Mean PG 56 mmHg. Vmax 4.5   m/sec. Acceleration time 148 msec.   Higher DVI likely suggests subvalvular narrowing/small annulus. - Mitral valve: Mildly calcified annulus. There was mild   regurgitation. - Tricuspid valve: There was mild regurgitation. Peak RV-RA   gradient (S): 28 mm Hg.  Labs: Results for VOLVI, KAPSNER (MRN [id number]) as of 08/20/2019 14:02  Ref. Range 07/31/2019 05:03  Sodium Latest Ref Range: 135 - 145 mmol/L  137  Potassium Latest Ref Range: 3.5 - 5.1 mmol/L 3.9  Chloride Latest Ref Range: 98 - 111 mmol/L 101  CO2 Latest Ref Range: 22 - 32 mmol/L 29  Glucose Latest Ref Range: 70 - 99 mg/dL 118 (H)  BUN Latest Ref Range: 6 - 20 mg/dL 6  Creatinine Latest Ref Range: 0.61 - 1.24 mg/dL 0.58 (L)  Calcium Latest Ref Range: 8.9 - 10.3 mg/dL 8.6 (L)  Anion gap Latest Ref Range: 5 - 15  7  GFR, Est Non African American Latest Ref Range: >60 mL/min >60  GFR, Est African American Latest Ref Range: >60 mL/min >60   Results for CARDAE, SAPP (MRN [id number]) as of 08/20/2019 14:02  Ref. Range 08/02/2019 06:04  WBC Latest Ref Range: 4.0 - 10.5 K/uL 14.5 (H)  RBC Latest Ref Range: 4.22 - 5.81 MIL/uL 3.61 (L)  Hemoglobin Latest Ref Range: 13.0 - 17.0 g/dL 11.2 (L)  HCT Latest Ref Range: 39.0 - 52.0 % 35.0 (L)  MCV Latest Ref Range: 80.0 - 100.0 fL 97.0  MCH Latest Ref Range: 26.0 - 34.0 pg 31.0  MCHC Latest Ref Range: 30.0 - 36.0 g/dL 32.0  RDW Latest Ref Range: 11.5 - 15.5 % 14.6  Platelets Latest Ref Range: 150 - 400 K/uL 227  nRBC Latest Ref Range: 0.0 - 0.2 % 0.0   Review of Systems  Constitution: Negative for decreased appetite, malaise/fatigue, weight gain and weight loss.  HENT: Negative for congestion.   Eyes: Negative for visual disturbance.  Cardiovascular: Positive for leg swelling. Negative for chest pain, dyspnea on exertion, palpitations and syncope.  Respiratory: Negative for shortness of breath.   Endocrine: Negative for cold intolerance.  Hematologic/Lymphatic: Does not bruise/bleed easily.  Skin: Negative for itching and rash.  Musculoskeletal: Negative for myalgias.  Gastrointestinal: Negative for abdominal pain, nausea and vomiting.  Genitourinary: Negative for dysuria.  Neurological: Negative for dizziness and weakness.  Psychiatric/Behavioral: The patient is not nervous/anxious.   All other systems reviewed and are negative.      Objective:    Vitals:   08/20/19 1349   BP: 122/64  Pulse: 87  SpO2: 96%   Filed Weights   08/20/19 1349  Weight:  237 lb (107.5 kg)      Physical Exam  Constitutional: He is oriented to person, place, and time. He appears well-developed and well-nourished. No distress.  HENT:  Head: Normocephalic and atraumatic.  Eyes: Pupils are equal, round, and reactive to light. Conjunctivae are normal.  Neck: No JVD present.  Cardiovascular: Normal rate and regular rhythm.  Murmur (II/VI crescendo decrescendo murmur RUSB. Improved since redo AVR) heard. Pulmonary/Chest: Effort normal and breath sounds normal. He has no wheezes. He has no rales.  Abdominal: Soft. Bowel sounds are normal. There is no rebound.  Musculoskeletal:        General: Edema (2+ b/l) present.  Lymphadenopathy:    He has no cervical adenopathy.  Neurological: He is alert and oriented to person, place, and time. No cranial nerve deficit.  Skin: Skin is warm and dry.  Psychiatric: He has a normal mood and affect.  Nursing note and vitals reviewed.       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  S/P redo bioprosthetic AVR 21 mm (07/2018): S/p redo Bioprosthetic aortic valve for severe patient prosthesis mismatch.   Will check post op echocardiogram.  Leg edema: Recommend torsemide 40 mg for now. Will check echcoardiogram.  Coronary artery disease involving native coronary artery of native heart without angina pectoris Nonobstructive CAD. Continue lipitor 40 mg, carvedilol 6.25 bid May reduce Aspirin to 81 mg from 325 mg, when okay by Dr. Cyndia Bent.    F/u w/me in 6 months.  Nigel Mormon, MD [facility] [place] Pager: [phone] Office: [phone] If no answer Cell [phone]

## 2019-08-24 ENCOUNTER — Other Ambulatory Visit: Payer: Self-pay | Admitting: Physician Assistant

## 2019-08-31 ENCOUNTER — Other Ambulatory Visit: Payer: Self-pay

## 2019-08-31 DIAGNOSIS — Z952 Presence of prosthetic heart valve: Secondary | ICD-10-CM

## 2019-09-01 ENCOUNTER — Ambulatory Visit
Admission: RE | Admit: 2019-09-01 | Discharge: 2019-09-01 | Disposition: A | Discharge status: Home / Self Care | Payer: Medicare Other | Source: Ambulatory Visit | Attending: Surgery | Admitting: Surgery

## 2019-09-01 ENCOUNTER — Other Ambulatory Visit: Payer: Self-pay

## 2019-09-01 ENCOUNTER — Encounter: Payer: Self-pay | Admitting: Surgery

## 2019-09-01 ENCOUNTER — Ambulatory Visit (INDEPENDENT_AMBULATORY_CARE_PROVIDER_SITE_OTHER): Payer: Self-pay | Admitting: Surgery

## 2019-09-01 VITALS — BP 97/57 | HR 80 | Temp 97.8°F | Resp 20 | Ht 72.0 in | Wt 235.0 lb

## 2019-09-01 DIAGNOSIS — Z952 Presence of prosthetic heart valve: Secondary | ICD-10-CM

## 2019-09-01 DIAGNOSIS — I350 Nonrheumatic aortic (valve) stenosis: Secondary | ICD-10-CM

## 2019-09-01 NOTE — Progress Notes (Signed)
HPI: Patient returns for routine postoperative follow-up having undergone redo median sternotomy and aortic root replacement using a 21 mm Medtronic Freestyle aortic bioprosthesis on 07/29/2019. The patient's early postoperative recovery while in the hospital was notable for an uncomplicated postoperative course.  He was discharged on postoperative day 4. Since hospital discharge the patient reports that he has been feeling well.  His stamina is markedly improved and he walked 5 miles in 1 day last week.  He has mild incisional soreness but has not been taking any pain medicine.  He has no shortness of breath.  He has continued to abstain from smoking.   Current Outpatient Medications  Medication Sig Dispense Refill  . albuterol (PROVENTIL HFA;VENTOLIN HFA) 108 (90 Base) MCG/ACT inhaler Inhale 2 puffs into the lungs every 6 (six) hours as needed (wheezing/shortness of breath).     Marland Kitchen aspirin EC 325 MG EC tablet Take 1 tablet (325 mg total) by mouth daily. 30 tablet 0  . atorvastatin (LIPITOR) 40 MG tablet Take 40 mg by mouth at bedtime.     . carvedilol (COREG) 6.25 MG tablet Take 1 tablet (6.25 mg total) by mouth 2 (two) times daily with a meal. 60 tablet 1  . clonazePAM (KLONOPIN) 1 MG tablet Take 1 mg by mouth at bedtime.     . Fluticasone-Umeclidin-Vilant (TRELEGY ELLIPTA) 100-62.5-25 MCG/INH AEPB Inhale 1 puff into the lungs daily. (Patient taking differently: Inhale 2 puffs into the lungs daily. ) 60 each 3  . losartan (COZAAR) 25 MG tablet Take 25 mg by mouth at bedtime.     . sertraline (ZOLOFT) 100 MG tablet Take 100 mg by mouth at bedtime.     . SUBOXONE 8-2 MG FILM Place 1 Film under the tongue 2 (two) times daily.   0  . Tiotropium Bromide Monohydrate (SPIRIVA RESPIMAT) 2.5 MCG/ACT AERS Inhale 1 puff into the lungs daily.    Marland Kitchen torsemide (DEMADEX) 20 MG tablet Take 2 tablets (40 mg total) by mouth daily. 60 tablet 3  . levothyroxine (SYNTHROID, LEVOTHROID) 200 MCG tablet Take 200  mcg by mouth daily.      No current facility-administered medications for this visit.     Physical Exam: BP (!) 97/57   Pulse 80   Temp 97.8 F (36.6 C) (Skin)   Resp 20   Ht 6' (1.829 m)   Wt 235 lb (106.6 kg)   SpO2 90%   BMI 31.87 kg/m  He looks well. Cardiac exam shows a regular rate and rhythm with no murmur. Lungs are clear. Chest incision is healing well and the sternum is stable. There is no peripheral edema.  Diagnostic Tests:  CLINICAL DATA:  Status post aortic valve and ascending aortic root replacement. Hypertension.  EXAM: CHEST - 2 VIEW  COMPARISON:  August 02, 2019  FINDINGS: There is no appreciable edema or consolidation. No appreciable pleural effusion. Heart is upper normal in size with pulmonary vascularity normal. Patient is status post median sternotomy. There are surgical clips in the anterior mediastinum. No evident adenopathy. There is postoperative change involving the right clavicle. There also surgical clips in the upper abdomen. No pneumothorax.  IMPRESSION: Areas of postoperative change. Heart is upper normal in size with pulmonary vascularity normal. No edema or consolidation. No pleural effusions. No pneumothorax.   Electronically Signed   By: Lowella Grip III M.D.   On: 09/01/2019 10:27   Impression:  Overall he is making an excellent recovery following his cardiac surgery.  I  encouraged him to continue to walk as much as possible.  I asked him not to lift anything heavier than 10 pounds for 3 months postoperatively.  I told him that he can return to driving a car.  He has a follow-up appointment in the near future with Dr. Virgina Jock and I am sure he will have a follow-up echocardiogram as a new baseline.  Plan:  He will continue to follow-up with Dr. Virgina Jock and I will be happy to see him back if the need arises.  He will contact me if he develops any problems with his incision.   [name], MD  [facility] [phone]

## 2019-09-03 ENCOUNTER — Other Ambulatory Visit: Payer: Self-pay

## 2019-09-03 ENCOUNTER — Ambulatory Visit (INDEPENDENT_AMBULATORY_CARE_PROVIDER_SITE_OTHER): Payer: Medicare Other

## 2019-09-03 DIAGNOSIS — Z952 Presence of prosthetic heart valve: Secondary | ICD-10-CM

## 2019-09-03 DIAGNOSIS — R600 Localized edema: Secondary | ICD-10-CM | POA: Diagnosis not present

## 2019-09-06 NOTE — Progress Notes (Signed)
Pt aware.

## 2019-09-20 ENCOUNTER — Encounter: Payer: Self-pay | Admitting: Cardiology

## 2019-09-20 ENCOUNTER — Ambulatory Visit (INDEPENDENT_AMBULATORY_CARE_PROVIDER_SITE_OTHER): Payer: Medicare Other | Admitting: Cardiology

## 2019-09-20 ENCOUNTER — Other Ambulatory Visit: Payer: Self-pay

## 2019-09-20 VITALS — BP 125/65 | HR 74 | Temp 98.7°F | Ht 71.0 in | Wt 238.0 lb

## 2019-09-20 DIAGNOSIS — R600 Localized edema: Secondary | ICD-10-CM

## 2019-09-20 DIAGNOSIS — R0600 Dyspnea, unspecified: Secondary | ICD-10-CM

## 2019-09-20 DIAGNOSIS — I2510 Atherosclerotic heart disease of native coronary artery without angina pectoris: Secondary | ICD-10-CM

## 2019-09-20 DIAGNOSIS — Z952 Presence of prosthetic heart valve: Secondary | ICD-10-CM

## 2019-09-20 DIAGNOSIS — I5020 Unspecified systolic (congestive) heart failure: Secondary | ICD-10-CM

## 2019-09-20 DIAGNOSIS — R0609 Other forms of dyspnea: Secondary | ICD-10-CM

## 2019-09-20 MED ORDER — SPIRONOLACTONE 25 MG PO TABS: 25.0000 mg | ORAL_TABLET | Freq: Every day | ORAL | 3 refills | Status: DC

## 2019-09-20 NOTE — Progress Notes (Signed)
Patient is here for follow up visit.  Subjective:   Henry Sheppard, male    DOB: [birth year], 53 y.o.   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  Echocardiogram on 09/03/2019 showed EF 40-45%, normal functioning bioprosthetic aortic valve.  While he has had improvement in his weight and shortness of breath since surgery, he continues to have bilateral leg swelling.  For the last 2 weeks, he is having acute back pain, worse with deep breathing.  This is also affecting his dyspnea.   Past Medical History:  Diagnosis Date   Anxiety    Arthritis    COPD (chronic obstructive pulmonary disease) ([place])    Depression    Dyspnea    H/O colonoscopy 2018   NORMAL   H/O echocardiogram 10/07/2018   MODERATE TO SEVERE  AORTIC VALVE STENOSIS..EF 40-50%   H/O endoscopy 2018   Heart murmur    Hodgkins lymphoma ([place])    in remission   Hyperlipidemia, group A    Hypertension, benign    Left bundle branch block    Narcotic dependence, in remission ([place])    Pneumonia     Past Surgical History:  Procedure Laterality Date   AORTIC VALVE REPLACEMENT     21 mm pericardial tissue valve 2011 in [place]   ASCENDING AORTIC ROOT REPLACEMENT N/A 07/29/2019   Procedure: ASCENDING AORTIC PORCINE ROOT REPLACEMENT USING MEDTRONIC FREESTYLE AORTIC ROOT SIZE 17mm and HEMOSHIELD STRAIGHT PLATINUM GRAFT SIZE 24;  Surgeon: [name], MD;  Location: [place];  Service: Open Heart Surgery;  Laterality: N/A;   CARDIAC  CATHETERIZATION     12/22/18: 40% proximal RCA, normal LM, LAD, LCX   CARDIAC VALVE REPLACEMENT     AVR pericardial tissue valve 2011 ([place])   collarbone surgery Right    CORONARY ARTERY BYPASS GRAFT     lymph angiogram     when pt.  was 17 yrs. old   RIGHT/LEFT HEART CATH AND CORONARY ANGIOGRAPHY N/A 12/22/2018   Procedure: RIGHT/LEFT HEART CATH AND CORONARY ANGIOGRAPHY;  Surgeon: Nigel Mormon, MD;  Location: [place] [facility];  Service: Cardiovascular;  Laterality: N/A;   SPLENECTOMY     TEE WITHOUT CARDIOVERSION N/A 12/22/2018   Procedure: TRANSESOPHAGEAL ECHOCARDIOGRAM (TEE);  Surgeon: Nigel Mormon, MD;  Location: [facility];  Service: Cardiovascular;  Laterality: N/A;   TEE WITHOUT CARDIOVERSION N/A 07/29/2019   Procedure: TRANSESOPHAGEAL ECHOCARDIOGRAM (TEE);  Surgeon: [name], MD;  Location: [place];  Service: Open Heart Surgery;  Laterality: N/A;   TOTAL THYROIDECTOMY       Social History   Socioeconomic History   Marital status: Married    Spouse name: Not on file   Number of children: Not on file   Years of education: Not on file   Highest education level: Not on file  Occupational History   Not on file  Social Needs   Financial resource strain: Not on file   Food insecurity    Worry: Not on file    Inability: Not on file   Transportation needs  Medical: Not on file    Non-medical: Not on file  Tobacco Use   Smoking status: Former Smoker    Packs/day: 2.00    Years: 34.00    Pack years: 68.00    Types: Cigarettes    Quit date: 03/16/2019    Years since quitting: 0.5   Smokeless tobacco: Never Used   Tobacco comment: 2 ppd  Substance and Sexual Activity   Alcohol use: Not Currently   Drug use: Not on file    Comment: smokes 1 joint daily   Sexual activity: Yes  Lifestyle   Physical activity    Days per week: Not on file    Minutes per session: Not on file   Stress: Not on file  Relationships   Social  connections    Talks on phone: Not on file    Gets together: Not on file    Attends religious service: Not on file    Active member of club or organization: Not on file    Attends meetings of clubs or organizations: Not on file    Relationship status: Not on file   Intimate partner violence    Fear of current or ex partner: Not on file    Emotionally abused: Not on file    Physically abused: Not on file    Forced sexual activity: Not on file  Other Topics Concern   Not on file  Social History Narrative   Not on file     Current Outpatient Medications on File Prior to Visit  Medication Sig Dispense Refill   albuterol (PROVENTIL HFA;VENTOLIN HFA) 108 (90 Base) MCG/ACT inhaler Inhale 2 puffs into the lungs every 6 (six) hours as needed (wheezing/shortness of breath).      aspirin EC 325 MG EC tablet Take 1 tablet (325 mg total) by mouth daily. (Patient taking differently: Take 81 mg by mouth daily. ) 30 tablet 0   atorvastatin (LIPITOR) 40 MG tablet Take 40 mg by mouth at bedtime.      carvedilol (COREG) 6.25 MG tablet Take 1 tablet (6.25 mg total) by mouth 2 (two) times daily with a meal. 60 tablet 1   clonazePAM (KLONOPIN) 1 MG tablet Take 1 mg by mouth at bedtime.      cyclobenzaprine (FLEXERIL) 10 MG tablet Take 1 tablet by mouth daily.     Fluticasone-Umeclidin-Vilant (TRELEGY ELLIPTA) 100-62.5-25 MCG/INH AEPB Inhale 1 puff into the lungs daily. (Patient taking differently: Inhale 2 puffs into the lungs daily. ) 60 each 3   levothyroxine (SYNTHROID, LEVOTHROID) 200 MCG tablet Take 200 mcg by mouth daily.      losartan (COZAAR) 25 MG tablet Take 25 mg by mouth at bedtime.      OXYGEN Inhale into the lungs as needed.     sertraline (ZOLOFT) 100 MG tablet Take 100 mg by mouth at bedtime.      SUBOXONE 8-2 MG FILM Place 1 Film under the tongue 2 (two) times daily.   0   Tiotropium Bromide Monohydrate (SPIRIVA RESPIMAT) 2.5 MCG/ACT AERS Inhale 1 puff into the lungs  daily.     torsemide (DEMADEX) 20 MG tablet Take 2 tablets (40 mg total) by mouth daily. 60 tablet 3   No current facility-administered medications on file prior to visit.     Cardiovascular studies:  Echocardiogram 09/03/2019: Left ventricle cavity is normal in size. Mild concentric hypertrophy of the left ventricle. Abnormal septal wall motion due to post-operative valve. Mildly depressed LV systolic function with visual  EF 40-45%. Doppler evidence of grade II (pseudonormal) diastolic dysfunction, elevated LAP. Calculated EF 41%. Bioprosthetic aortic valve with redo aortic valve replacement. Well seated valve with no valvular or paravalvular leak. Mean PG of 10 mmHg likely baseline normal for this valve. AVA 1.7 cm2, VmAx 2.1 m/sec. Mild calcification of the mitral valve annulus and leaflet. Mildly restricted mitral valve leaflets with no significant stenosis.  Trace mitral regurgitation. Mild to moderate tricuspid regurgitation. Estimated pulmonary artery systolic pressure is 37 mmHg.  Compared to previous study on 10/07/2018, severe prosthetic valve stenosis is now resolved after redo bioprosthetic aortic valve replacement.   EKG 08/20/2019: Sinus rhythm 73 bpm. Left bundle branch block.  Unchanged from previous EKG.  Perioperative TEE 08/07/2018: - Left Ventricle: The left ventricle is unchanged from pre-bypass. - Aorta: A graft was placed in the ascending aorta for repair. - Left Atrial Appendage: The left atrial appendage appears unchanged from pre-bypass. - Aortic Valve: A bioprosthetic valve was placed, leaflets are thin and freely mobile. No regurgitation post repair. The gradient recorded across the prosthetic valve is 15 mmHg. No perivalvular leak noted. - Mitral Valve: The mitral valve appears unchanged from pre-bypass. - Tricuspid Valve: The tricuspid valve appears unchanged from pre-bypass. - Interatrial Septum: The interatrial septum appears unchanged from pre-bypass. -  Pericardium: The pericardium appears unchanged from pre-bypass.  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  TEE 12/22/2018: - Left ventricle: There was moderate concentric hypertrophy.   Systolic function was normal. The estimated ejection fraction was   in the range of 55% to 60%. Wall motion was normal; there were no   regional wall motion abnormalities. - Aortic valve: Well seated 21 mm Carpentier-Edwards bioprosthetic   aortic valve. Mild pannus formation. Normal excursion of aortic   valve leaflets.   Severe prosthetic valve stenosis. Mean PG 56 mmHg. Vmax 4.5   m/sec. Acceleration time 148 msec.   Higher DVI likely suggests subvalvular narrowing/small annulus. - Mitral valve: Mildly calcified annulus. There was mild   regurgitation. - Tricuspid valve: There was mild regurgitation. Peak RV-RA   gradient (S): 28 mm Hg.  Labs: Results for SENACA, BERARDO (MRN [id number]) as of 08/20/2019 14:02  Ref. Range 07/31/2019 05:03  Sodium Latest Ref Range: 135 - 145 mmol/L 137  Potassium Latest Ref Range: 3.5 - 5.1 mmol/L 3.9  Chloride Latest Ref Range: 98 - 111 mmol/L 101  CO2 Latest Ref Range: 22 - 32 mmol/L 29  Glucose Latest Ref Range: 70 - 99 mg/dL 118 (H)  BUN Latest Ref Range: 6 - 20 mg/dL 6  Creatinine Latest Ref Range: 0.61 - 1.24 mg/dL 0.58 (L)  Calcium Latest Ref Range: 8.9 - 10.3 mg/dL 8.6 (L)  Anion gap Latest Ref Range: 5 - 15  7  GFR, Est Non African American Latest Ref Range: >60 mL/min >60  GFR, Est African American Latest Ref Range: >60 mL/min >60     Results for IGOR, OQUIN (MRN [id number]) as of 08/20/2019 14:02  Ref. Range 08/02/2019 06:04  WBC Latest Ref Range: 4.0 - 10.5 K/uL 14.5 (H)  RBC Latest Ref Range: 4.22 - 5.81 MIL/uL 3.61 (L)  Hemoglobin Latest  Ref Range: 13.0 - 17.0 g/dL 11.2 (L)  HCT Latest Ref Range: 39.0 - 52.0 % 35.0 (L)  MCV Latest Ref Range: 80.0 - 100.0 fL 97.0  MCH Latest Ref Range: 26.0 - 34.0 pg 31.0  MCHC Latest Ref Range: 30.0 - 36.0 g/dL 32.0  RDW Latest Ref Range: 11.5 - 15.5 % 14.6  Platelets Latest Ref Range: 150 - 400 K/uL 227  nRBC Latest Ref Range: 0.0 - 0.2 % 0.0   Review of Systems  Constitution: Negative for decreased appetite, malaise/fatigue, weight gain and weight loss.  HENT: Negative for congestion.   Eyes: Negative for visual disturbance.  Cardiovascular: Positive for leg swelling. Negative for chest pain, dyspnea on exertion, palpitations and syncope.  Respiratory: Negative for shortness of breath.   Endocrine: Negative for cold intolerance.  Hematologic/Lymphatic: Does not bruise/bleed easily.  Skin: Negative for itching and rash.  Musculoskeletal: Negative for myalgias.  Gastrointestinal: Negative for abdominal pain, nausea and vomiting.  Genitourinary: Negative for dysuria.  Neurological: Negative for dizziness and weakness.  Psychiatric/Behavioral: The patient is not nervous/anxious.   All other systems reviewed and are negative.      Objective:    Vitals:   09/20/19 1140  BP: 125/65  Pulse: 74  Temp: 98.7 F (37.1 C)  SpO2: 95%   Filed Weights   09/20/19 1140  Weight: 238 lb (108 kg)      Physical Exam  Constitutional: He is oriented to person, place, and time. He appears well-developed and well-nourished. No distress.  HENT:  Head: Normocephalic and atraumatic.  Eyes: Pupils are equal, round, and reactive to light. Conjunctivae are normal.  Neck: No JVD present.  Cardiovascular: Normal rate and regular rhythm.  Murmur (II/VI crescendo decrescendo  murmur RUSB. Improved since redo AVR) heard. Pulmonary/Chest: Effort normal and breath sounds normal. He has no wheezes. He has no rales.  Abdominal: Soft. Bowel sounds are normal. There is no rebound.  Musculoskeletal:        General: Edema (2+ b/l) present.  Lymphadenopathy:    He has no cervical adenopathy.  Neurological: He is alert and oriented to person, place, and time. No cranial nerve deficit.  Skin: Skin is warm and dry.  Psychiatric: He has a normal mood and affect.  Nursing note and vitals reviewed.       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  S/P redo bioprosthetic AVR 21 mm (07/2018): S/p redo Bioprosthetic aortic valve for severe patient prosthesis mismatch.    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  Although he now is status post redo AVR, he continues to have NYHA class II-3 heart failure symptoms. Started spironolactone 25 mg once daily.  Torsemide can be reduced to 20 mg daily in future, depending on leg swelling.  Coronary artery disease involving native coronary artery of native heart without angina pectoris Nonobstructive CAD. Continue lipitor 40 mg, carvedilol 6.25 bid Will reduce Aspirin to 81 mg at next visit.  I encouraged him to speak with his PCP regarding his back pain.  He may need medications/physical therapy/imaging.  F/u w/me in 3-4 weeks.  Nigel Mormon, MD [facility] Pager: [phone] Office: [phone]  If no answer Cell [phone]

## 2019-10-22 ENCOUNTER — Telehealth (INDEPENDENT_AMBULATORY_CARE_PROVIDER_SITE_OTHER): Payer: Medicare Other | Admitting: Cardiology

## 2019-10-22 ENCOUNTER — Other Ambulatory Visit: Payer: Self-pay

## 2019-10-22 ENCOUNTER — Encounter: Payer: Self-pay | Admitting: Cardiology

## 2019-10-22 VITALS — Ht 71.0 in | Wt 220.0 lb

## 2019-10-22 DIAGNOSIS — Z952 Presence of prosthetic heart valve: Secondary | ICD-10-CM | POA: Diagnosis not present

## 2019-10-22 DIAGNOSIS — R600 Localized edema: Secondary | ICD-10-CM | POA: Diagnosis not present

## 2019-10-22 DIAGNOSIS — I2510 Atherosclerotic heart disease of native coronary artery without angina pectoris: Secondary | ICD-10-CM | POA: Diagnosis not present

## 2019-10-22 MED ORDER — ASPIRIN EC 81 MG PO TBEC: 81.0000 mg | DELAYED_RELEASE_TABLET | Freq: Every day | ORAL | 3 refills | Status: AC

## 2019-10-22 NOTE — Progress Notes (Signed)
Patient is here for follow up visit.  Subjective:   Henry Sheppard, male    DOB: May 05, 1966, 53 y.o.   MRN: [id number]  I connected with the patient on 10/22/2019 by a telephone call and verified that I am speaking with the correct person using two identifiers.     I offered the patient a video enabled application for a virtual visit. Unfortunately, this could not be accomplished due to technical difficulties/lack of video enabled phone/computer. I discussed the limitations of evaluation and management by telemedicine and the availability of in person appointments. The patient expressed understanding and agreed to proceed.   This visit type was conducted due to national recommendations for restrictions regarding the COVID-19 Pandemic (e.g. social distancing).  This format is felt to be most appropriate for this patient at this time.  All issues noted in this document were discussed and addressed.  No physical exam was performed (except for noted visual exam findings with Tele health visits).  The patient has consented to conduct a Tele health visit and understands insurance will be billed.   Chief Complaint  Patient presents with  . Leg Swelling  . 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  Echocardiogram on 09/03/2019 showed EF 40-45%, normal functioning bioprosthetic aortic valve. He has had improvement in his leg swelling. Exertional dyspnea has improved. He underwent epidural injection for his lower back pain, with significant improvement.   Past Medical History:  Diagnosis Date  .  Anxiety   . Arthritis   . COPD (chronic obstructive pulmonary disease) ([place])   . Depression   . Dyspnea   . H/O colonoscopy 2018   NORMAL  . H/O echocardiogram 10/07/2018   MODERATE TO SEVERE  AORTIC VALVE STENOSIS..EF 40-50%  . H/O endoscopy 2018  . Heart murmur   . Hodgkins lymphoma ([place])    in remission  . Hyperlipidemia, group A   . Hypertension, benign   . Left bundle branch block   . Narcotic dependence, in remission ([place])   . Pneumonia     Past Surgical History:  Procedure Laterality Date  . AORTIC VALVE REPLACEMENT     21 mm pericardial tissue valve 2011 in [place]  . ASCENDING AORTIC ROOT REPLACEMENT N/A 07/29/2019   Procedure: ASCENDING AORTIC PORCINE ROOT REPLACEMENT USING MEDTRONIC FREESTYLE AORTIC ROOT SIZE 21mm and HEMOSHIELD STRAIGHT PLATINUM GRAFT SIZE 24;  Surgeon: [name], MD;  Location: [facility];  Service: Open Heart Surgery;  Laterality: N/A;  . CARDIAC CATHETERIZATION     12/22/18: 40% proximal RCA, normal LM, LAD, LCX  . CARDIAC VALVE REPLACEMENT     AVR pericardial tissue valve 2011 ([place])  . collarbone surgery Right   . CORONARY ARTERY BYPASS GRAFT    . lymph angiogram     when pt.  was 17 yrs. old  . RIGHT/LEFT HEART CATH AND CORONARY ANGIOGRAPHY N/A 12/22/2018   Procedure: RIGHT/LEFT HEART CATH AND CORONARY ANGIOGRAPHY;  Surgeon: Nigel Mormon, MD;  Location: [facility];  Service: Cardiovascular;  Laterality: N/A;  . SPLENECTOMY    . TEE WITHOUT CARDIOVERSION N/A 12/22/2018   Procedure: TRANSESOPHAGEAL ECHOCARDIOGRAM (TEE);  Surgeon: Nigel Mormon, MD;  Location: [facility] ENDOSCOPY;  Service: Cardiovascular;  Laterality: N/A;  . TEE WITHOUT CARDIOVERSION N/A 07/29/2019   Procedure: TRANSESOPHAGEAL ECHOCARDIOGRAM (TEE);  Surgeon: [name], MD;  Location: [facility];  Service: Open Heart Surgery;  Laterality: N/A;  . TOTAL THYROIDECTOMY       Social History   Socioeconomic History  . Marital status: Married    Spouse name: Not  on file  . Number of children: 3  . Years of education: Not on file  . Highest education level: Not on file  Occupational History  . Not on file  Social Needs  . Financial resource strain: Not on file  . Food insecurity    Worry: Not on file    Inability: Not on file  . Transportation needs    Medical: Not on file    Non-medical: Not on file  Tobacco Use  . Smoking status: Former Smoker    Packs/day: 2.00    Years: 34.00    Pack years: 68.00    Types: Cigarettes    Quit date: 03/16/2019    Years since quitting: 0.6  . Smokeless tobacco: Never Used  . Tobacco comment: 2 ppd  Substance and Sexual Activity  . Alcohol use: Not Currently  . Drug use: Not on file    Comment: smokes 1 joint daily  . Sexual activity: Yes  Lifestyle  . Physical activity    Days per week: Not on file    Minutes per session: Not on file  . Stress: Not on file  Relationships  . Social Herbalist on phone: Not on file    Gets together: Not on file    Attends religious service: Not on file    Active member of club or organization: Not on file    Attends meetings of clubs or organizations: Not on file    Relationship status: Not on file  . Intimate partner violence    Fear of current or ex partner: Not on file    Emotionally abused: Not on file    Physically abused: Not on file    Forced sexual activity: Not on file  Other Topics Concern  . Not on file  Social History Narrative  . Not on file     Current Outpatient Medications on File Prior to Visit  Medication Sig Dispense Refill  . albuterol (PROVENTIL HFA;VENTOLIN HFA) 108 (90 Base) MCG/ACT inhaler Inhale 2 puffs into the lungs every 6 (six) hours as needed (wheezing/shortness of breath).     Marland Kitchen aspirin EC 325 MG EC tablet Take 1 tablet (325 mg total) by mouth daily. (Patient taking differently: Take 81 mg by mouth daily. ) 30 tablet 0  . atorvastatin (LIPITOR) 40 MG tablet Take 40 mg by mouth at bedtime.     . carvedilol  (COREG) 6.25 MG tablet Take 1 tablet (6.25 mg total) by mouth 2 (two) times daily with a meal. 60 tablet 1  . clonazePAM (KLONOPIN) 1 MG tablet Take 1 mg by mouth at bedtime.     . cyclobenzaprine (FLEXERIL) 10 MG tablet Take 1 tablet by mouth daily.    . Fluticasone-Umeclidin-Vilant (TRELEGY ELLIPTA) 100-62.5-25 MCG/INH AEPB Inhale 1 puff into the lungs daily. (Patient taking differently: Inhale 2 puffs into the lungs daily. ) 60 each 3  . levothyroxine (SYNTHROID, LEVOTHROID) 200 MCG tablet Take 200 mcg by mouth daily.     Marland Kitchen losartan (COZAAR) 25 MG tablet Take 25 mg  by mouth at bedtime.     . OXYGEN Inhale into the lungs as needed.    . sertraline (ZOLOFT) 100 MG tablet Take 100 mg by mouth at bedtime.     Marland Kitchen spironolactone (ALDACTONE) 25 MG tablet Take 1 tablet (25 mg total) by mouth daily. 30 tablet 3  . SUBOXONE 8-2 MG FILM Place 1 Film under the tongue 2 (two) times daily.   0  . Tiotropium Bromide Monohydrate (SPIRIVA RESPIMAT) 2.5 MCG/ACT AERS Inhale 1 puff into the lungs daily.    Marland Kitchen torsemide (DEMADEX) 20 MG tablet Take 2 tablets (40 mg total) by mouth daily. 60 tablet 3   No current facility-administered medications on file prior to visit.     Cardiovascular studies:  Echocardiogram 09/03/2019: Left ventricle cavity is normal in size. Mild concentric hypertrophy of the left ventricle. Abnormal septal wall motion due to post-operative valve. Mildly depressed LV systolic function with visual EF 40-45%. Doppler evidence of grade II (pseudonormal) diastolic dysfunction, elevated LAP. Calculated EF 41%. Bioprosthetic aortic valve with redo aortic valve replacement. Well seated valve with no valvular or paravalvular leak. Mean PG of 10 mmHg likely baseline normal for this valve. AVA 1.7 cm2, VmAx 2.1 m/sec. Mild calcification of the mitral valve annulus and leaflet. Mildly restricted mitral valve leaflets with no significant stenosis.  Trace mitral regurgitation. Mild to moderate tricuspid  regurgitation. Estimated pulmonary artery systolic pressure is 37 mmHg.  Compared to previous study on 10/07/2018, severe prosthetic valve stenosis is now resolved after redo bioprosthetic aortic valve replacement.   EKG 08/20/2019: Sinus rhythm 73 bpm. Left bundle branch block.  Unchanged from previous EKG.  Perioperative TEE 08/07/2018: - Left Ventricle: The left ventricle is unchanged from pre-bypass. - Aorta: A graft was placed in the ascending aorta for repair. - Left Atrial Appendage: The left atrial appendage appears unchanged from pre-bypass. - Aortic Valve: A bioprosthetic valve was placed, leaflets are thin and freely mobile. No regurgitation post repair. The gradient recorded across the prosthetic valve is 15 mmHg. No perivalvular leak noted. - Mitral Valve: The mitral valve appears unchanged from pre-bypass. - Tricuspid Valve: The tricuspid valve appears unchanged from pre-bypass. - Interatrial Septum: The interatrial septum appears unchanged from pre-bypass. - Pericardium: The pericardium appears unchanged from pre-bypass.  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  TEE 12/22/2018: - Left ventricle: There was moderate concentric hypertrophy.   Systolic function was normal. The estimated ejection fraction was   in the range of 55% to 60%. Wall motion was normal; there were no   regional wall motion abnormalities. - Aortic valve: Well seated 21 mm Carpentier-Edwards bioprosthetic   aortic valve. Mild pannus formation. Normal excursion of aortic   valve  leaflets.   Severe prosthetic valve stenosis. Mean PG 56 mmHg. Vmax 4.5   m/sec. Acceleration time 148 msec.   Higher DVI likely suggests subvalvular narrowing/small annulus. - Mitral valve: Mildly calcified annulus. There was mild   regurgitation. - Tricuspid valve: There was mild regurgitation. Peak RV-RA   gradient (S): 28 mm Hg.  Labs: Results for ELVEN, GROOME (MRN [id number]) as of 08/20/2019 14:02  Ref. Range 07/31/2019 05:03  Sodium Latest Ref Range: 135 - 145 mmol/L 137  Potassium Latest Ref Range: 3.5 - 5.1 mmol/L 3.9  Chloride Latest Ref Range: 98 - 111 mmol/L 101  CO2 Latest Ref Range: 22 - 32 mmol/L 29  Glucose Latest Ref Range: 70 - 99 mg/dL 118 (H)  BUN Latest Ref Range: 6 - 20 mg/dL 6  Creatinine Latest Ref Range: 0.61 - 1.24 mg/dL 0.58 (L)  Calcium Latest Ref Range: 8.9 - 10.3 mg/dL 8.6 (L)  Anion gap Latest Ref Range: 5 - 15  7  GFR, Est Non African American Latest Ref Range: >60 mL/min >60  GFR, Est African American Latest Ref Range: >60 mL/min >60   Results for DEVION, SABAL (MRN [id number]) as of 08/20/2019 14:02  Ref. Range 08/02/2019 06:04  WBC Latest Ref Range: 4.0 - 10.5 K/uL 14.5 (H)  RBC Latest Ref Range: 4.22 - 5.81 MIL/uL 3.61 (L)  Hemoglobin Latest Ref Range: 13.0 - 17.0 g/dL 11.2 (L)  HCT Latest Ref Range: 39.0 - 52.0 % 35.0 (L)  MCV Latest Ref Range: 80.0 - 100.0 fL 97.0  MCH Latest Ref Range: 26.0 - 34.0 pg 31.0  MCHC Latest Ref Range: 30.0 - 36.0 g/dL 32.0  RDW Latest Ref Range: 11.5 - 15.5 % 14.6  Platelets Latest Ref Range: 150 - 400 K/uL 227  nRBC Latest Ref Range: 0.0 - 0.2 % 0.0   Review of Systems  Constitution: Negative for decreased appetite, malaise/fatigue, weight gain and weight loss.  HENT: Negative for congestion.   Eyes: Negative for visual disturbance.  Cardiovascular: Positive for leg swelling. Negative for chest pain, dyspnea on exertion, palpitations and syncope.  Respiratory: Negative for shortness of breath.    Endocrine: Negative for cold intolerance.  Hematologic/Lymphatic: Does not bruise/bleed easily.  Skin: Negative for itching and rash.  Musculoskeletal: Negative for myalgias.  Gastrointestinal: Negative for abdominal pain, nausea and vomiting.  Genitourinary: Negative for dysuria.  Neurological: Negative for dizziness and weakness.  Psychiatric/Behavioral: The patient is not nervous/anxious.   All other systems reviewed and are negative.      Objective:   Filed Weights   10/22/19 1106  Weight: 220 lb (99.8 kg)      Physical Exam   Not performed. Telephone visit.      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  S/P redo bioprosthetic AVR 21 mm (07/2018): S/p redo Bioprosthetic aortic valve for severe patient prosthesis mismatch.    Heart failure with mild reduced ejection fraction: Significant improvement in symptoms on current medical therapy.   Coronary artery disease involving native coronary artery of native heart without angina pectoris Nonobstructive CAD. Continue Aspirin 81 mg, lipitor 40 mg, carvedilol 6.25 bid  F/u w/me in 6 months.  Nigel Mormon, MD [facility] Pager: [phone] Office: [phone] If no answer Cell [phone]

## 2019-11-15 ENCOUNTER — Other Ambulatory Visit: Payer: Self-pay | Admitting: Cardiology

## 2019-11-15 DIAGNOSIS — R600 Localized edema: Secondary | ICD-10-CM

## 2019-11-18 ENCOUNTER — Telehealth: Payer: Self-pay

## 2019-11-18 NOTE — Telephone Encounter (Signed)
Spironolactone 25 mg daily

## 2019-11-18 NOTE — Telephone Encounter (Signed)
Resume torsemide 20 mg twice daily 60 pills X3 reflls.   Please schedule f/u w/AK in 3-4 weeks

## 2019-11-18 NOTE — Telephone Encounter (Signed)
Telephone encounter:  Reason for call: Pt called and says he is having leg swelling like he was before and they are leaking water and wants to know if you can give him a stronger water pill?  Usual provider: MP  Last office visit: 10/22/2019  Next office visit: 04/21/2020   Last hospitalization:    Current Outpatient Medications on File Prior to Visit  Medication Sig Dispense Refill  . albuterol (PROVENTIL HFA;VENTOLIN HFA) 108 (90 Base) MCG/ACT inhaler Inhale 2 puffs into the lungs every 6 (six) hours as needed (wheezing/shortness of breath).     Marland Kitchen aspirin EC 81 MG tablet Take 1 tablet (81 mg total) by mouth daily. 90 tablet 3  . atorvastatin (LIPITOR) 40 MG tablet Take 40 mg by mouth at bedtime.     . carvedilol (COREG) 6.25 MG tablet Take 1 tablet (6.25 mg total) by mouth 2 (two) times daily with a meal. 60 tablet 1  . clonazePAM (KLONOPIN) 1 MG tablet Take 1 mg by mouth at bedtime.     . cyclobenzaprine (FLEXERIL) 10 MG tablet Take 1 tablet by mouth daily.    . Fluticasone-Umeclidin-Vilant (TRELEGY ELLIPTA) 100-62.5-25 MCG/INH AEPB Inhale 1 puff into the lungs daily. (Patient taking differently: Inhale 2 puffs into the lungs daily. ) 60 each 3  . levothyroxine (SYNTHROID, LEVOTHROID) 200 MCG tablet Take 200 mcg by mouth daily.     Marland Kitchen losartan (COZAAR) 25 MG tablet Take 25 mg by mouth at bedtime.     . OXYGEN Inhale into the lungs as needed.    . sertraline (ZOLOFT) 100 MG tablet Take 100 mg by mouth at bedtime.     Marland Kitchen spironolactone (ALDACTONE) 25 MG tablet Take 1 tablet (25 mg total) by mouth daily. 30 tablet 3  . SUBOXONE 8-2 MG FILM Place 1 Film under the tongue 2 (two) times daily.   0  . Tiotropium Bromide Monohydrate (SPIRIVA RESPIMAT) 2.5 MCG/ACT AERS Inhale 1 puff into the lungs daily.    Marland Kitchen torsemide (DEMADEX) 20 MG tablet TAKE 2 TABLETS BY MOUTH EVERY DAY 180 tablet 1   No current facility-administered medications on file prior to visit.

## 2019-11-18 NOTE — Telephone Encounter (Signed)
Pt aware he already has torsemide and he will make the f/u appt

## 2019-11-18 NOTE — Telephone Encounter (Signed)
Can you confirm if he is taking spironolactone 25 mg daily and torsemide 20 mg bid?

## 2019-12-09 ENCOUNTER — Ambulatory Visit: Payer: Medicare Other | Admitting: Cardiology

## 2019-12-16 ENCOUNTER — Ambulatory Visit (INDEPENDENT_AMBULATORY_CARE_PROVIDER_SITE_OTHER): Payer: Medicare Other | Admitting: Cardiology

## 2019-12-16 ENCOUNTER — Other Ambulatory Visit: Payer: Self-pay

## 2019-12-16 ENCOUNTER — Encounter: Payer: Self-pay | Admitting: Cardiology

## 2019-12-16 VITALS — BP 101/63 | HR 98 | Temp 97.0°F | Ht 71.0 in | Wt 218.0 lb

## 2019-12-16 DIAGNOSIS — Z952 Presence of prosthetic heart valve: Secondary | ICD-10-CM | POA: Diagnosis not present

## 2019-12-16 DIAGNOSIS — M7989 Other specified soft tissue disorders: Secondary | ICD-10-CM | POA: Diagnosis not present

## 2019-12-16 DIAGNOSIS — I5020 Unspecified systolic (congestive) heart failure: Secondary | ICD-10-CM

## 2019-12-16 NOTE — Progress Notes (Signed)
Patient is here for follow up visit.  Subjective:   Henry Sheppard, male    DOB: 04/24/1966, 54 y.o.   MRN: [id number]   Chief Complaint  Patient presents with  . Leg Swelling  . Coronary Artery Disease  . 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  Echocardiogram on 09/03/2019 showed EF 40-45%, normal functioning bioprosthetic aortic valve.  While he has had improvement in his weight and shortness of breath since surgery, he continues to have bilateral leg swelling.  He was started on Aldactone at his last office visit, but did not notice any significant improvement in leg edema, he has has this.  He later called our office regarding worsening leg edema and was placed back on torsemide 20 mg twice daily.  Since doing so he has noticed improvement in leg edema, but continues to have issues with this.  No worsening dyspnea.   Past Medical History:  Diagnosis Date  . Anxiety   . Arthritis   . COPD (chronic obstructive pulmonary disease) ([place])   . Depression   . Dyspnea   . H/O colonoscopy 2018   NORMAL  . H/O echocardiogram 10/07/2018   MODERATE TO SEVERE  AORTIC VALVE STENOSIS..EF 40-50%  . H/O endoscopy 2018  . Heart murmur   . Hodgkins lymphoma ([place])    in remission  . Hyperlipidemia, group A   . Hypertension, benign   . Left bundle branch block   . Narcotic dependence, in remission ([place])   . Pneumonia     Past Surgical History:  Procedure Laterality Date  . AORTIC VALVE REPLACEMENT     21 mm pericardial tissue valve 2011 in [place]  . ASCENDING AORTIC ROOT  REPLACEMENT N/A 07/29/2019   Procedure: ASCENDING AORTIC PORCINE ROOT REPLACEMENT USING MEDTRONIC FREESTYLE AORTIC ROOT SIZE 44mm and HEMOSHIELD STRAIGHT PLATINUM GRAFT SIZE 24;  Surgeon: [name], MD;  Location: [facility];  Service: Open Heart Surgery;  Laterality: N/A;  . CARDIAC CATHETERIZATION     12/22/18: 40% proximal RCA, normal LM, LAD, LCX  . CARDIAC VALVE REPLACEMENT     AVR pericardial tissue valve 2011 ([place])  . collarbone surgery Right   . CORONARY ARTERY BYPASS GRAFT    . lymph angiogram     when pt.  was 17 yrs. old  . RIGHT/LEFT HEART CATH AND CORONARY ANGIOGRAPHY N/A 12/22/2018   Procedure: RIGHT/LEFT HEART CATH AND CORONARY ANGIOGRAPHY;  Surgeon: Nigel Mormon, MD;  Location: [facility];  Service: Cardiovascular;  Laterality: N/A;  . SPLENECTOMY    . TEE WITHOUT CARDIOVERSION N/A 12/22/2018   Procedure: TRANSESOPHAGEAL ECHOCARDIOGRAM (TEE);  Surgeon: Nigel Mormon, MD;  Location: [facility];  Service: Cardiovascular;  Laterality: N/A;  . TEE WITHOUT CARDIOVERSION N/A 07/29/2019   Procedure: TRANSESOPHAGEAL ECHOCARDIOGRAM (TEE);  Surgeon: [name], MD;  Location: [facility];  Service: Open Heart Surgery;  Laterality: N/A;  . TOTAL THYROIDECTOMY       Social History   Socioeconomic History  . Marital status: Married    Spouse name: Not on file  . Number of children: 3  . Years of education:  Not on file  . Highest education level: Not on file  Occupational History  . Not on file  Tobacco Use  . Smoking status: Former Smoker    Packs/day: 2.00    Years: 34.00    Pack years: 68.00    Types: Cigarettes    Quit date: 03/16/2019    Years since quitting: 0.7  . Smokeless tobacco: Never Used  . Tobacco comment: 2 ppd  Substance and Sexual Activity  . Alcohol use: Not Currently  . Drug use: Not on file    Comment: smokes 1 joint daily  . Sexual activity: Yes  Other Topics Concern  . Not on file  Social History Narrative  . Not on file    Social Determinants of Health   Financial Resource Strain:   . Difficulty of Paying Living Expenses: Not on file  Food Insecurity:   . Worried About Charity fundraiser in the Last Year: Not on file  . Ran Out of Food in the Last Year: Not on file  Transportation Needs:   . Lack of Transportation (Medical): Not on file  . Lack of Transportation (Non-Medical): Not on file  Physical Activity:   . Days of Exercise per Week: Not on file  . Minutes of Exercise per Session: Not on file  Stress:   . Feeling of Stress : Not on file  Social Connections:   . Frequency of Communication with Friends and Family: Not on file  . Frequency of Social Gatherings with Friends and Family: Not on file  . Attends Religious Services: Not on file  . Active Member of Clubs or Organizations: Not on file  . Attends Archivist Meetings: Not on file  . Marital Status: Not on file  Intimate Partner Violence:   . Fear of Current or Ex-Partner: Not on file  . Emotionally Abused: Not on file  . Physically Abused: Not on file  . Sexually Abused: Not on file     Current Outpatient Medications on File Prior to Visit  Medication Sig Dispense Refill  . albuterol (PROVENTIL HFA;VENTOLIN HFA) 108 (90 Base) MCG/ACT inhaler Inhale 2 puffs into the lungs every 6 (six) hours as needed (wheezing/shortness of breath).     Marland Kitchen aspirin EC 81 MG tablet Take 1 tablet (81 mg total) by mouth daily. 90 tablet 3  . atorvastatin (LIPITOR) 40 MG tablet Take 40 mg by mouth at bedtime.     . carvedilol (COREG) 6.25 MG tablet Take 1 tablet (6.25 mg total) by mouth 2 (two) times daily with a meal. 60 tablet 1  . cyclobenzaprine (FLEXERIL) 10 MG tablet Take 1 tablet by mouth daily.    . diazepam (VALIUM) 5 MG tablet Take 1 tablet by mouth 2 (two) times daily as needed.    . Fluticasone-Umeclidin-Vilant (TRELEGY ELLIPTA) 100-62.5-25 MCG/INH AEPB Inhale 1 puff into the lungs daily. (Patient taking differently: Inhale 2 puffs  into the lungs daily. ) 60 each 3  . ibuprofen (ADVIL) 800 MG tablet Take 1 tablet by mouth daily as needed.    Marland Kitchen levothyroxine (SYNTHROID, LEVOTHROID) 200 MCG tablet Take 200 mcg by mouth daily.     Marland Kitchen losartan (COZAAR) 25 MG tablet Take 25 mg by mouth at bedtime.     . OXYGEN Inhale into the lungs as needed.    . potassium chloride (MICRO-K) 10 MEQ CR capsule Take 10 mEq by mouth 2 (two) times daily.    . sertraline (ZOLOFT) 100 MG tablet Take 100  mg by mouth at bedtime.     . SUBOXONE 8-2 MG FILM Place 1 Film under the tongue 2 (two) times daily.   0  . torsemide (DEMADEX) 20 MG tablet TAKE 2 TABLETS BY MOUTH EVERY DAY 180 tablet 1   No current facility-administered medications on file prior to visit.    Cardiovascular studies:  Echocardiogram 09/03/2019: Left ventricle cavity is normal in size. Mild concentric hypertrophy of the left ventricle. Abnormal septal wall motion due to post-operative valve. Mildly depressed LV systolic function with visual EF 40-45%. Doppler evidence of grade II (pseudonormal) diastolic dysfunction, elevated LAP. Calculated EF 41%. Bioprosthetic aortic valve with redo aortic valve replacement. Well seated valve with no valvular or paravalvular leak. Mean PG of 10 mmHg likely baseline normal for this valve. AVA 1.7 cm2, VmAx 2.1 m/sec. Mild calcification of the mitral valve annulus and leaflet. Mildly restricted mitral valve leaflets with no significant stenosis.  Trace mitral regurgitation. Mild to moderate tricuspid regurgitation. Estimated pulmonary artery systolic pressure is 37 mmHg.  Compared to previous study on 10/07/2018, severe prosthetic valve stenosis is now resolved after redo bioprosthetic aortic valve replacement.   EKG 08/20/2019: Sinus rhythm 73 bpm. Left bundle branch block.  Unchanged from previous EKG.  Perioperative TEE 08/07/2018: - Left Ventricle: The left ventricle is unchanged from pre-bypass. - Aorta: A graft was placed in the  ascending aorta for repair. - Left Atrial Appendage: The left atrial appendage appears unchanged from pre-bypass. - Aortic Valve: A bioprosthetic valve was placed, leaflets are thin and freely mobile. No regurgitation post repair. The gradient recorded across the prosthetic valve is 15 mmHg. No perivalvular leak noted. - Mitral Valve: The mitral valve appears unchanged from pre-bypass. - Tricuspid Valve: The tricuspid valve appears unchanged from pre-bypass. - Interatrial Septum: The interatrial septum appears unchanged from pre-bypass. - Pericardium: The pericardium appears unchanged from pre-bypass.  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  TEE 12/22/2018: - Left ventricle: There was moderate concentric hypertrophy.   Systolic function was normal. The estimated ejection fraction was   in the range of 55% to 60%. Wall motion was normal; there were no   regional wall motion abnormalities. - Aortic valve: Well seated 21 mm Carpentier-Edwards bioprosthetic   aortic valve. Mild pannus formation. Normal excursion of aortic   valve leaflets.   Severe prosthetic valve stenosis. Mean PG 56 mmHg. Vmax 4.5   m/sec. Acceleration time 148 msec.   Higher DVI likely suggests subvalvular narrowing/small annulus. - Mitral valve: Mildly calcified annulus. There was mild   regurgitation. - Tricuspid valve: There was mild regurgitation. Peak RV-RA   gradient (S): 28 mm Hg.  Labs: Results for SIRAJ, HUTCHINGS (MRN [id number]) as of 08/20/2019 14:02  Ref. Range 07/31/2019 05:03   Sodium Latest Ref Range: 135 - 145 mmol/L 137  Potassium Latest Ref Range: 3.5 - 5.1 mmol/L 3.9  Chloride Latest Ref Range: 98 - 111 mmol/L 101  CO2 Latest Ref Range: 22 - 32 mmol/L 29  Glucose Latest Ref Range: 70 - 99 mg/dL 118 (H)  BUN Latest Ref Range: 6 - 20 mg/dL 6  Creatinine Latest Ref Range: 0.61 - 1.24  mg/dL 0.58 (L)  Calcium Latest Ref Range: 8.9 - 10.3 mg/dL 8.6 (L)  Anion gap Latest Ref Range: 5 - 15  7  GFR, Est Non African American Latest Ref Range: >60 mL/min >60  GFR, Est African American Latest Ref Range: >60 mL/min >60   Results for TSHOMBE, DOOLIN (MRN [id number]) as of 08/20/2019 14:02  Ref. Range 08/02/2019 06:04  WBC Latest Ref Range: 4.0 - 10.5 K/uL 14.5 (H)  RBC Latest Ref Range: 4.22 - 5.81 MIL/uL 3.61 (L)  Hemoglobin Latest Ref Range: 13.0 - 17.0 g/dL 11.2 (L)  HCT Latest Ref Range: 39.0 - 52.0 % 35.0 (L)  MCV Latest Ref Range: 80.0 - 100.0 fL 97.0  MCH Latest Ref Range: 26.0 - 34.0 pg 31.0  MCHC Latest Ref Range: 30.0 - 36.0 g/dL 32.0  RDW Latest Ref Range: 11.5 - 15.5 % 14.6  Platelets Latest Ref Range: 150 - 400 K/uL 227  nRBC Latest Ref Range: 0.0 - 0.2 % 0.0   Review of Systems  Constitution: Negative for decreased appetite, malaise/fatigue, weight gain and weight loss.  HENT: Negative for congestion.   Eyes: Negative for visual disturbance.  Cardiovascular: Positive for leg swelling. Negative for chest pain, dyspnea on exertion, palpitations and syncope.  Respiratory: Negative for shortness of breath.   Endocrine: Negative for cold intolerance.  Hematologic/Lymphatic: Does not bruise/bleed easily.  Skin: Negative for itching and rash.  Musculoskeletal: Negative for myalgias.  Gastrointestinal: Negative for abdominal pain, nausea and vomiting.  Genitourinary: Negative for dysuria.  Neurological: Negative for dizziness and weakness.  Psychiatric/Behavioral: The patient is not nervous/anxious.   All other systems reviewed and are negative.       Objective:    Vitals:   12/16/19 1122  BP: 101/63  Pulse: 98  Temp: (!) 97 F (36.1 C)  SpO2: 97%   Filed Weights   12/16/19 1122  Weight: 218 lb (98.9 kg)      Physical Exam  Constitutional: He is oriented to person, place, and time. He appears well-developed and well-nourished. No distress.  HENT:  Head: Normocephalic and atraumatic.  Eyes: Pupils are equal, round, and reactive to light. Conjunctivae are normal.  Neck: No JVD present.  Cardiovascular: Normal rate and regular rhythm.  Murmur (II/VI crescendo decrescendo murmur RUSB. Improved since redo AVR) heard. Pulmonary/Chest: Effort normal and breath sounds normal. He has no wheezes. He has no rales.  Abdominal: Soft. Bowel sounds are normal. There is no rebound.  Musculoskeletal:        General: Edema (2+ b/l) present.  Lymphadenopathy:    He has no cervical adenopathy.  Neurological: He is alert and oriented to person, place, and time. No cranial nerve deficit.  Skin: Skin is warm and dry.  Psychiatric: He has a normal mood and affect.  Nursing note and vitals reviewed.       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  Here for follow-up for leg edema.  Bilateral leg edema He had improvement in leg edema with torsemide 20 mg twice daily.  I reviewed his labs from PCP office, kidney function has been stable.  Will further increase his torsemide to 60 mg daily, he  will take 40 mg a.m. and 20 mg around 3 PM.  He will need repeat labs in 10 days.  He prefers to have this performed at PCP office and he has upcoming  appointment with them on 2/17.  We will asked that his labs be performed at that time.  S/P redo bioprosthetic AVR 21 mm (07/2018): S/p redo Bioprosthetic aortic valve for severe patient prosthesis mismatch.    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  Although he now is status post redo AVR, he continues to have NYHA class II-3 heart failure symptoms.  No worsening dyspnea. I have not added back his Aldactone as he states that this did not help his symptoms.  Could consider addition of this in view of his heart failure.  We will plan to see him back in 4 weeks for follow-up on leg edema, but encouraged him to contact us sooner if needed.   Miquel Dunn, MSN, APRN, FNP-C [facility]. [place] Office: [phone] Fax: [phone]

## 2019-12-16 NOTE — Patient Instructions (Signed)
BMP in 10 days to check kidney function  Increase torsemide to 60 mg daily (40 mg AM and 20 mg PM)  Take extra dose potassium in the AM( 1 tab AM and 2 tab PM)

## 2019-12-17 ENCOUNTER — Encounter: Payer: Self-pay | Admitting: Cardiology

## 2020-01-13 ENCOUNTER — Encounter: Payer: Self-pay | Admitting: Cardiology

## 2020-01-13 ENCOUNTER — Other Ambulatory Visit: Payer: Self-pay

## 2020-01-13 ENCOUNTER — Ambulatory Visit: Payer: Medicare Other | Admitting: Cardiology

## 2020-01-13 VITALS — BP 99/57 | HR 107 | Temp 97.8°F | Ht 71.0 in | Wt 225.0 lb

## 2020-01-13 DIAGNOSIS — M7989 Other specified soft tissue disorders: Secondary | ICD-10-CM | POA: Insufficient documentation

## 2020-01-13 DIAGNOSIS — I5020 Unspecified systolic (congestive) heart failure: Secondary | ICD-10-CM

## 2020-01-13 DIAGNOSIS — I2510 Atherosclerotic heart disease of native coronary artery without angina pectoris: Secondary | ICD-10-CM

## 2020-01-13 DIAGNOSIS — Z952 Presence of prosthetic heart valve: Secondary | ICD-10-CM

## 2020-01-13 NOTE — Progress Notes (Signed)
Patient is here for follow up visit.  Subjective:   Henry Sheppard, male    DOB: 1966-03-25, 54 y.o.   MRN: [id number]   Chief Complaint  Patient presents with  . Leg Swelling  . 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  Echocardiogram on 09/03/2019 showed EF 40-45%, normal functioning bioprosthetic aortic valve. He continues to have off and on leg edema and shortness of breath symptoms, but much improved compared to prior. He has been a smoker most of his life, but does not know if he has COPD. He also has significant back issues. His pain, and breathing improve with improvement in pain symptoms.   He is currently using torsemide 20 mg 2 tabs in am. Recent labs with PCP show normal GFR and electrolytes.    Current Outpatient Medications on File Prior to Visit  Medication Sig Dispense Refill  . albuterol (PROVENTIL HFA;VENTOLIN HFA) 108 (90 Base) MCG/ACT inhaler Inhale 2 puffs into the lungs every 6 (six) hours as needed (wheezing/shortness of breath).     Marland Kitchen aspirin EC 81 MG tablet Take 1 tablet (81 mg total) by mouth daily. 90 tablet 3  . atorvastatin (LIPITOR) 40 MG tablet Take 40 mg by mouth at bedtime.     . carvedilol (COREG) 6.25 MG tablet Take 1 tablet (6.25 mg total) by mouth 2 (two) times daily with a meal. 60 tablet 1  . clonazePAM (KLONOPIN) 1 MG tablet Take 1 mg by mouth daily.    . diazepam (VALIUM) 5 MG tablet Take 1 tablet by mouth 2 (two) times daily as needed.    Marland Kitchen ibuprofen (ADVIL) 800 MG tablet Take 1 tablet by mouth daily as needed.    Marland Kitchen levothyroxine (SYNTHROID,  LEVOTHROID) 200 MCG tablet Take 200 mcg by mouth daily.     Marland Kitchen losartan (COZAAR) 25 MG tablet Take 25 mg by mouth at bedtime.     . OXYGEN Inhale into the lungs as needed.    . potassium chloride (MICRO-K) 10 MEQ CR capsule Take 10 mEq by mouth 2 (two) times daily.    . sertraline (ZOLOFT) 100 MG tablet Take 100 mg by mouth at bedtime.     . SUBOXONE 8-2 MG FILM Place 1 Film under the tongue 2 (two) times daily.   0  . torsemide (DEMADEX) 20 MG tablet TAKE 2 TABLETS BY MOUTH EVERY DAY 180 tablet 1   No current facility-administered medications on file prior to visit.    Cardiovascular studies:  Echocardiogram 09/03/2019: Left ventricle cavity is normal in size. Mild concentric hypertrophy of the left ventricle. Abnormal septal wall motion due to post-operative valve. Mildly depressed LV systolic function with visual EF 40-45%. Doppler evidence of grade II (pseudonormal) diastolic dysfunction, elevated LAP. Calculated EF 41%. Bioprosthetic aortic valve with redo aortic valve replacement. Well seated valve with no valvular or paravalvular leak. Mean PG of 10 mmHg likely baseline normal for this valve. AVA 1.7 cm2, VmAx 2.1 m/sec. Mild calcification of the mitral valve annulus and leaflet. Mildly restricted mitral valve leaflets with no significant stenosis.  Trace mitral regurgitation. Mild to moderate tricuspid regurgitation. Estimated pulmonary artery  systolic pressure is 37 mmHg.  Compared to previous study on 10/07/2018, severe prosthetic valve stenosis is now resolved after redo bioprosthetic aortic valve replacement.   EKG 08/20/2019: Sinus rhythm 73 bpm. Left bundle branch block.  Unchanged from previous EKG.  Perioperative TEE 08/07/2018: - Left Ventricle: The left ventricle is unchanged from pre-bypass. - Aorta: A graft was placed in the ascending aorta for repair. - Left Atrial Appendage: The left atrial appendage appears unchanged from pre-bypass. - Aortic Valve: A bioprosthetic  valve was placed, leaflets are thin and freely mobile. No regurgitation post repair. The gradient recorded across the prosthetic valve is 15 mmHg. No perivalvular leak noted. - Mitral Valve: The mitral valve appears unchanged from pre-bypass. - Tricuspid Valve: The tricuspid valve appears unchanged from pre-bypass. - Interatrial Septum: The interatrial septum appears unchanged from pre-bypass. - Pericardium: The pericardium appears unchanged from pre-bypass.  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  TEE 12/22/2018: - Left ventricle: There was moderate concentric hypertrophy.   Systolic function was normal. The estimated ejection fraction was   in the range of 55% to 60%. Wall motion was normal; there were no   regional wall motion abnormalities. - Aortic valve: Well seated 21 mm Carpentier-Edwards bioprosthetic   aortic valve. Mild pannus formation. Normal excursion of aortic   valve leaflets.   Severe prosthetic valve stenosis. Mean PG 56 mmHg. Vmax 4.5   m/sec. Acceleration time 148 msec.   Higher DVI likely suggests subvalvular narrowing/small annulus. - Mitral valve: Mildly calcified annulus. There was mild   regurgitation. - Tricuspid valve: There was mild regurgitation. Peak RV-RA   gradient (S): 28 mm Hg.  Labs: 07/31/2019: Glucose 118, BUN/Cr 6/0.58. EGFR >60. Na/K 137/3.9.  H/H 11/35. MCV 97. Platelets 227  Review of Systems  Cardiovascular: Positive for dyspnea on exertion (Improved) and leg swelling (Improved). Negative for chest pain, palpitations  and syncope.       Objective:    Vitals:   01/13/20 1358  BP: (!) 99/57  Pulse: (!) 107  Temp: 97.8 F (36.6 C)  SpO2: 96%   Filed Weights   01/13/20 1358  Weight: 225 lb (102.1 kg)      Physical Exam  Constitutional: No distress.  Neck: No JVD present.  Cardiovascular: Normal rate and regular rhythm.  Murmur (II/VI crescendo decrescendo murmur RUSB. Improved since redo AVR) heard. Pulmonary/Chest: Effort normal and breath sounds normal. He has no rales.  Musculoskeletal:        General: Edema (1+ b/l) present.  Neurological: No cranial nerve deficit.  Nursing note and vitals reviewed.       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  Here for follow-up for leg edema.  Bilateral leg edema: Improved. Continue torsemide 20 mg 2 tabs in am, and additional tab in pm as necessary.   S/P redo  bioprosthetic AVR 21 mm (07/2018): S/p redo Bioprosthetic aortic valve for severe patient prosthesis mismatch.   Inherent gradient and murmur normal after redo AVR.   Heart failure with mild reduced ejection fraction: Continue losartan, torsemide. Unable to up tirate meds with borderline low blood pressure. However, he has had clinical improvement.  Continue management of possible COPD.   F/u in July 2021.  Nigel Mormon, MD [facility] [place] Pager: [phone] Office: [phone]

## 2020-02-02 DIAGNOSIS — Z9981 Dependence on supplemental oxygen: Secondary | ICD-10-CM

## 2020-03-08 ENCOUNTER — Other Ambulatory Visit: Payer: Self-pay

## 2020-03-08 ENCOUNTER — Encounter: Payer: Self-pay | Admitting: Pulmonary Disease

## 2020-03-08 ENCOUNTER — Ambulatory Visit (INDEPENDENT_AMBULATORY_CARE_PROVIDER_SITE_OTHER): Payer: Medicare HMO | Admitting: Pulmonary Disease

## 2020-03-08 VITALS — BP 112/82 | HR 68 | Ht 71.5 in | Wt 224.2 lb

## 2020-03-08 DIAGNOSIS — F17200 Nicotine dependence, unspecified, uncomplicated: Secondary | ICD-10-CM

## 2020-03-08 DIAGNOSIS — Z951 Presence of aortocoronary bypass graft: Secondary | ICD-10-CM | POA: Diagnosis not present

## 2020-03-08 DIAGNOSIS — Z9009 Acquired absence of other part of head and neck: Secondary | ICD-10-CM

## 2020-03-08 DIAGNOSIS — I5020 Unspecified systolic (congestive) heart failure: Secondary | ICD-10-CM

## 2020-03-08 DIAGNOSIS — J449 Chronic obstructive pulmonary disease, unspecified: Secondary | ICD-10-CM | POA: Diagnosis not present

## 2020-03-08 DIAGNOSIS — Z9081 Acquired absence of spleen: Secondary | ICD-10-CM

## 2020-03-08 DIAGNOSIS — E890 Postprocedural hypothyroidism: Secondary | ICD-10-CM

## 2020-03-08 DIAGNOSIS — Z8571 Personal history of Hodgkin lymphoma: Secondary | ICD-10-CM | POA: Diagnosis not present

## 2020-03-08 MED ORDER — ALBUTEROL SULFATE HFA 108 (90 BASE) MCG/ACT IN AERS: 2.0000 | INHALATION_SPRAY | Freq: Four times a day (QID) | RESPIRATORY_TRACT | 6 refills | Status: AC | PRN

## 2020-03-08 MED ORDER — BREZTRI AEROSPHERE 160-9-4.8 MCG/ACT IN AERO: 2.0000 | INHALATION_SPRAY | Freq: Two times a day (BID) | RESPIRATORY_TRACT | 0 refills | Status: AC

## 2020-03-08 NOTE — Progress Notes (Signed)
Synopsis: Referred in September 2019 for COPD evaluation by Reed Breech, NP  Subjective:   PATIENT ID: Henry Sheppard GENDER: male DOB: 1966/06/14, MRN: [id number]  Chief Complaint  Patient presents with  . Follow-up    Pt last seen 07/30/18. Pt has had complaints of SOB which he states bothers him all the time and also has an occ cough with white to gray mucus.    [place]. He has been diagnosed with COPD a few years ago. He was seeing a pulmonologist in [place]. He is working on quitting smoking. Plans to start chantix with his PCP. Currently established care with [place].   Ov 07/2018: He had a broken collar bone and went for stress test prior which he failed and led to CABG with Valve replacement. He was able to stop smoking for about 2 years. He had a splenectomy at age 79. Unsure if vaccinated. Does not like getting a flu shot because he always gets sick. On occasion he needs oxygen and feels breathless. Climbing stairs makes this worse. Routinely has phlegm production. He has had 1 hospitalization for COPD that was 3 years ago. Has had PFTs in the past. Last chest imaging was in May 2019. (?[place] [facility])   OV 03/08/2020: Patient here today for follow-up.  Patient was referred again to the pulmonary office as he was lost to follow-up after last visit with me in 2019.  Referral again for hypoxemic respiratory failure.  No previous PFTs in our system but documented from last note had them in May 2019.  Still do not have these records.  Today, unfortunately states that he has started smoking again. He is working at his home and redoing furniture. He is still smoking 1ppd. He presents today without oxygen. He checks his O2 regularly. He wears his oxygen at night time but is not using it during the day. He doesn't like wearing it. He is using only albuterol.  He is very depressed today that he still smoking but he states that he has  been working on it for several months and has yet to be able to quit.   Past Medical History:  Diagnosis Date  . Anxiety   . Arthritis   . COPD (chronic obstructive pulmonary disease) ([place])   . Depression   . Dyspnea   . H/O colonoscopy 2018   NORMAL  . H/O echocardiogram 10/07/2018   MODERATE TO SEVERE  AORTIC VALVE STENOSIS..EF 40-50%  . H/O endoscopy 2018  . Heart murmur   . Hodgkins lymphoma ([place])    in remission  . Hyperlipidemia, group A   . Hypertension, benign   . Left bundle branch block   . Narcotic dependence, in remission ([place])   . Pneumonia      Family History  Problem Relation Age of Onset  . Cancer Father   . Cancer Mother   . Asthma Brother   . Asthma Brother   . Lung disease Neg Hx      Past Surgical History:  Procedure Laterality Date  . AORTIC VALVE REPLACEMENT     21 mm pericardial tissue valve 2011 in [place]  . ASCENDING AORTIC ROOT REPLACEMENT N/A 07/29/2019   Procedure: ASCENDING AORTIC PORCINE ROOT REPLACEMENT USING MEDTRONIC FREESTYLE AORTIC ROOT SIZE 66m and HEMOSHIELD STRAIGHT PLATINUM GRAFT SIZE 24;  Surgeon: BGaye Pollack MD;  [place] [facility]  Service: Open Heart Surgery;  Laterality: N/A;  . CARDIAC  CATHETERIZATION     12/22/18: 40% proximal RCA, normal LM, LAD, LCX  . CARDIAC VALVE REPLACEMENT     AVR pericardial tissue valve 2011 ([facility])  . collarbone surgery Right   . CORONARY ARTERY BYPASS GRAFT    . lymph angiogram     when pt.  was 17 yrs. old  . RIGHT/LEFT HEART CATH AND CORONARY ANGIOGRAPHY N/A 12/22/2018   Procedure: RIGHT/LEFT HEART CATH AND CORONARY ANGIOGRAPHY;  Surgeon: Nigel Mormon, MD;  Location: [facility];  Service: Cardiovascular;  Laterality: N/A;  . SPLENECTOMY    . TEE WITHOUT CARDIOVERSION N/A 12/22/2018   Procedure: TRANSESOPHAGEAL ECHOCARDIOGRAM (TEE);  Surgeon: Nigel Mormon, MD;  Location: [facility]OS[facility];  Service: Cardiovascular;  Laterality: N/A;  . TEE WITHOUT CARDIOVERSION N/A  07/29/2019   Procedure: TRANSESOPHAGEAL ECHOCARDIOGRAM (TEE);  Surgeon: [name], MD;  Location: [facility];  Service: Open Heart Surgery;  Laterality: N/A;  . TOTAL THYROIDECTOMY      Social History   Socioeconomic History  . Marital status: Married    Spouse name: Not on file  . Number of children: 3  . Years of education: Not on file  . Highest education level: Not on file  Occupational History  . Not on file  Tobacco Use  . Smoking status: Current Every Day Smoker    Packs/day: 2.00    Years: 34.00    Pack years: 68.00    Types: Cigarettes    Last attempt to quit: 03/16/2019    Years since quitting: 0.9  . Smokeless tobacco: Never Used  . Tobacco comment: started smoking 2 months ago doing about 1ppd as of 03/08/20  Substance and Sexual Activity  . Alcohol use: Not Currently  . Drug use: Not on file    Comment: smokes 1 joint daily  . Sexual activity: Yes  Other Topics Concern  . Not on file  Social History Narrative  . Not on file   Social Determinants of Health   Financial Resource Strain:   . Difficulty of Paying Living Expenses:   Food Insecurity:   . Worried About Charity fundraiser in the Last Year:   . Arboriculturist in the Last Year:   Transportation Needs:   . Film/video editor (Medical):   Marland Kitchen Lack of Transportation (Non-Medical):   Physical Activity:   . Days of Exercise per Week:   . Minutes of Exercise per Session:   Stress:   . Feeling of Stress :   Social Connections:   . Frequency of Communication with Friends and Family:   . Frequency of Social Gatherings with Friends and Family:   . Attends Religious Services:   . Active Member of Clubs or Organizations:   . Attends Archivist Meetings:   Marland Kitchen Marital Status:   Intimate Partner Violence:   . Fear of Current or Ex-Partner:   . Emotionally Abused:   Marland Kitchen Physically Abused:   . Sexually Abused:      No Known Allergies   Outpatient Medications Prior to Visit  Medication Sig  Dispense Refill  . albuterol (PROVENTIL HFA;VENTOLIN HFA) 108 (90 Base) MCG/ACT inhaler Inhale 2 puffs into the lungs every 6 (six) hours as needed (wheezing/shortness of breath).     Marland Kitchen aspirin EC 81 MG tablet Take 1 tablet (81 mg total) by mouth daily. 90 tablet 3  . atorvastatin (LIPITOR) 40 MG tablet Take 40 mg by mouth daily.    . carvedilol (COREG) 6.25  MG tablet Take 1 tablet (6.25 mg total) by mouth 2 (two) times daily with a meal. 60 tablet 1  . clonazePAM (KLONOPIN) 1 MG tablet Take 1 mg by mouth daily.    . diazepam (VALIUM) 5 MG tablet Take 1 tablet by mouth 2 (two) times daily as needed.    Marland Kitchen ibuprofen (ADVIL) 800 MG tablet Take 1 tablet by mouth daily as needed.    Marland Kitchen levothyroxine (SYNTHROID) 200 MCG tablet Take 200 mcg by mouth daily before breakfast.    . losartan (COZAAR) 25 MG tablet Take 25 mg by mouth at bedtime.     . OXYGEN Inhale 2 L into the lungs daily. 3L 24/7    . potassium chloride (MICRO-K) 10 MEQ CR capsule Take 10 mEq by mouth 2 (two) times daily.    . sertraline (ZOLOFT) 100 MG tablet Take 100 mg by mouth daily.    . SUBOXONE 8-2 MG FILM Place 1 Film under the tongue 2 (two) times daily.   0  . torsemide (DEMADEX) 20 MG tablet TAKE 2 TABLETS BY MOUTH EVERY DAY 180 tablet 1  . atorvastatin (LIPITOR) 40 MG tablet Take 40 mg by mouth at bedtime.     Marland Kitchen levothyroxine (SYNTHROID, LEVOTHROID) 200 MCG tablet Take 200 mcg by mouth daily.     . sertraline (ZOLOFT) 100 MG tablet Take 100 mg by mouth at bedtime.      No facility-administered medications prior to visit.    Review of Systems  Constitutional: Negative for chills, fever, malaise/fatigue and weight loss.  HENT: Negative for hearing loss, sore throat and tinnitus.   Eyes: Negative for blurred vision and double vision.  Respiratory: Positive for cough and shortness of breath. Negative for hemoptysis, sputum production, wheezing and stridor.   Cardiovascular: Positive for leg swelling. Negative for chest pain,  palpitations, orthopnea and PND.  Gastrointestinal: Negative for abdominal pain, constipation, diarrhea, heartburn, nausea and vomiting.  Genitourinary: Negative for dysuria, hematuria and urgency.  Musculoskeletal: Negative for joint pain and myalgias.  Skin: Negative for itching and rash.  Neurological: Negative for dizziness, tingling, weakness and headaches.  Endo/Heme/Allergies: Negative for environmental allergies. Does not bruise/bleed easily.  Psychiatric/Behavioral: Negative for depression. The patient is not nervous/anxious and does not have insomnia.   All other systems reviewed and are negative.    Objective:  Physical Exam Vitals reviewed.  Constitutional:      General: He is not in acute distress.    Appearance: He is well-developed.  HENT:     Head: Normocephalic and atraumatic.     Mouth/Throat:     Pharynx: No oropharyngeal exudate.  Eyes:     Conjunctiva/sclera: Conjunctivae normal.     Pupils: Pupils are equal, round, and reactive to light.  Neck:     Vascular: No JVD.     Trachea: No tracheal deviation.     Comments: Loss of supraclavicular fat Cardiovascular:     Rate and Rhythm: Normal rate and regular rhythm.     Heart sounds: S1 normal and S2 normal. Murmur present.     Comments: Distant heart tones Pulmonary:     Effort: No tachypnea or accessory muscle usage.     Breath sounds: No stridor. Decreased breath sounds (throughout all lung fields) and wheezing present. No rhonchi or rales.  Abdominal:     General: Bowel sounds are normal. There is no distension.     Palpations: Abdomen is soft.     Tenderness: There is no abdominal tenderness.  Musculoskeletal:        General: No deformity (muscle wasting ).  Skin:    General: Skin is warm and dry.     Capillary Refill: Capillary refill takes less than 2 seconds.     Findings: No rash.  Neurological:     Mental Status: He is alert and oriented to person, place, and time.  Psychiatric:         Behavior: Behavior normal.     Vitals:   03/08/20 1153 03/08/20 1156  BP: 112/82   Pulse: 68   SpO2: (!) 81% 95%  Weight: 224 lb 3.2 oz (101.7 kg)   Height: 5' 11.5" (1.816 m)    95% on 3 L BMI Readings from Last 3 Encounters:  03/08/20 30.83 kg/m  01/13/20 31.38 kg/m  12/16/19 30.40 kg/m   Wt Readings from Last 3 Encounters:  03/08/20 224 lb 3.2 oz (101.7 kg)  01/13/20 225 lb (102.1 kg)  12/16/19 218 lb (98.9 kg)   CBC    Component Value Date/Time   WBC 14.5 (H) 08/02/2019 0604   RBC 3.61 (L) 08/02/2019 0604   HGB 11.2 (L) 08/02/2019 0604   HCT 35.0 (L) 08/02/2019 0604   PLT 227 08/02/2019 0604   MCV 97.0 08/02/2019 0604   MCH 31.0 08/02/2019 0604   MCHC 32.0 08/02/2019 0604   RDW 14.6 08/02/2019 0604    No available labs to review  Chest Imaging: Most recent imaging was completed and May 2019 in [place] [facility]. Will request medical records.  Pulmonary Functions Testing Results: TLC  Date Value Ref Range Status  05/24/2019 5.77 L Final    None available to review.  Patient is unsure when his last PFTs were completed.  FeNO: None  Pathology: None  Echocardiogram: None  Heart Catheterization: None to review has had this in the.    Assessment & Plan:   Chronic obstructive pulmonary disease, unspecified COPD type ([place])  Current smoker  Hx of Hodgkin's lymphoma  History of coronary artery bypass graft  History of thyroidectomy  History of splenectomy  HFrEF (heart failure with reduced ejection fraction) ([place])  Discussion:  This is a 54 year old gentleman multiple medical problems history of significant coronary disease and history of aortic valve replacement.  History of Hodgkin's lymphoma, chest radiation neck radiation history of thyroidectomy.  Longstanding history of smoking.  In 2019 he was also smoking.  When we first met.  He states that he still smoking approximately 1 pack/day.  Plan: Patient must quit smoking.  This  is the most important thing that he can do for his lung health and his current complaints of shortness of breath. He is only using albuterol inhaler. He did not continue use of any of his maintenance inhalers. He would like samples of new maintenance inhalers if possible today. He would prefer a HFA or mist inhaler over a dry powder. We will give him samples of Breztri today. We discussed smoking cessation today.  Recommend follow-up with [place] regarding smoking cessation or his primary care office. Walk today in the office for oxygen requirements. We will also send new prescription today for his DME supplier to continue his oxygen therapy. Patient benefits from oxygen therapy.  Greater than 50% of this patient's 25-minute of visit was in face-to-face discussing above recommendations and treatment plan.    Current Outpatient Medications:  .  albuterol (PROVENTIL HFA;VENTOLIN HFA) 108 (90 Base) MCG/ACT inhaler, Inhale 2 puffs into the lungs every 6 (six) hours as needed (  wheezing/shortness of breath). , Disp: , Rfl:  .  aspirin EC 81 MG tablet, Take 1 tablet (81 mg total) by mouth daily., Disp: 90 tablet, Rfl: 3 .  atorvastatin (LIPITOR) 40 MG tablet, Take 40 mg by mouth daily., Disp: , Rfl:  .  carvedilol (COREG) 6.25 MG tablet, Take 1 tablet (6.25 mg total) by mouth 2 (two) times daily with a meal., Disp: 60 tablet, Rfl: 1 .  clonazePAM (KLONOPIN) 1 MG tablet, Take 1 mg by mouth daily., Disp: , Rfl:  .  diazepam (VALIUM) 5 MG tablet, Take 1 tablet by mouth 2 (two) times daily as needed., Disp: , Rfl:  .  ibuprofen (ADVIL) 800 MG tablet, Take 1 tablet by mouth daily as needed., Disp: , Rfl:  .  levothyroxine (SYNTHROID) 200 MCG tablet, Take 200 mcg by mouth daily before breakfast., Disp: , Rfl:  .  losartan (COZAAR) 25 MG tablet, Take 25 mg by mouth at bedtime. , Disp: , Rfl:  .  OXYGEN, Inhale 2 L into the lungs daily. 3L 24/7, Disp: , Rfl:  .  potassium chloride (MICRO-K) 10 MEQ CR capsule,  Take 10 mEq by mouth 2 (two) times daily., Disp: , Rfl:  .  sertraline (ZOLOFT) 100 MG tablet, Take 100 mg by mouth daily., Disp: , Rfl:  .  SUBOXONE 8-2 MG FILM, Place 1 Film under the tongue 2 (two) times daily. , Disp: , Rfl: 0 .  torsemide (DEMADEX) 20 MG tablet, TAKE 2 TABLETS BY MOUTH EVERY DAY, Disp: 180 tablet, Rfl: 1 .  atorvastatin (LIPITOR) 40 MG tablet, Take 40 mg by mouth at bedtime. , Disp: , Rfl:  .  levothyroxine (SYNTHROID, LEVOTHROID) 200 MCG tablet, Take 200 mcg by mouth daily. , Disp: , Rfl:  .  sertraline (ZOLOFT) 100 MG tablet, Take 100 mg by mouth at bedtime. , Disp: , Rfl:    Garner Nash, DO [facility] 03/08/2020 12:05 PM

## 2020-03-08 NOTE — Patient Instructions (Addendum)
Thank you for visiting Dr. Valeta Harms at [facility]. Today we recommend the following:  Samples of brezrti  Refills of albuterol  Walk for O2 requirements likely needs new prescription to DME supplier for O2.   Meds ordered this encounter  Medications  . albuterol (VENTOLIN HFA) 108 (90 Base) MCG/ACT inhaler    Sig: Inhale 2 puffs into the lungs every 6 (six) hours as needed (wheezing/shortness of breath).    Dispense:  18 g    Refill:  6   Return in about 4 months (around 07/08/2020).    Please do your part to reduce the spread of COVID-19.

## 2020-03-08 NOTE — Addendum Note (Signed)
Addended by: Lorretta Harp on: 03/08/2020 01:33 PM   Modules accepted: Orders

## 2020-04-21 ENCOUNTER — Ambulatory Visit: Payer: Medicare Other | Admitting: Cardiology

## 2020-05-09 ENCOUNTER — Other Ambulatory Visit: Payer: Self-pay | Admitting: Cardiology

## 2020-05-09 DIAGNOSIS — R600 Localized edema: Secondary | ICD-10-CM

## 2020-06-01 ENCOUNTER — Other Ambulatory Visit: Payer: Self-pay

## 2020-06-01 ENCOUNTER — Ambulatory Visit: Payer: Medicare HMO | Admitting: Cardiology

## 2020-06-01 ENCOUNTER — Encounter: Payer: Self-pay | Admitting: Cardiology

## 2020-06-01 VITALS — BP 113/65 | HR 60 | Ht 71.0 in | Wt 197.8 lb

## 2020-06-01 DIAGNOSIS — Z952 Presence of prosthetic heart valve: Secondary | ICD-10-CM

## 2020-06-01 DIAGNOSIS — I2510 Atherosclerotic heart disease of native coronary artery without angina pectoris: Secondary | ICD-10-CM

## 2020-06-01 DIAGNOSIS — I5020 Unspecified systolic (congestive) heart failure: Secondary | ICD-10-CM

## 2020-06-01 NOTE — Progress Notes (Signed)
Patient is here for follow up visit.  Subjective:   Henry Sheppard, male    DOB: May 24, 1966, 54 y.o.   MRN: [id number]   Chief Complaint  Patient presents with  . S/p AVR   HPI  54 year old Caucasian male status post bioproshtetic AVR (21 mm pericardial tissue valve), in 2011 in [place], nonobstructive coronary artery disease, h/o Hodgkin's lymphoma in remission, opioid dependence currently on Suboxone, prior orthoedic surgeries, now s/p redo sternotomy and Aortic root replacement using a 21 mm Medtronic freestyle aortic bioprosthesis (07/2018) due to severe patient prosthesis mismatch.  Echocardiogram on 09/03/2019 showed EF 40-45%, normal functioning bioprosthetic aortic valve. His leg edema and shortness of breath have completely resolved. He has lost 28 lbs since his last visit 6 months ago.    Current Outpatient Medications on File Prior to Visit  Medication Sig Dispense Refill  . albuterol (VENTOLIN HFA) 108 (90 Base) MCG/ACT inhaler Inhale 2 puffs into the lungs every 6 (six) hours as needed (wheezing/shortness of breath). 18 g 6  . aspirin EC 81 MG tablet Take 1 tablet (81 mg total) by mouth daily. 90 tablet 3  . atorvastatin (LIPITOR) 40 MG tablet Take 40 mg by mouth at bedtime.     Marland Kitchen atorvastatin (LIPITOR) 40 MG tablet Take 40 mg by mouth daily.    . Budeson-Glycopyrrol-Formoterol (BREZTRI AEROSPHERE) 160-9-4.8 MCG/ACT AERO Inhale 2 puffs into the lungs in the morning and at bedtime. 5.9 g 0  . carvedilol (COREG) 6.25 MG tablet Take 1 tablet (6.25 mg total) by mouth 2 (two) times daily with a meal. 60 tablet 1  . clonazePAM (KLONOPIN) 1 MG tablet Take 1 mg by mouth daily.    . diazepam (VALIUM) 5 MG tablet Take 1 tablet by mouth 2 (two) times daily as needed.    Marland Kitchen ibuprofen (ADVIL) 800 MG tablet Take 1 tablet by mouth daily as needed.    Marland Kitchen levothyroxine (SYNTHROID) 200 MCG tablet Take 200 mcg by mouth daily before breakfast.    . levothyroxine (SYNTHROID, LEVOTHROID)  200 MCG tablet Take 200 mcg by mouth daily.     Marland Kitchen losartan (COZAAR) 25 MG tablet Take 25 mg by mouth at bedtime.     . OXYGEN Inhale 2 L into the lungs daily. 3L 24/7    . potassium chloride (MICRO-K) 10 MEQ CR capsule Take 10 mEq by mouth 2 (two) times daily.    . sertraline (ZOLOFT) 100 MG tablet Take 100 mg by mouth at bedtime.     . sertraline (ZOLOFT) 100 MG tablet Take 100 mg by mouth daily.    . SUBOXONE 8-2 MG FILM Place 1 Film under the tongue 2 (two) times daily.   0  . torsemide (DEMADEX) 20 MG tablet TAKE 2 TABLETS BY MOUTH EVERY DAY 180 tablet 1   No current facility-administered medications on file prior to visit.    Cardiovascular studies:  Echocardiogram 09/03/2019: Left ventricle cavity is normal in size. Mild concentric hypertrophy of the left ventricle. Abnormal septal wall motion due to post-operative valve. Mildly depressed LV systolic function with visual EF 40-45%. Doppler evidence of grade II (pseudonormal) diastolic dysfunction, elevated LAP. Calculated EF 41%. Bioprosthetic aortic valve with redo aortic valve replacement. Well seated valve with no valvular or paravalvular leak. Mean PG of 10 mmHg likely baseline normal for this valve. AVA 1.7 cm2, VmAx 2.1 m/sec. Mild calcification of the mitral valve annulus and leaflet. Mildly restricted mitral valve leaflets with no significant stenosis.  Trace mitral regurgitation. Mild to moderate tricuspid regurgitation. Estimated pulmonary artery systolic pressure is 37 mmHg.  Compared to previous study on 10/07/2018, severe prosthetic valve stenosis is now resolved after redo bioprosthetic aortic valve replacement.   EKG 08/20/2019: Sinus rhythm 73 bpm. Left bundle branch block.  Unchanged from previous EKG.  Perioperative TEE 08/07/2018: - Left Ventricle: The left ventricle is unchanged from pre-bypass. - Aorta: A graft was placed in the ascending aorta for repair. - Left Atrial Appendage: The left atrial appendage  appears unchanged from pre-bypass. - Aortic Valve: A bioprosthetic valve was placed, leaflets are thin and freely mobile. No regurgitation post repair. The gradient recorded across the prosthetic valve is 15 mmHg. No perivalvular leak noted. - Mitral Valve: The mitral valve appears unchanged from pre-bypass. - Tricuspid Valve: The tricuspid valve appears unchanged from pre-bypass. - Interatrial Septum: The interatrial septum appears unchanged from pre-bypass. - Pericardium: The pericardium appears unchanged from pre-bypass.  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  TEE 12/22/2018: - Left ventricle: There was moderate concentric hypertrophy.   Systolic function was normal. The estimated ejection fraction was   in the range of 55% to 60%. Wall motion was normal; there were no   regional wall motion abnormalities. - Aortic valve: Well seated 21 mm Carpentier-Edwards bioprosthetic   aortic valve. Mild pannus formation. Normal excursion of aortic   valve leaflets.   Severe prosthetic valve stenosis. Mean PG 56 mmHg. Vmax 4.5   m/sec. Acceleration time 148 msec.   Higher DVI likely suggests subvalvular narrowing/small annulus. - Mitral valve: Mildly calcified annulus. There was mild   regurgitation. - Tricuspid valve: There was mild regurgitation. Peak RV-RA   gradient (S): 28 mm Hg.  Labs: 07/31/2019: Glucose 118, BUN/Cr 6/0.58. EGFR >60. Na/K 137/3.9.  H/H 11/35. MCV 97. Platelets 227  Review of Systems  Cardiovascular: Positive for dyspnea on exertion  (Improved) and leg swelling (Improved). Negative for chest pain, palpitations and syncope.       Objective:    Vitals:   06/01/20 1207  BP: 113/65  Pulse: 60  SpO2: 94%   Filed Weights   06/01/20 1207  Weight: 197 lb 12.8 oz (89.7 kg)      Physical Exam Vitals and nursing note reviewed.  Constitutional:      General: He is not in acute distress. Neck:     Vascular: No JVD.  Cardiovascular:     Rate and Rhythm: Normal rate and regular rhythm.     Heart sounds: Murmur (II/VI crescendo decrescendo murmur RUSB. Improved since redo AVR) heard.   Pulmonary:     Effort: Pulmonary effort is normal.     Breath sounds: Normal breath sounds. No rales.  Musculoskeletal:     Right lower leg: No edema.     Left lower leg: No edema.  Neurological:     Cranial Nerves: No cranial nerve deficit.         Assessment & Recommendations:    54 year old Caucasian male status post bioproshtetic AVR (21 mm pericardial tissue valve), in 2011 in [place], nonobstructive coronary artery disease, h/o Hodgkin's lymphoma in remission, opioid dependence currently on Suboxone, prior  orthoedic surgeries, now s/p redo sternotomy and Aortic root replacement using a 21 mm Medtronic freestyle aortic bioprosthesis (07/2018) due to severe patient prosthesis mismatch.  Bilateral leg edema: Completely resolved.   S/P redo bioprosthetic AVR 21 mm (07/2018): S/p redo Bioprosthetic aortic valve for severe patient prosthesis mismatch.   Inherent gradient and murmur normal after redo AVR.   Heart failure with mild reduced ejection fraction: Continue losartan, torsemide. Unable to up tirate meds with borderline low blood pressure. However, he has had clinical improvement.  Continue management of possible COPD.   F/u in 1 year after repeat echcoardiogram.  Nigel Mormon, MD [facility] [place] Pager: [phone] Office: [phone]

## 2020-11-06 ENCOUNTER — Other Ambulatory Visit: Payer: Self-pay | Admitting: Cardiology

## 2020-11-06 DIAGNOSIS — R600 Localized edema: Secondary | ICD-10-CM

## 2020-12-05 ENCOUNTER — Other Ambulatory Visit: Payer: Self-pay

## 2020-12-05 ENCOUNTER — Encounter (HOSPITAL_COMMUNITY): Payer: Self-pay

## 2020-12-05 DIAGNOSIS — S81812A Laceration without foreign body, left lower leg, initial encounter: Secondary | ICD-10-CM | POA: Diagnosis not present

## 2020-12-05 DIAGNOSIS — Y92009 Unspecified place in unspecified non-institutional (private) residence as the place of occurrence of the external cause: Secondary | ICD-10-CM | POA: Insufficient documentation

## 2020-12-05 DIAGNOSIS — Z5321 Procedure and treatment not carried out due to patient leaving prior to being seen by health care provider: Secondary | ICD-10-CM | POA: Insufficient documentation

## 2020-12-05 DIAGNOSIS — W268XXA Contact with other sharp object(s), not elsewhere classified, initial encounter: Secondary | ICD-10-CM | POA: Insufficient documentation

## 2020-12-05 DIAGNOSIS — S8992XA Unspecified injury of left lower leg, initial encounter: Secondary | ICD-10-CM | POA: Diagnosis present

## 2020-12-05 NOTE — ED Triage Notes (Signed)
Pt arrived via EMS, from home, states he cut his leg yesterday with piece of wood, 2 lacerations on left lower leg. Believes they have become infected.

## 2020-12-05 NOTE — ED Notes (Signed)
Patients wound redressed.  

## 2020-12-06 ENCOUNTER — Emergency Department (HOSPITAL_COMMUNITY)
Admission: EM | Admit: 2020-12-06 | Discharge: 2020-12-06 | Disposition: A | Discharge status: Home / Self Care | Payer: Medicare Other | Attending: Emergency Medicine | Admitting: Emergency Medicine

## 2020-12-06 NOTE — ED Notes (Signed)
Pt called 3x for room placement. Eloped from waiting area.  

## 2021-01-06 IMAGING — DX DG CHEST 1V PORT
1 series · 1 of 1 positions shown · non-contrast
Comparison: Chest x-ray 07/30/2019.

CLINICAL DATA: 53-year-old male with history of aortic valve
replacement on 07/29/2019. Follow-up study.

EXAM:
PORTABLE CHEST 1 VIEW

[chest ap]
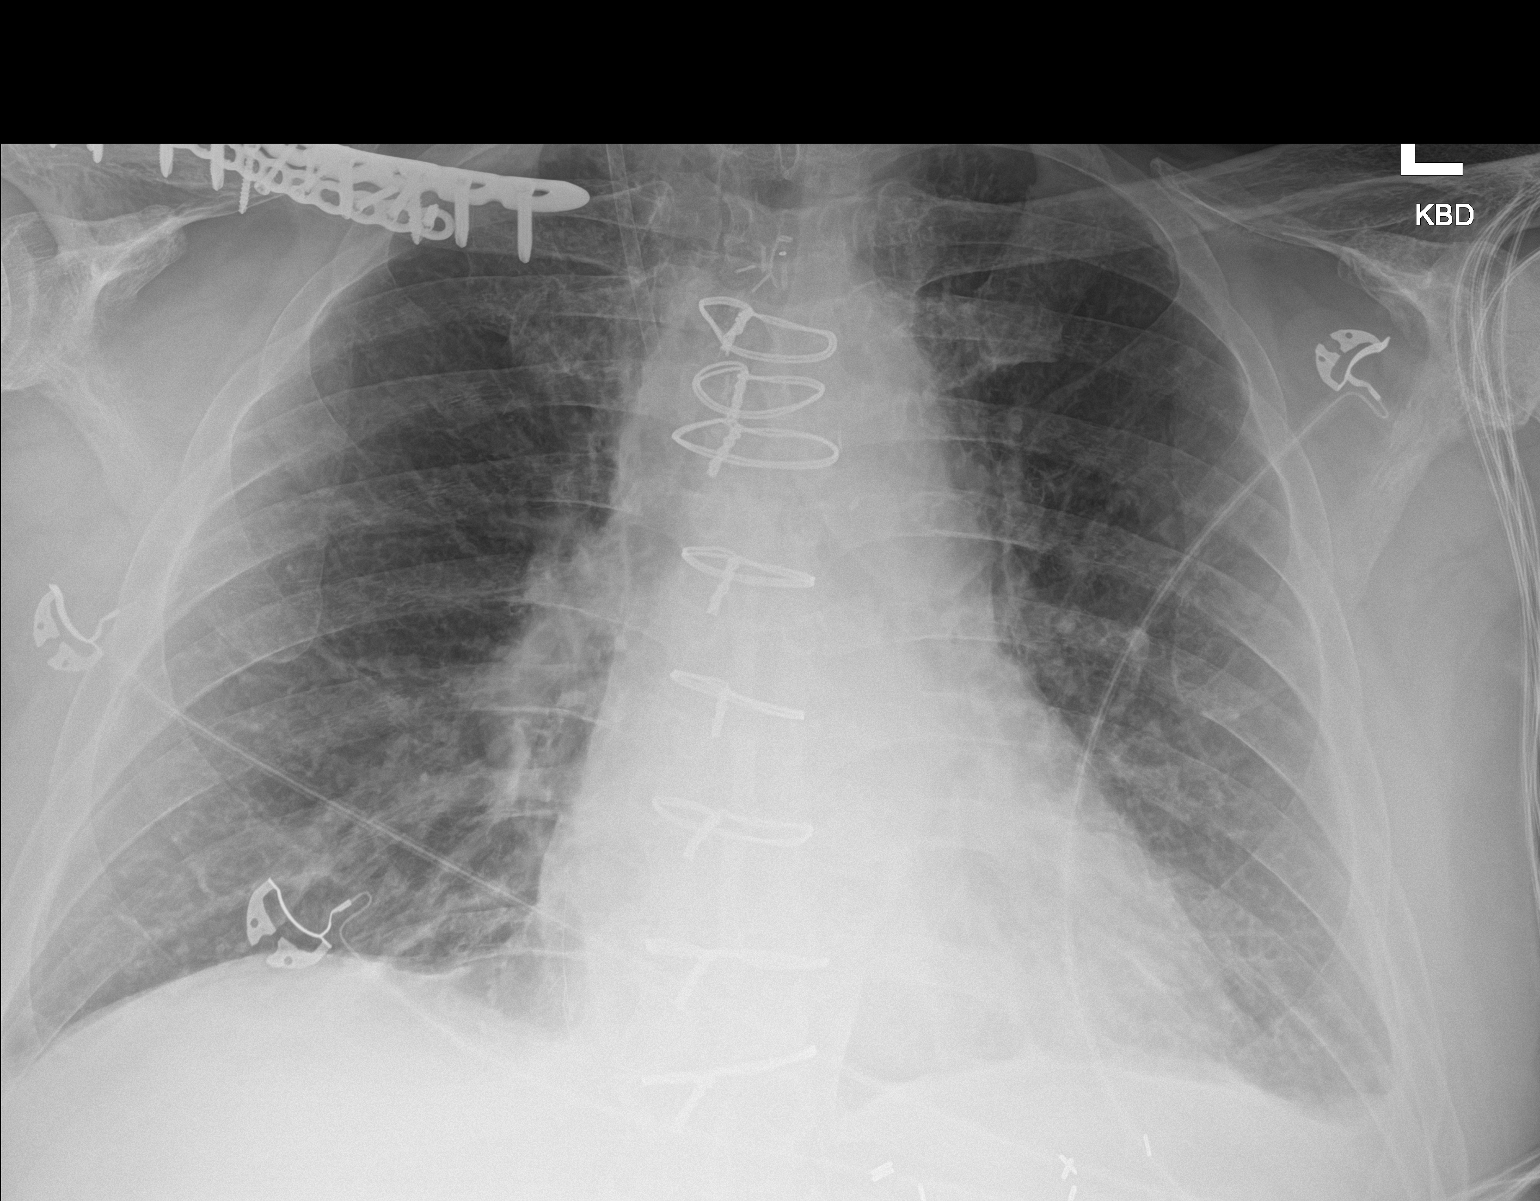

[1 of 1 positions shown; findings below may reference images not displayed]

FINDINGS: Previously noted Swan-Ganz catheter has been removed. Right IJ
Cordis remains in position with tip in the proximal superior vena
cava. Previously noted mediastinal drain has been removed. Bibasilar
areas of subsegmental atelectasis are noted. Small left pleural
effusion. No acute consolidative airspace disease. No evidence of
pulmonary edema. Heart size is normal. Upper mediastinal contours
are within normal limits. Status post median sternotomy. Status post
ORIF in the right clavicle.
IMPRESSION: 1. Postoperative changes and support apparatus, as above.
2. Dependent areas of subsegmental atelectasis in the lower lobes of
the lungs bilaterally with small left pleural effusion.

## 2021-01-08 IMAGING — CR DG CHEST 2V
1 series · 1 of 1 positions shown · non-contrast
Comparison: 07/31/2019

CLINICAL DATA: Pleural effusion.

EXAM:
CHEST - 2 VIEW

[chest pa]
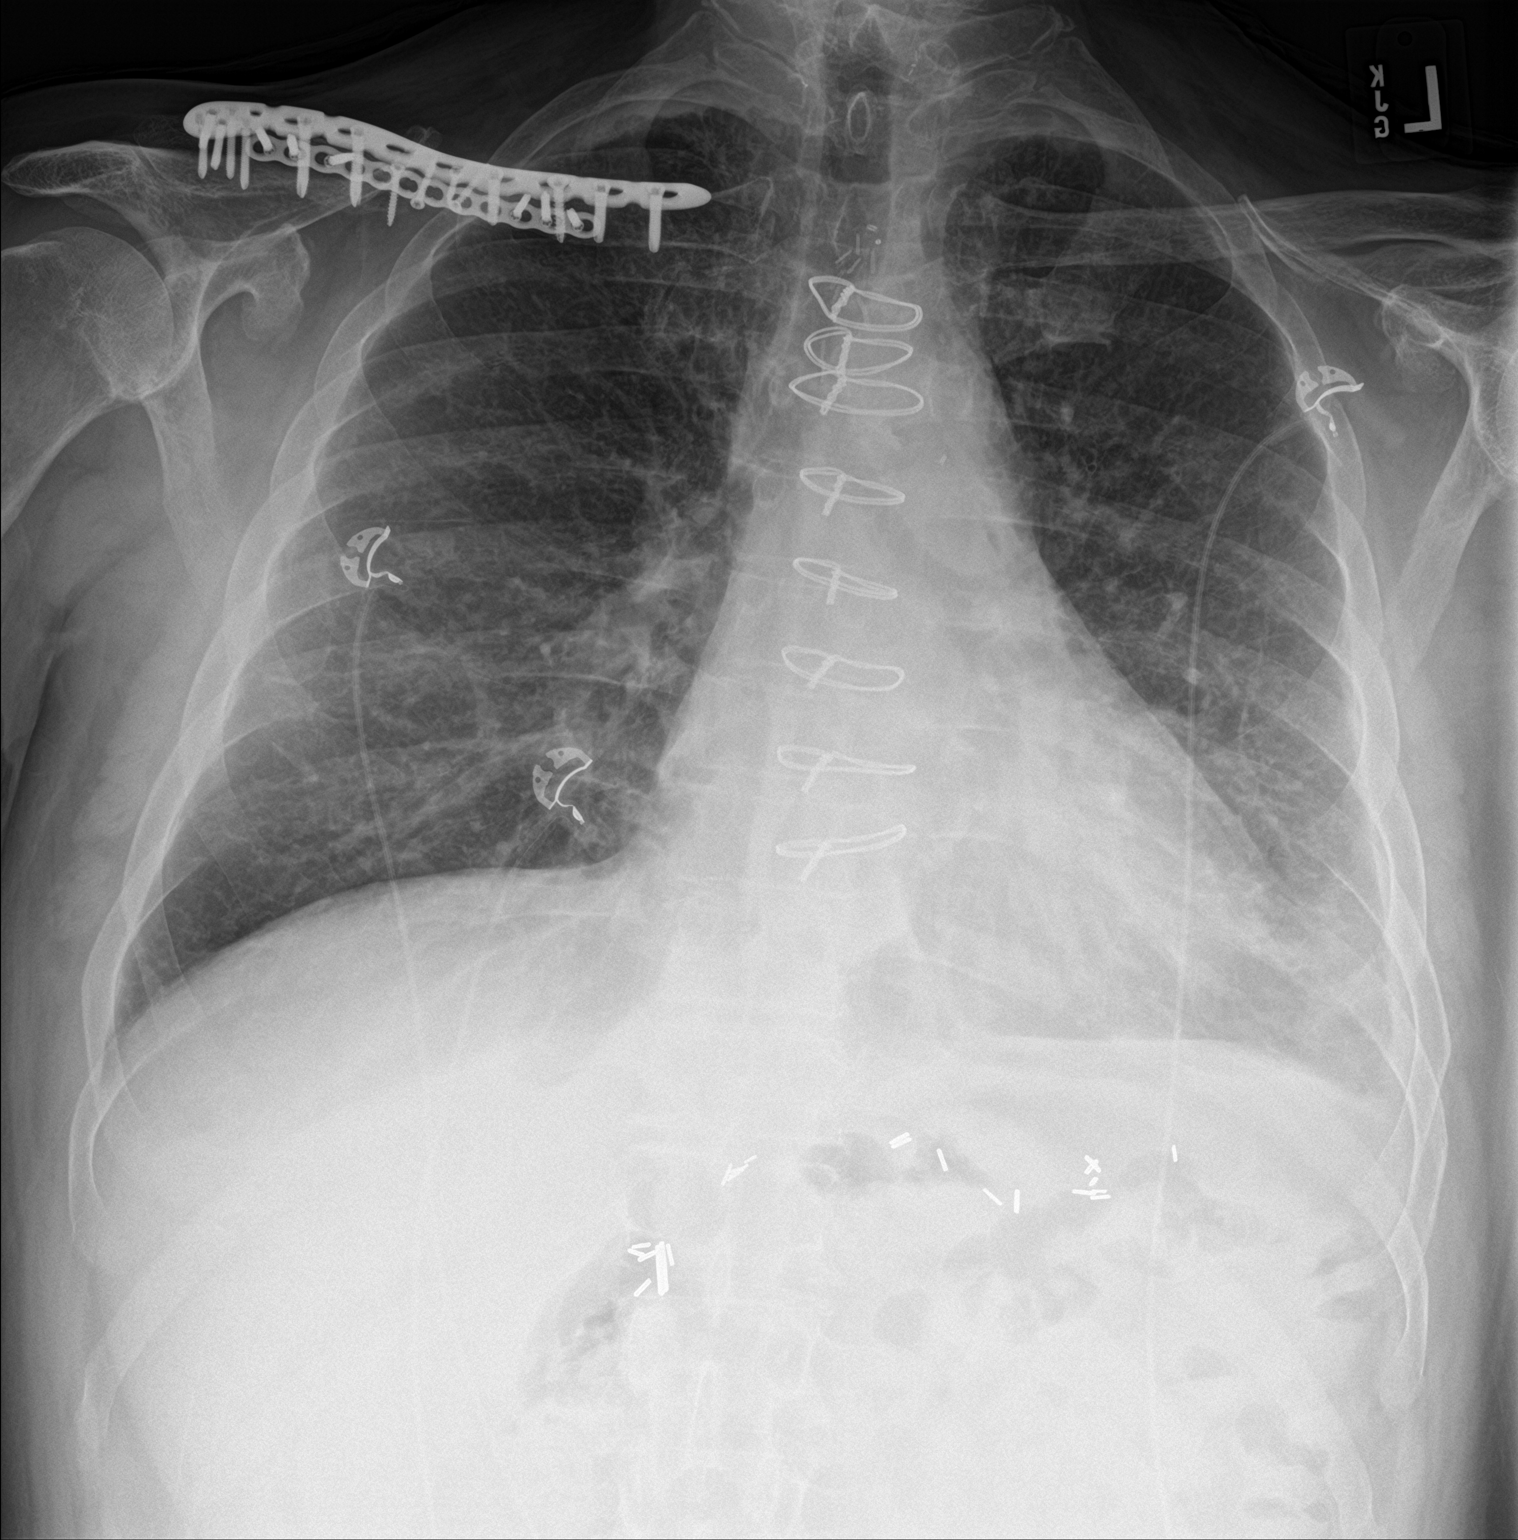

[1 of 1 positions shown; findings below may reference images not displayed]

FINDINGS: Previous median sternotomy and CABG procedure. Previous screw and
plate fixation involving the right clavicle. Small left pleural
effusion is unchanged from previous exam. Pulmonary vascular
congestion. Subsegmental atelectasis noted in the left base.
IMPRESSION: 1. Small left pleural effusion and pulmonary vascular congestion.
2. Subsegmental atelectasis within the left base.

## 2021-02-01 ENCOUNTER — Telehealth: Payer: Self-pay | Admitting: Hematology and Oncology

## 2021-02-01 NOTE — Telephone Encounter (Signed)
Received a new hem referral from Dr. Valora Piccolo at [facility] for anemia. [name] has been cld and scheduled to see Dr. Chryl Heck on 3/24 at 1040am. Pt aware to arrive 20 minutes early.

## 2021-02-07 ENCOUNTER — Telehealth: Payer: Self-pay | Admitting: Hematology and Oncology

## 2021-02-07 NOTE — Telephone Encounter (Signed)
Pt cld to reschedule his new hem appt to 4/12 at 11:20am. Letter maield.

## 2021-02-08 ENCOUNTER — Other Ambulatory Visit: Payer: Medicare Other

## 2021-02-08 ENCOUNTER — Encounter: Payer: Medicare Other | Admitting: Hematology and Oncology

## 2021-02-25 ENCOUNTER — Other Ambulatory Visit: Payer: Self-pay | Admitting: Cardiology

## 2021-02-25 DIAGNOSIS — R600 Localized edema: Secondary | ICD-10-CM

## 2021-02-27 ENCOUNTER — Inpatient Hospital Stay: Payer: Medicare Other

## 2021-02-27 ENCOUNTER — Other Ambulatory Visit: Payer: Self-pay

## 2021-02-27 ENCOUNTER — Inpatient Hospital Stay: Payer: Medicare Other | Attending: Hematology and Oncology | Admitting: Hematology and Oncology

## 2021-02-27 ENCOUNTER — Telehealth: Payer: Self-pay | Admitting: Hematology and Oncology

## 2021-02-27 VITALS — BP 113/53 | HR 60 | Temp 97.9°F | Resp 18 | Wt 195.0 lb

## 2021-02-27 DIAGNOSIS — Z9081 Acquired absence of spleen: Secondary | ICD-10-CM | POA: Insufficient documentation

## 2021-02-27 DIAGNOSIS — D649 Anemia, unspecified: Secondary | ICD-10-CM

## 2021-02-27 DIAGNOSIS — Z8572 Personal history of non-Hodgkin lymphomas: Secondary | ICD-10-CM | POA: Diagnosis not present

## 2021-02-27 DIAGNOSIS — R0602 Shortness of breath: Secondary | ICD-10-CM | POA: Diagnosis not present

## 2021-02-27 DIAGNOSIS — Z923 Personal history of irradiation: Secondary | ICD-10-CM | POA: Insufficient documentation

## 2021-02-27 DIAGNOSIS — R634 Abnormal weight loss: Secondary | ICD-10-CM | POA: Diagnosis not present

## 2021-02-27 DIAGNOSIS — R5383 Other fatigue: Secondary | ICD-10-CM | POA: Insufficient documentation

## 2021-02-27 DIAGNOSIS — B1920 Unspecified viral hepatitis C without hepatic coma: Secondary | ICD-10-CM | POA: Insufficient documentation

## 2021-02-27 DIAGNOSIS — F17210 Nicotine dependence, cigarettes, uncomplicated: Secondary | ICD-10-CM | POA: Insufficient documentation

## 2021-02-27 DIAGNOSIS — R590 Localized enlarged lymph nodes: Secondary | ICD-10-CM | POA: Insufficient documentation

## 2021-02-27 LAB — CMP (CANCER CENTER ONLY)
ALT: 19 U/L (ref 0–44)
AST: 29 U/L (ref 15–41)
Albumin: 3.8 g/dL (ref 3.5–5.0)
Alkaline Phosphatase: 179 U/L — ABNORMAL HIGH (ref 38–126)
Anion gap: 8 (ref 5–15)
BUN: 14 mg/dL (ref 6–20)
CO2: 30 mmol/L (ref 22–32)
Calcium: 9 mg/dL (ref 8.9–10.3)
Chloride: 102 mmol/L (ref 98–111)
Creatinine: 0.76 mg/dL (ref 0.61–1.24)
GFR, Estimated: >60 mL/min (ref >60)
Glucose, Bld: 100 mg/dL — ABNORMAL HIGH (ref 70–99)
Potassium: 4.9 mmol/L (ref 3.5–5.1)
Sodium: 140 mmol/L (ref 135–145)
Total Bilirubin: 0.3 mg/dL (ref 0.3–1.2)
Total Protein: 7.5 g/dL (ref 6.5–8.1)

## 2021-02-27 LAB — CBC WITH DIFFERENTIAL/PLATELET
Abs Immature Granulocytes: 0.02 10*3/uL (ref 0.00–0.07)
Basophils Absolute: 0.1 10*3/uL (ref 0.0–0.1)
Basophils Relative: 1 %
Eosinophils Absolute: 0.6 10*3/uL — ABNORMAL HIGH (ref 0.0–0.5)
Eosinophils Relative: 7 %
HCT: 38.4 % — ABNORMAL LOW (ref 39.0–52.0)
Hemoglobin: 12.3 g/dL — ABNORMAL LOW (ref 13.0–17.0)
Immature Granulocytes: 0 %
Lymphocytes Relative: 23 %
Lymphs Abs: 2.1 10*3/uL (ref 0.7–4.0)
MCH: 30.1 pg (ref 26.0–34.0)
MCHC: 32 g/dL (ref 30.0–36.0)
MCV: 93.9 fL (ref 80.0–100.0)
Monocytes Absolute: 1.1 10*3/uL — ABNORMAL HIGH (ref 0.1–1.0)
Monocytes Relative: 12 %
Neutro Abs: 5.1 10*3/uL (ref 1.7–7.7)
Neutrophils Relative %: 57 %
Platelets: 288 10*3/uL (ref 150–400)
RBC: 4.09 MIL/uL — ABNORMAL LOW (ref 4.22–5.81)
RDW: 15.9 % — ABNORMAL HIGH (ref 11.5–15.5)
WBC: 9 10*3/uL (ref 4.0–10.5)
nRBC: 0 % (ref 0.0–0.2)

## 2021-02-27 LAB — RETICULOCYTES
Immature Retic Fract: 7.6 % (ref 2.3–15.9)
RBC.: 4.1 MIL/uL — ABNORMAL LOW (ref 4.22–5.81)
Retic Count, Absolute: 41.4 10*3/uL (ref 19.0–186.0)
Retic Ct Pct: 1 % (ref 0.4–3.1)

## 2021-02-27 LAB — PHOSPHORUS: Phosphorus: 4.4 mg/dL (ref 2.5–4.6)

## 2021-02-27 LAB — URIC ACID: Uric Acid, Serum: 4.5 mg/dL (ref 3.7–8.6)

## 2021-02-27 LAB — LACTATE DEHYDROGENASE: LDH: 190 U/L (ref 98–192)

## 2021-02-27 NOTE — Telephone Encounter (Signed)
Scheduled follow-up appointment per 4/12 los. Patient is aware. 

## 2021-02-27 NOTE — Progress Notes (Signed)
[facility] CONSULT NOTE  Patient Care Team: Reed Breech, NP as PCP - General (Nurse Practitioner)  CHIEF COMPLAINTS/PURPOSE OF CONSULTATION:  Anemia.  ASSESSMENT & PLAN:  No problem-specific Assessment & Plan notes found for this encounter.  No orders of the defined types were placed in this encounter.  This is a very pleasant 55 year old male patient with past medical history significant for hypertension, dyslipidemia, Hodgkin's lymphoma status post radiation, no chemotherapy, COPD referred to hematology for evaluation of anemia. He was initially referred to hematology for evaluation of anemia however was also found to have some enlarged mediastinal lymphadenopathy hence also referred to oncology at [facility].  He reports unintentional weight loss in the past couple months along with feeling more tired and shortness of breath.  He is worried about the recurrence of lymphoma.  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 Physical examination today without any palpable lymphadenopathy or hepatosplenomegaly.  I reviewed his labs.  Normocytic normochromic anemia which has slowly progressed in the past year.  No evidence of nutritional deficiency.  No evidence of hemolysis.  No CMP findings suggestive of monoclonal protein.  I have recommended additional evaluation for anemia today.  Have suggested he choose to follow-up with one of the hematologist/oncologist so he can avoid 2 visits since we are both trying to treat anemia as well as lymphoma.  He will meet his oncologist in [place] next week and he will make that decision.  He understands that there is no need for transfusion at this time.  I have noticed that his anemia has actually worsened since his aortic valve replacement and some of this could be postop and if he indeed has lymphoma again, anemia could also be related to the lymphoma.  He  will return to clinic for telephone visit to review labs and to discuss additional recommendations.  He will keep [place] posted about the visit with his oncologist regarding his mediastinal lymphadenopathy. Thank you for consulting [place] in the care of this patient.  Please do not hesitate to contact us with any additional questions or concerns.    HISTORY OF PRESENTING ILLNESS:   Henry Sheppard 55 y.o. male is here because of anemia.  Mr. Dougal is a very pleasant 55 year old male patient with past medical history significant for hypertension, dyslipidemia, Hodgkin's lymphoma at the age of 42 status post radiation, no chemotherapy, COPD referred to hematology for evaluation of anemia.  Patient complains of unexplained weight loss in the past couple months, fatigue, no other B symptoms.  He also has noticed increased shortness of breath and most recently had a CT imaging of his chest which suggested presence of mediastinal lymphadenopathy concerning for lymphoma.  He apparently was also referred to heme-onc at [facility] and he has an appointment next week.  He was initially referred here for evaluation of anemia.  He denies any blood loss in his stool or his urine.  He is understandably worried about the lymphoma recurrence.  He has significant past medical history of coronary artery disease status post CABG, aortic valve replacement.  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 Besides unexplained weight loss, worsening shortness of breath he feels well mostly.  He has baseline COPD.  Rest of the pertinent review of systems reviewed and negative.  REVIEW OF SYSTEMS:   Constitutional: Denies fevers, chills or abnormal night sweats Eyes: Denies blurriness of vision,  double vision or watery eyes Ears, nose, mouth, throat, and face: Denies mucositis or sore throat Respiratory: As mentioned above  cardiovascular: Denies palpitation, chest discomfort or lower extremity swelling Gastrointestinal:   Denies nausea, heartburn or change in bowel habits Skin: Denies abnormal skin rashes Lymphatics: Denies new lymphadenopathy or easy bruising Neurological:Denies numbness, tingling or new weaknesses Behavioral/Psych: Mood is stable, no new changes  All other systems were reviewed with the patient and are negative.  MEDICAL HISTORY:  Past Medical History:  Diagnosis Date  . Anxiety   . Arthritis   . COPD (chronic obstructive pulmonary disease) ([place])   . Depression   . Dyspnea   . H/O colonoscopy 2018   NORMAL  . H/O echocardiogram 10/07/2018   MODERATE TO SEVERE  AORTIC VALVE STENOSIS..EF 40-50%  . H/O endoscopy 2018  . Heart murmur   . Hodgkins lymphoma ([place])    in remission  . Hyperlipidemia, group A   . Hypertension, benign   . Left bundle branch block   . Narcotic dependence, in remission ([place])   . Pneumonia     SURGICAL HISTORY: Past Surgical History:  Procedure Laterality Date  . AORTIC VALVE REPLACEMENT     21 mm pericardial tissue valve 2011 in [place]  . ASCENDING AORTIC ROOT REPLACEMENT N/A 07/29/2019   Procedure: ASCENDING AORTIC PORCINE ROOT REPLACEMENT USING MEDTRONIC FREESTYLE AORTIC ROOT SIZE 90mm and HEMOSHIELD STRAIGHT PLATINUM GRAFT SIZE 24;  Surgeon: [name], MD;  Location: [facility];  Service: Open Heart Surgery;  Laterality: N/A;  . CARDIAC CATHETERIZATION     12/22/18: 40% proximal RCA, normal LM, LAD, LCX  . CARDIAC VALVE REPLACEMENT     AVR pericardial tissue valve 2011 ([place])  . collarbone surgery Right   . CORONARY ARTERY BYPASS GRAFT    . lymph angiogram     when pt.  was 17 yrs. old  . RIGHT/LEFT HEART CATH AND CORONARY ANGIOGRAPHY N/A 12/22/2018   Procedure: RIGHT/LEFT HEART CATH AND CORONARY ANGIOGRAPHY;  Surgeon: Nigel Mormon, MD;  Location: [facility];  Service: Cardiovascular;  Laterality: N/A;  . SPLENECTOMY    . TEE WITHOUT CARDIOVERSION N/A 12/22/2018   Procedure: TRANSESOPHAGEAL ECHOCARDIOGRAM (TEE);  Surgeon:  Nigel Mormon, MD;  Location: [facility] ENDOSCOPY;  Service: Cardiovascular;  Laterality: N/A;  . TEE WITHOUT CARDIOVERSION N/A 07/29/2019   Procedure: TRANSESOPHAGEAL ECHOCARDIOGRAM (TEE);  Surgeon: [name], MD;  Location: [facility];  Service: Open Heart Surgery;  Laterality: N/A;  . TOTAL THYROIDECTOMY      SOCIAL HISTORY: Social History   Socioeconomic History  . Marital status: Married    Spouse name: Not on file  . Number of children: 3  . Years of education: Not on file  . Highest education level: Not on file  Occupational History  . Not on file  Tobacco Use  . Smoking status: Current Every Day Smoker    Packs/day: 2.00    Years: 34.00    Pack years: 68.00    Types: Cigarettes    Last attempt to quit: 03/16/2019    Years since quitting: 1.9  . Smokeless tobacco: Never Used  . Tobacco comment: started smoking 2 months ago doing about 1ppd as of 03/08/20  Vaping Use  . Vaping Use: Never used  Substance and Sexual Activity  . Alcohol use: Not Currently  . Drug use: Not on file    Comment: smokes 1 joint daily  . Sexual activity: Yes  Other Topics Concern  .  Not on file  Social History Narrative  . Not on file   Social Determinants of Health   Financial Resource Strain: Not on file  Food Insecurity: Not on file  Transportation Needs: Not on file  Physical Activity: Not on file  Stress: Not on file  Social Connections: Not on file  Intimate Partner Violence: Not on file    FAMILY HISTORY: Family History  Problem Relation Age of Onset  . Cancer Father   . Cancer Mother   . Asthma Brother   . Asthma Brother   . Lung disease Neg Hx     ALLERGIES:  has No Known Allergies.  MEDICATIONS:  Current Outpatient Medications  Medication Sig Dispense Refill  . albuterol (VENTOLIN HFA) 108 (90 Base) MCG/ACT inhaler Inhale 2 puffs into the lungs every 6 (six) hours as needed (wheezing/shortness of breath). 18 g 6  . aspirin EC 81 MG tablet Take 1 tablet (81 mg  total) by mouth daily. 90 tablet 3  . atorvastatin (LIPITOR) 40 MG tablet Take 40 mg by mouth daily.    . chlorthalidone (HYGROTON) 25 MG tablet     . diazepam (VALIUM) 5 MG tablet Take 1 tablet by mouth 2 (two) times daily as needed.    . fluticasone (FLOVENT HFA) 110 MCG/ACT inhaler Inhale 2 puffs into the lungs 2 (two) times daily.    . folic acid (FOLVITE) 825 MCG tablet Take by mouth.    . hydrOXYzine (ATARAX/VISTARIL) 25 MG tablet Take by mouth.    Marland Kitchen ibuprofen (ADVIL) 800 MG tablet Take 1 tablet by mouth daily as needed.    Marland Kitchen levothyroxine (SYNTHROID) 175 MCG tablet Take 175 mcg by mouth daily.    Marland Kitchen losartan (COZAAR) 25 MG tablet Take 25 mg by mouth at bedtime.     . meloxicam (MOBIC) 15 MG tablet Take 1 tablet by mouth daily as needed.    . metoprolol tartrate (LOPRESSOR) 50 MG tablet Take 50 mg by mouth 2 (two) times daily.    Marland Kitchen MOVANTIK 25 MG TABS tablet Take 25 mg by mouth daily.    . OXYGEN Inhale 2 L into the lungs daily. 3L 24/7    . pantoprazole (PROTONIX) 40 MG tablet Take 1 tablet by mouth 2 (two) times daily.    . potassium chloride (MICRO-K) 10 MEQ CR capsule Take 10 mEq by mouth 2 (two) times daily.    . sertraline (ZOLOFT) 100 MG tablet Take 100 mg by mouth daily.    . SUBOXONE 8-2 MG FILM Place 1 Film under the tongue 2 (two) times daily.   0  . torsemide (DEMADEX) 20 MG tablet TAKE 2 TABLETS BY MOUTH EVERY DAY 180 tablet 0  . atorvastatin (LIPITOR) 40 MG tablet Take 40 mg by mouth at bedtime.     . Budeson-Glycopyrrol-Formoterol (BREZTRI AEROSPHERE) 160-9-4.8 MCG/ACT AERO Inhale 2 puffs into the lungs in the morning and at bedtime. (Patient not taking: Reported on 02/27/2021) 5.9 g 0  . levothyroxine (SYNTHROID, LEVOTHROID) 200 MCG tablet Take 200 mcg by mouth daily.     . sertraline (ZOLOFT) 100 MG tablet Take 100 mg by mouth at bedtime.      No current facility-administered medications for this visit.     PHYSICAL EXAMINATION: ECOG PERFORMANCE STATUS: 1 -  Symptomatic but completely ambulatory  Vitals:   02/27/21 1127  BP: (!) 113/53  Pulse: 60  Resp: 18  Temp: 97.9 F (36.6 C)  SpO2: 94%   Filed Weights   02/27/21  1127  Weight: 195 lb (88.5 kg)    GENERAL:alert, no distress and comfortable SKIN: skin color, texture, turgor are normal, no rashes or significant lesions EYES: normal, conjunctiva are pink and non-injected, sclera clear OROPHARYNX:no exudate, no erythema and lips, buccal mucosa, and tongue normal  NECK: supple, thyroid normal size, non-tender, without nodularity LYMPH:  no palpable lymphadenopathy in the cervical, axillary or inguinal LUNGS: clear to auscultation and percussion with normal breathing effort HEART: regular rate & rhythm and no murmurs and no lower extremity edema ABDOMEN:abdomen soft, non-tender and normal bowel sounds Musculoskeletal:no cyanosis of digits and no clubbing  PSYCH: alert & oriented x 3 with fluent speech NEURO: no focal motor/sensory deficits  LABORATORY DATA:  I have reviewed the data as listed Lab Results  Component Value Date   WBC 14.5 (H) 08/02/2019   HGB 11.2 (L) 08/02/2019   HCT 35.0 (L) 08/02/2019   MCV 97.0 08/02/2019   PLT 227 08/02/2019     Chemistry      Component Value Date/Time   NA 137 07/31/2019 0503   K 3.9 07/31/2019 0503   CL 101 07/31/2019 0503   CO2 29 07/31/2019 0503   BUN 6 07/31/2019 0503   CREATININE 0.58 (L) 07/31/2019 0503      Component Value Date/Time   CALCIUM 8.6 (L) 07/31/2019 0503   ALKPHOS 99 07/27/2019 1220   AST 19 07/27/2019 1220   ALT 15 07/27/2019 1220   BILITOT 0.7 07/27/2019 1220     CT chest wo contrast  IMPRESSION:  1. There are multiple enlarged mediastinal lymph nodes. These could be related to patient's known lymphoma.  2. There are diffuse scattered groundglass attenuation nodules or nodular infiltrates throughout both lungs, mild. This is probably due to infectious or inflammatory etiology. Follow-up CT in 3 months is  recommended to exclude groundglass attenuation nodules.  3. Emphysema. Mild fibrotic interstitial lung disease.  4. Subacute appearing anterior bilateral rib fractures. Again seen are age indeterminate, probably subacute, compression fractures of T2 and T6 vertebral bodies. If there is concern for pathologic fractures, and then scan could be helpful.   I have reviewed his labs as well. His anemia is normocytic normochromic, Hb 12.3, declined over time. Creatinine is normal, TP normal, calcium normal.  Ferritin 146 B12 is normal. Hep C and HIV NR  Electronically Signed by: Osa Craver on 02/13/2021 3:51 PM  RADIOGRAPHIC STUDIES: I have personally reviewed the radiological images as listed and agreed with the findings in the report. No results found.  All questions were answered. The patient knows to call the clinic with any problems, questions or concerns. I spent 45 minutes in the care of this patient including H and P, review of records, counseling and coordination of care.     Benay Pike, MD 02/27/2021 11:40 AM

## 2021-02-28 ENCOUNTER — Encounter: Payer: Self-pay | Admitting: Hematology and Oncology

## 2021-03-01 LAB — PROTEIN ELECTROPHORESIS, SERUM, WITH REFLEX
A/G Ratio: 1.1 (ref 0.7–1.7)
Albumin ELP: 3.8 g/dL (ref 2.9–4.4)
Alpha-1-Globulin: 0.3 g/dL (ref 0.0–0.4)
Alpha-2-Globulin: 0.7 g/dL (ref 0.4–1.0)
Beta Globulin: 1 g/dL (ref 0.7–1.3)
Gamma Globulin: 1.5 g/dL (ref 0.4–1.8)
Globulin, Total: 3.4 g/dL (ref 2.2–3.9)
Total Protein ELP: 7.2 g/dL (ref 6.0–8.5)

## 2021-03-14 ENCOUNTER — Inpatient Hospital Stay (HOSPITAL_BASED_OUTPATIENT_CLINIC_OR_DEPARTMENT_OTHER): Payer: Medicare Other | Admitting: Hematology and Oncology

## 2021-03-14 ENCOUNTER — Encounter: Payer: Self-pay | Admitting: Hematology and Oncology

## 2021-03-14 DIAGNOSIS — R599 Enlarged lymph nodes, unspecified: Secondary | ICD-10-CM

## 2021-03-14 DIAGNOSIS — D649 Anemia, unspecified: Secondary | ICD-10-CM | POA: Diagnosis not present

## 2021-03-14 DIAGNOSIS — Z8571 Personal history of Hodgkin lymphoma: Secondary | ICD-10-CM

## 2021-03-14 DIAGNOSIS — Z923 Personal history of irradiation: Secondary | ICD-10-CM

## 2021-03-14 DIAGNOSIS — R918 Other nonspecific abnormal finding of lung field: Secondary | ICD-10-CM

## 2021-03-14 DIAGNOSIS — I10 Essential (primary) hypertension: Secondary | ICD-10-CM | POA: Diagnosis not present

## 2021-03-14 DIAGNOSIS — F17210 Nicotine dependence, cigarettes, uncomplicated: Secondary | ICD-10-CM | POA: Diagnosis not present

## 2021-03-14 NOTE — Assessment & Plan Note (Signed)
  This is a very pleasant 55 year old male patient with past medical history significant for hypertension, dyslipidemia, Hodgkin's lymphoma status post radiation, no chemotherapy, COPD referred to hematology for evaluation of anemia. His anemia appears to be stable for at least a yr. No evidence of nutritional deficiency, hemolysis, monoclonal protein, hypothyroidism or kidney dysfunction. He has had stable hemoglobin since his aortic valve replacement, wonder if there is low degree of hemolysis going on. I agree with PET scan for further evaluation of his mediastinal lymphadenopathy. He will think and let us know if he would like to continue FU at [facility] vs here for hematology/oncology needs. I asked him to contact us once he decides, made sure he has our contact number.

## 2021-03-14 NOTE — Progress Notes (Signed)
Henry Sheppard NOTE  Patient Care Team: Reed Breech, NP as PCP - General (Nurse Practitioner)  CHIEF COMPLAINTS/PURPOSE OF CONSULTATION:  Follow up to review labs for normocytic normochromic anemia.  ASSESSMENT & PLAN:   Normocytic normochromic anemia  This is a very pleasant 55 year old male patient with past medical history significant for hypertension, dyslipidemia, Hodgkin's lymphoma status post radiation, no chemotherapy, COPD referred to hematology for evaluation of anemia. His anemia appears to be stable for at least a yr. No evidence of nutritional deficiency, hemolysis, monoclonal protein, hypothyroidism or kidney dysfunction. He has had stable hemoglobin since his aortic valve replacement, wonder if there is low degree of hemolysis going on. I agree with PET scan for further evaluation of his mediastinal lymphadenopathy. He will think and let us know if he would like to continue FU at [facility] vs here for hematology/oncology needs. I asked him to contact us once he decides, made sure he has our contact number.  No orders of the defined types were placed in this encounter.    HISTORY OF PRESENTING ILLNESS:   Henry Sheppard 55 y.o. male is here because of anemia.   Henry Sheppard is a very pleasant 55 year old male patient with past medical history significant for hypertension, dyslipidemia, Hodgkin's lymphoma at the age of 11 status post radiation, no chemotherapy, COPD referred to hematology for evaluation of anemia.  Patient complains of unexplained weight loss in the past couple months, fatigue, no other B symptoms.  He also has noticed increased shortness of breath and most recently had a CT imaging of his chest which suggested presence of mediastinal lymphadenopathy concerning for lymphoma.   He has significant past medical history of coronary artery disease status post CABG, aortic valve replacement.  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  Interval History  Henry Sheppard is here for a telephone visit. He is doing well today He saw the hematologist at [facility], and a PET scan has been ordered. Since his visit last time, no significant worsening of shortness of breath. He has his PET scheduled for first week of May. No change in symptoms, feels tired.  REVIEW OF SYSTEMS:   Constitutional: Denies fevers, chills or abnormal night sweats Eyes: Denies blurriness of vision, double vision or watery eyes Ears, nose, mouth, throat, and face: Denies mucositis or sore throat Respiratory: As mentioned above  cardiovascular: Denies palpitation, chest discomfort or lower extremity swelling Gastrointestinal:  Denies nausea, heartburn or change in bowel habits Skin: Denies abnormal skin rashes Lymphatics: Denies new lymphadenopathy or easy bruising Neurological:Denies numbness, tingling or new weaknesses Behavioral/Psych: Mood is stable, no new changes  All other systems were reviewed with the patient and are negative.  MEDICAL HISTORY:  Past Medical History:  Diagnosis Date  . Anxiety   . Arthritis   . COPD (chronic obstructive pulmonary disease) ([place])   . Depression   . Dyspnea   . H/O colonoscopy 2018   NORMAL  . H/O echocardiogram 10/07/2018   MODERATE TO SEVERE  AORTIC VALVE STENOSIS..EF 40-50%  . H/O endoscopy 2018  . Heart murmur   . Hodgkins lymphoma ([place] [place])    in remission  . Hyperlipidemia, group A   . Hypertension, benign   . Left bundle branch block   . Narcotic dependence, in remission ([place])   . Pneumonia     SURGICAL HISTORY: Past Surgical History:  Procedure Laterality Date  . AORTIC VALVE REPLACEMENT     21 mm pericardial tissue valve 2011  [place]  . ASCENDING AORTIC ROOT REPLACEMENT N/A 07/29/2019   Procedure: ASCENDING AORTIC PORCINE ROOT REPLACEMENT USING MEDTRONIC FREESTYLE AORTIC ROOT SIZE 5mm and HEMOSHIELD STRAIGHT PLATINUM GRAFT SIZE 24;  Surgeon: [name], MD;  Location: [place];  Service: Open Heart Surgery;  Laterality: N/A;  . CARDIAC CATHETERIZATION     12/22/18: 40% proximal RCA, normal LM, LAD, LCX  . CARDIAC VALVE REPLACEMENT     AVR pericardial tissue valve 2011 ([place])  . collarbone surgery Right   . CORONARY ARTERY BYPASS GRAFT    . lymph angiogram     when pt.  was 17 yrs. old  . RIGHT/LEFT HEART CATH AND CORONARY ANGIOGRAPHY N/A 12/22/2018   Procedure: RIGHT/LEFT HEART CATH AND CORONARY ANGIOGRAPHY;  Surgeon: Nigel Mormon, MD;  Location: [facility] CV LAB;  Service: Cardiovascular;  Laterality: N/A;  . SPLENECTOMY    . TEE WITHOUT CARDIOVERSION N/A 12/22/2018   Procedure: TRANSESOPHAGEAL ECHOCARDIOGRAM (TEE);  Surgeon: Nigel Mormon, MD;  Location: [facility] ENDOSCOPY;  Service: Cardiovascular;  Laterality: N/A;  . TEE WITHOUT CARDIOVERSION N/A 07/29/2019   Procedure: TRANSESOPHAGEAL ECHOCARDIOGRAM (TEE);  Surgeon: [name], MD;  Location: [place];  Service: Open Heart Surgery;  Laterality: N/A;  . TOTAL THYROIDECTOMY      SOCIAL HISTORY: Social History   Socioeconomic History  . Marital status: Married    Spouse name: Not on file  . Number of children: 3  . Years of education: Not on file  . Highest education level: Not on file  Occupational History  . Not on file  Tobacco Use  . Smoking status: Current Every Day Smoker    Packs/day: 2.00    Years: 34.00    Pack years: 68.00    Types: Cigarettes    Last attempt to quit: 03/16/2019    Years since quitting: 1.9  . Smokeless tobacco: Never Used  . Tobacco comment: started smoking 2 months ago doing about 1ppd as of 03/08/20  Vaping Use  . Vaping Use: Never used  Substance and Sexual Activity  . Alcohol use: Not Currently  . Drug use: Not on file    Comment: smokes 1 joint daily  . Sexual activity: Yes  Other Topics Concern  . Not on file  Social History Narrative  . Not on file   Social Determinants of Health   Financial Resource Strain: Not on file  Food Insecurity: Not  on file  Transportation Needs: Not on file  Physical Activity: Not on file  Stress: Not on file  Social Connections: Not on file  Intimate Partner Violence: Not on file    FAMILY HISTORY: Family History  Problem Relation Age of Onset  . Cancer Father   . Cancer Mother   . Asthma Brother   . Asthma Brother   . Lung disease Neg Hx     ALLERGIES:  has No Known Allergies.  MEDICATIONS:  Current Outpatient Medications  Medication Sig Dispense Refill  . albuterol (VENTOLIN HFA) 108 (90 Base) MCG/ACT inhaler Inhale 2 puffs into the lungs every 6 (six) hours as needed (wheezing/shortness of breath). 18 g 6  . aspirin EC 81 MG tablet Take 1 tablet (81 mg total) by mouth daily. 90 tablet 3  . atorvastatin (LIPITOR) 40 MG tablet Take 40 mg by mouth at bedtime.     Marland Kitchen atorvastatin (LIPITOR) 40 MG tablet Take 40 mg by mouth daily.    . Budeson-Glycopyrrol-Formoterol (BREZTRI AEROSPHERE) 160-9-4.8 MCG/ACT AERO Inhale 2  puffs into the lungs in the morning and at bedtime. (Patient not taking: Reported on 02/27/2021) 5.9 g 0  . chlorthalidone (HYGROTON) 25 MG tablet     . diazepam (VALIUM) 5 MG tablet Take 1 tablet by mouth 2 (two) times daily as needed.    . fluticasone (FLOVENT HFA) 110 MCG/ACT inhaler Inhale 2 puffs into the lungs 2 (two) times daily.    . folic acid (FOLVITE) A999333 MCG tablet Take by mouth.    . hydrOXYzine (ATARAX/VISTARIL) 25 MG tablet Take by mouth.    Marland Kitchen ibuprofen (ADVIL) 800 MG tablet Take 1 tablet by mouth daily as needed.    Marland Kitchen levothyroxine (SYNTHROID) 175 MCG tablet Take 175 mcg by mouth daily.    Marland Kitchen levothyroxine (SYNTHROID, LEVOTHROID) 200 MCG tablet Take 200 mcg by mouth daily.     Marland Kitchen losartan (COZAAR) 25 MG tablet Take 25 mg by mouth at bedtime.     . meloxicam (MOBIC) 15 MG tablet Take 1 tablet by mouth daily as needed.    . metoprolol tartrate (LOPRESSOR) 50 MG tablet Take 50 mg by mouth 2 (two) times daily.    Marland Kitchen MOVANTIK 25 MG TABS tablet Take 25 mg by mouth daily.     . OXYGEN Inhale 2 L into the lungs daily. 3L 24/7    . pantoprazole (PROTONIX) 40 MG tablet Take 1 tablet by mouth 2 (two) times daily.    . potassium chloride (MICRO-K) 10 MEQ CR capsule Take 10 mEq by mouth 2 (two) times daily.    . sertraline (ZOLOFT) 100 MG tablet Take 100 mg by mouth at bedtime.     . sertraline (ZOLOFT) 100 MG tablet Take 100 mg by mouth daily.    . SUBOXONE 8-2 MG FILM Place 1 Film under the tongue 2 (two) times daily.   0  . torsemide (DEMADEX) 20 MG tablet TAKE 2 TABLETS BY MOUTH EVERY DAY 180 tablet 0   No current facility-administered medications for this visit.     PHYSICAL EXAMINATION: ECOG PERFORMANCE STATUS: 1 - Symptomatic but completely ambulatory  VS and PE not done, telephone visit.  LABORATORY DATA:  I have reviewed the data as listed Lab Results  Component Value Date   WBC 9.0 02/27/2021   HGB 12.3 (L) 02/27/2021   HCT 38.4 (L) 02/27/2021   MCV 93.9 02/27/2021   PLT 288 02/27/2021     Chemistry      Component Value Date/Time   NA 140 02/27/2021 1216   K 4.9 02/27/2021 1216   CL 102 02/27/2021 1216   CO2 30 02/27/2021 1216   BUN 14 02/27/2021 1216   CREATININE 0.76 02/27/2021 1216      Component Value Date/Time   CALCIUM 9.0 02/27/2021 1216   ALKPHOS 179 (H) 02/27/2021 1216   AST 29 02/27/2021 1216   ALT 19 02/27/2021 1216   BILITOT 0.3 02/27/2021 1216     CT chest wo contrast  IMPRESSION:  1. There are multiple enlarged mediastinal lymph nodes. These could be related to patient's known lymphoma.  2. There are diffuse scattered groundglass attenuation nodules or nodular infiltrates throughout both lungs, mild. This is probably due to infectious or inflammatory etiology. Follow-up CT in 3 months is recommended to exclude groundglass attenuation nodules.  3. Emphysema. Mild fibrotic interstitial lung disease.  4. Subacute appearing anterior bilateral rib fractures. Again seen are age indeterminate, probably subacute, compression  fractures of T2 and T6 vertebral bodies. If there is concern for pathologic fractures, and  then scan could be helpful.   I have reviewed his labs as well. His anemia is normocytic normochromic, Hb 12.3, stable in the past 1 yr. Creatinine is normal, TP normal, calcium normal.  Ferritin 146 B12 is normal. Folate normal. Hep C and HIV NR No evidence of hemolysis SPEP normal.  Electronically Signed by: Osa Craver on 02/13/2021 3:51 PM  We reviewed labs today. Anemia has essentially been stable in the past 1 yr.   RADIOGRAPHIC STUDIES: I have personally reviewed the radiological images as listed and agreed with the findings in the report. No results found.  All questions were answered. The patient knows to call the clinic with any problems, questions or concerns. I spent 15 minutes in the care of this patient including, review of records, counseling and coordination of care.  I connected with  Lillia Pauls on 03/14/21 by a video enabled telephone application and verified that I am speaking with the correct person using two identifiers.   I discussed the limitations of evaluation and management by telemedicine. The patient expressed understanding and agreed to proceed.      Benay Pike, MD 03/14/2021 1:20 PM

## 2021-04-23 DIAGNOSIS — K219 Gastro-esophageal reflux disease without esophagitis: Secondary | ICD-10-CM | POA: Diagnosis present

## 2021-04-23 DIAGNOSIS — F419 Anxiety disorder, unspecified: Secondary | ICD-10-CM | POA: Insufficient documentation

## 2021-05-24 ENCOUNTER — Other Ambulatory Visit: Payer: Medicare HMO

## 2021-05-28 ENCOUNTER — Other Ambulatory Visit: Payer: Medicare Other

## 2021-05-29 ENCOUNTER — Other Ambulatory Visit: Payer: Medicare Other

## 2021-06-01 ENCOUNTER — Ambulatory Visit: Payer: Medicare HMO | Admitting: Cardiology

## 2021-07-09 ENCOUNTER — Emergency Department (HOSPITAL_COMMUNITY): Payer: Medicare Other

## 2021-07-09 ENCOUNTER — Encounter (HOSPITAL_COMMUNITY): Payer: Self-pay

## 2021-07-09 ENCOUNTER — Other Ambulatory Visit: Payer: Self-pay

## 2021-07-09 ENCOUNTER — Emergency Department (HOSPITAL_COMMUNITY)
Admission: EM | Admit: 2021-07-09 | Discharge: 2021-07-09 | Disposition: A | Discharge status: Home / Self Care | Payer: Medicare Other | Attending: Emergency Medicine | Admitting: Emergency Medicine

## 2021-07-09 DIAGNOSIS — R1310 Dysphagia, unspecified: Secondary | ICD-10-CM | POA: Diagnosis not present

## 2021-07-09 DIAGNOSIS — I2510 Atherosclerotic heart disease of native coronary artery without angina pectoris: Secondary | ICD-10-CM | POA: Diagnosis not present

## 2021-07-09 DIAGNOSIS — R6884 Jaw pain: Secondary | ICD-10-CM | POA: Insufficient documentation

## 2021-07-09 DIAGNOSIS — I5020 Unspecified systolic (congestive) heart failure: Secondary | ICD-10-CM | POA: Insufficient documentation

## 2021-07-09 DIAGNOSIS — M542 Cervicalgia: Secondary | ICD-10-CM | POA: Insufficient documentation

## 2021-07-09 DIAGNOSIS — Z7982 Long term (current) use of aspirin: Secondary | ICD-10-CM | POA: Diagnosis not present

## 2021-07-09 DIAGNOSIS — Z951 Presence of aortocoronary bypass graft: Secondary | ICD-10-CM | POA: Insufficient documentation

## 2021-07-09 DIAGNOSIS — H538 Other visual disturbances: Secondary | ICD-10-CM | POA: Diagnosis not present

## 2021-07-09 DIAGNOSIS — F17210 Nicotine dependence, cigarettes, uncomplicated: Secondary | ICD-10-CM | POA: Diagnosis not present

## 2021-07-09 DIAGNOSIS — R519 Headache, unspecified: Secondary | ICD-10-CM | POA: Insufficient documentation

## 2021-07-09 DIAGNOSIS — M7918 Myalgia, other site: Secondary | ICD-10-CM

## 2021-07-09 DIAGNOSIS — I110 Hypertensive heart disease with heart failure: Secondary | ICD-10-CM | POA: Diagnosis not present

## 2021-07-09 DIAGNOSIS — Z79899 Other long term (current) drug therapy: Secondary | ICD-10-CM | POA: Diagnosis not present

## 2021-07-09 DIAGNOSIS — J449 Chronic obstructive pulmonary disease, unspecified: Secondary | ICD-10-CM | POA: Diagnosis not present

## 2021-07-09 DIAGNOSIS — Z8571 Personal history of Hodgkin lymphoma: Secondary | ICD-10-CM | POA: Diagnosis not present

## 2021-07-09 DIAGNOSIS — H9209 Otalgia, unspecified ear: Secondary | ICD-10-CM | POA: Insufficient documentation

## 2021-07-09 DIAGNOSIS — R531 Weakness: Secondary | ICD-10-CM | POA: Diagnosis not present

## 2021-07-09 LAB — CBC
HCT: 42.2 % (ref 39.0–52.0)
Hemoglobin: 13.7 g/dL (ref 13.0–17.0)
MCH: 30.9 pg (ref 26.0–34.0)
MCHC: 32.5 g/dL (ref 30.0–36.0)
MCV: 95 fL (ref 80.0–100.0)
Platelets: 200 10*3/uL (ref 150–400)
RBC: 4.44 MIL/uL (ref 4.22–5.81)
RDW: 16.8 % — ABNORMAL HIGH (ref 11.5–15.5)
WBC: 13.6 10*3/uL — ABNORMAL HIGH (ref 4.0–10.5)
nRBC: 0 % (ref 0.0–0.2)

## 2021-07-09 LAB — BASIC METABOLIC PANEL
Anion gap: 7 (ref 5–15)
BUN: 22 mg/dL — ABNORMAL HIGH (ref 6–20)
CO2: 31 mmol/L (ref 22–32)
Calcium: 9.4 mg/dL (ref 8.9–10.3)
Chloride: 102 mmol/L (ref 98–111)
Creatinine, Ser: 0.59 mg/dL — ABNORMAL LOW (ref 0.61–1.24)
GFR, Estimated: >60 mL/min (ref >60)
Glucose, Bld: 132 mg/dL — ABNORMAL HIGH (ref 70–99)
Potassium: 4.7 mmol/L (ref 3.5–5.1)
Sodium: 140 mmol/L (ref 135–145)

## 2021-07-09 MED ORDER — IOHEXOL 350 MG/ML SOLN
75.0000 mL | Freq: Once | INTRAVENOUS | Status: AC | PRN
  Administered 2021-07-09: 75 mL via INTRAVENOUS

## 2021-07-09 NOTE — ED Provider Notes (Signed)
[facility] DEPT Provider Note   CSN: [id number] Arrival date & time: 07/09/21  [id number]     History Chief Complaint  Patient presents with   Neck Pain    Henry Sheppard is a 55 y.o. male.   Neck Pain Associated symptoms: headaches and weakness      Patient complains of neck pain that has been worsening over the last several days. He has neck pain at baseline after an accident several years ago in which he broke his shoulder and had surgery and has associated weakness as well. He had been working with PT to try and improve his pain when he felt a "pop" that he attributes to a nerve or a muscle. This occurred when he was hanging his head over the edge of the bed. Since then he has had what he describes as increased pain, weakness, blurry vision, difficulty swallowing, headaches, and ear pain. He has noticed a "knot" in the area of his neck where he felt the pop. Denies any facial droop or trouble speaking.   Past Medical History:  Diagnosis Date   Anxiety    Arthritis    COPD (chronic obstructive pulmonary disease) ([place])    Depression    Dyspnea    H/O colonoscopy 2018   NORMAL   H/O echocardiogram 10/07/2018   MODERATE TO SEVERE  AORTIC VALVE STENOSIS..EF 40-50%   H/O endoscopy 2018   Heart murmur    Hodgkins lymphoma ([place])    in remission   Hyperlipidemia, group A    Hypertension, benign    Left bundle branch block    Narcotic dependence, in remission [facility])    Pneumonia     Patient Active Problem List   Diagnosis Date Noted   Normocytic normochromic anemia 03/14/2021   Leg swelling 01/13/2020   Encounter for removal of sutures 08/13/2019   S/P aortic valve replacement and aortoplasty 07/29/2019   Coronary artery disease involving native coronary artery of native heart without angina pectoris 01/13/2019   Leg edema 01/13/2019   Leg cramps 01/13/2019   Narcotic dependence, in remission ([place])    Hyperlipidemia, group A     Hypertension, benign    Hodgkins lymphoma ([place])    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   S/P AVR 12/21/2018   Exertional dyspnea 12/21/2018   Opioid dependence ([place]) 12/21/2018   Essential hypertension 12/21/2018   H/O echocardiogram 10/07/2018   Other secondary pulmonary hypertension ([place]) 07/30/2018   HFrEF (heart failure with reduced ejection fraction) ([place]) 07/30/2018   High cholesterol 07/30/2018   H/O endoscopy 11/18/2016   H/O colonoscopy 11/18/2016    Past Surgical History:  Procedure Laterality Date   AORTIC VALVE REPLACEMENT     21 mm pericardial tissue valve 2011 in [place]   ASCENDING AORTIC ROOT REPLACEMENT N/A 07/29/2019   Procedure: ASCENDING AORTIC PORCINE ROOT REPLACEMENT USING MEDTRONIC FREESTYLE AORTIC ROOT SIZE 42m and HEMOSHIELD STRAIGHT PLATINUM GRAFT SIZE 24;  Surgeon: BGaye Pollack MD;  [place]  Service: Open Heart Surgery;  Laterality: N/A;   CARDIAC CATHETERIZATION     12/22/18: 40% proximal RCA, normal LM, LAD, LCX   CARDIAC VALVE REPLACEMENT     AVR pericardial tissue valve 2011 ([place])   collarbone surgery Right    CORONARY ARTERY BYPASS GRAFT     lymph angiogram     when pt.  was 17 yrs. old   RIGHT/LEFT HEART CATH AND CORONARY ANGIOGRAPHY N/A 12/22/2018   Procedure: RIGHT/LEFT HEART CATH AND  CORONARY ANGIOGRAPHY;  Surgeon: Nigel Mormon, MD;  Location: [facility] CV LAB;  Service: Cardiovascular;  Laterality: N/A;   SPLENECTOMY     TEE WITHOUT CARDIOVERSION N/A 12/22/2018   Procedure: TRANSESOPHAGEAL ECHOCARDIOGRAM (TEE);  Surgeon: Nigel Mormon, MD;  Location[facility] ENDOSCOPY;  Service: Cardiovascular;  Laterality: N/A;   TEE WITHOUT CARDIOVERSION N/A 07/29/2019   Procedure: TRANSESOPHAGEAL ECHOCARDIOGRAM (TEE);  Surgeon: [name], MD;  Location: [place];  Service: Open Heart Surgery;  Laterality: N/A;   TOTAL THYROIDECTOMY         Family History  Problem Relation Age of Onset   Cancer Father    Cancer Mother     Asthma Brother    Asthma Brother    Lung disease Neg Hx     Social History   Tobacco Use   Smoking status: Every Day    Packs/day: 2.00    Years: 34.00    Pack years: 68.00    Types: Cigarettes    Last attempt to quit: 03/16/2019    Years since quitting: 2.3   Smokeless tobacco: Never   Tobacco comments:    started smoking 2 months ago doing about 1ppd as of 03/08/20  Vaping Use   Vaping Use: Never used  Substance Use Topics   Alcohol use: Not Currently    Home Medications Prior to Admission medications   Medication Sig Start Date End Date Taking? Authorizing Provider  albuterol (VENTOLIN HFA) 108 (90 Base) MCG/ACT inhaler Inhale 2 puffs into the lungs every 6 (six) hours as needed (wheezing/shortness of breath). 03/08/20   Garner Nash, DO  aspirin EC 81 MG tablet Take 1 tablet (81 mg total) by mouth daily. 10/22/19   Patwardhan, Reynold Bowen, MD  atorvastatin (LIPITOR) 40 MG tablet Take 40 mg by mouth at bedtime.  05/05/18 01/13/20  [provider]  atorvastatin (LIPITOR) 40 MG tablet Take 40 mg by mouth daily.    [provider]  Budeson-Glycopyrrol-Formoterol (BREZTRI AEROSPHERE) 160-9-4.8 MCG/ACT AERO Inhale 2 puffs into the lungs in the morning and at bedtime. Patient not taking: Reported on 02/27/2021 03/08/20   Garner Nash, DO  chlorthalidone (HYGROTON) 25 MG tablet  02/19/20   [provider]  diazepam (VALIUM) 5 MG tablet Take 1 tablet by mouth 2 (two) times daily as needed. 12/03/19   [provider]  fluticasone (FLOVENT HFA) 110 MCG/ACT inhaler Inhale 2 puffs into the lungs 2 (two) times daily. 03/20/20   [provider]  folic acid (FOLVITE) A999333 MCG tablet Take by mouth. 08/28/20   [provider]  hydrOXYzine (ATARAX/VISTARIL) 25 MG tablet Take by mouth. 11/22/20   [provider]  ibuprofen (ADVIL) 800 MG tablet Take 1 tablet by mouth daily as needed. 12/03/19   [provider]  levothyroxine  (SYNTHROID) 175 MCG tablet Take 175 mcg by mouth daily. 02/12/21   [provider]  levothyroxine (SYNTHROID, LEVOTHROID) 200 MCG tablet Take 200 mcg by mouth daily.  05/05/18 01/13/20  [provider]  losartan (COZAAR) 25 MG tablet Take 25 mg by mouth at bedtime.  05/27/18   [provider]  meloxicam (MOBIC) 15 MG tablet Take 1 tablet by mouth daily as needed. 02/20/21   [provider]  metoprolol tartrate (LOPRESSOR) 50 MG tablet Take 50 mg by mouth 2 (two) times daily. 02/07/21   [provider]  MOVANTIK 25 MG TABS tablet Take 25 mg by mouth daily. 02/11/21   [provider]  OXYGEN  Inhale 2 L into the lungs daily. 3L 24/7    [provider]  pantoprazole (PROTONIX) 40 MG tablet Take 1 tablet by mouth 2 (two) times daily. 01/24/21   [provider]  potassium chloride (MICRO-K) 10 MEQ CR capsule Take 10 mEq by mouth 2 (two) times daily. 06/28/19   [provider]  sertraline (ZOLOFT) 100 MG tablet Take 100 mg by mouth at bedtime.  05/05/18 01/13/20  [provider]  sertraline (ZOLOFT) 100 MG tablet Take 100 mg by mouth daily.    [provider]  SUBOXONE 8-2 MG FILM Place 1 Film under the tongue 2 (two) times daily.  07/21/18   [provider]  torsemide (DEMADEX) 20 MG tablet TAKE 2 TABLETS BY MOUTH EVERY DAY 02/26/21   Adrian Prows, MD    Allergies    Patient has no known allergies.  Review of Systems   Review of Systems  HENT:  Positive for ear pain and trouble swallowing.   Eyes:  Positive for visual disturbance.  Musculoskeletal:  Positive for neck pain and neck stiffness.  Neurological:  Positive for weakness and headaches.   Physical Exam Updated Vital Signs BP 125/71 (BP Location: Right Arm)   Pulse 65   Temp 98.2 F (36.8 C) (Oral)   Resp 18   SpO2 95%   Physical Exam Constitutional:      Appearance: Normal appearance.  HENT:     Head:     Jaw: Tenderness present.  Neck:      Comments: Round, superficial non-mobile protrusion over the anterior neck with associated ecchymosis Cardiovascular:     Rate and Rhythm: Normal rate and regular rhythm.  Pulmonary:     Effort: Pulmonary effort is normal.     Breath sounds: Normal breath sounds.  Abdominal:     General: Abdomen is flat.  Musculoskeletal:     Left shoulder: Deformity and tenderness present.     Cervical back: Muscular tenderness present.     Comments: Healed surgical scars with metal implants along the collarbone visible    ED Results / Procedures / Treatments   Labs (all labs ordered are listed, but only abnormal results are displayed) Labs Reviewed  BASIC METABOLIC PANEL  CBC    EKG None  Radiology No results found.  Procedures Procedures   Medications Ordered in ED Medications - No data to display  ED Course  I have reviewed the triage vital signs and the nursing notes.  Pertinent labs & imaging results that were available during my care of the patient were reviewed by me and considered in my medical decision making (see chart for details).    MDM Rules/Calculators/A&P                           CTA obtained to rule out carotid artery dissection, age indeterminate compression fracture noted at T2, though this likely not the cause of patient's pain which is coming from the cervical area. Favor that pain is related to exacerbation of chronic musculoskeletal defects. Patient is on methadone and should follow up with pain management for continued treatment of his pain.   Final Clinical Impression(s) / ED Diagnoses Final diagnoses:  None    Rx / DC Orders ED Discharge Orders     None        Scarlett Presto, MD 07/09/21 1152    Lacretia Leigh, MD 07/10/21 [phone]

## 2021-07-09 NOTE — Discharge Instructions (Addendum)
You were seen at [facility] Emergency department for neck pain. We got imaging of your neck to make sure that none of the blood vessels were damaged when you were working with PT. We did not see any new findings, and think that your pain is likely related to your prior injury/chronic musculoskeletal problems. You should follow up with your primary care doctor for continued management of your pain and can continue to work with PT. If you have any new onset dizziness, weakness causing difficulty walking or using your arms you should come back into the emergency room to be seen.

## 2021-07-09 NOTE — ED Triage Notes (Signed)
Pt reports having a shoulder surgery back in 2011 and having neck issues since. Pt reports recently doing PT for his neck and now endorses increased neck pain.

## 2021-07-09 NOTE — ED Provider Notes (Signed)
I provided a substantive portion of the care of this patient.  I personally performed the entirety of the medical decision making for this encounter.      55 year old male presents with pain to his neck after having PT therapy.  Denies any new focal weakness.  Will order CT angio to rule out vascular injury.  Likely discharge home   Lacretia Leigh, MD 07/09/21 [phone]

## 2021-08-07 ENCOUNTER — Other Ambulatory Visit: Payer: Self-pay | Admitting: Surgery

## 2021-08-07 DIAGNOSIS — Z952 Presence of prosthetic heart valve: Secondary | ICD-10-CM

## 2021-08-08 ENCOUNTER — Ambulatory Visit (INDEPENDENT_AMBULATORY_CARE_PROVIDER_SITE_OTHER): Payer: Medicare Other | Admitting: Surgery

## 2021-08-08 ENCOUNTER — Ambulatory Visit
Admission: RE | Admit: 2021-08-08 | Discharge: 2021-08-08 | Disposition: A | Discharge status: Home / Self Care | Payer: Medicare Other | Source: Ambulatory Visit | Attending: Surgery | Admitting: Surgery

## 2021-08-08 ENCOUNTER — Other Ambulatory Visit: Payer: Self-pay

## 2021-08-08 VITALS — BP 117/64 | HR 63 | Resp 20 | Wt 172.0 lb

## 2021-08-08 DIAGNOSIS — R0789 Other chest pain: Secondary | ICD-10-CM

## 2021-08-08 DIAGNOSIS — Z952 Presence of prosthetic heart valve: Secondary | ICD-10-CM

## 2021-08-09 ENCOUNTER — Other Ambulatory Visit: Payer: Self-pay

## 2021-08-09 DIAGNOSIS — R0789 Other chest pain: Secondary | ICD-10-CM

## 2021-08-10 ENCOUNTER — Encounter: Payer: Self-pay | Admitting: Surgery

## 2021-08-10 NOTE — Progress Notes (Signed)
HPI:  Henry Sheppard is a 55 year old gentleman who is well-known to me status post median sternotomy a with aortic root replacement using a 21 mm Medtronic freestyle aortic bioprosthesis on 07/29/2019 for severe prosthetic aortic valve stenosis of a previously placed 21 mm Edwards pericardial bioprosthesis in 2011 in [place].  At the time of his surgery his BMI was 35 and his PSA was 2.36.  He had an uneventful postoperative course.  He said that since that surgery he has lost close to 100 pounds.  He returns today because he has developed progressive muscle loss in his neck and has inability to hold his head up.  He said that he has been seen by neurosurgery at [facility] and was offered surgical repair with rods but is concerned because it would significantly limit his mobility.  He has been fitted with a neck brace but it is causing a lot of discomfort over the anterior sternum from his sternal wires which are now easily palpable under the skin since he has lost so much weight.  Current Outpatient Medications  Medication Sig Dispense Refill   albuterol (VENTOLIN HFA) 108 (90 Base) MCG/ACT inhaler Inhale 2 puffs into the lungs every 6 (six) hours as needed (wheezing/shortness of breath). 18 g 6   aspirin EC 81 MG tablet Take 1 tablet (81 mg total) by mouth daily. 90 tablet 3   atorvastatin (LIPITOR) 40 MG tablet Take 20 mg by mouth daily.     Budeson-Glycopyrrol-Formoterol (BREZTRI AEROSPHERE) 160-9-4.8 MCG/ACT AERO Inhale 2 puffs into the lungs in the morning and at bedtime. (Patient not taking: No sig reported) 5.9 g 0   diazepam (VALIUM) 5 MG tablet Take 5 mg by mouth 2 (two) times daily as needed for anxiety.     fluticasone (FLOVENT HFA) 110 MCG/ACT inhaler Inhale 2 puffs into the lungs 2 (two) times daily.     folic acid (FOLVITE) 379 MCG tablet Take 400 mcg by mouth daily.     hydrOXYzine (ATARAX/VISTARIL) 25 MG tablet Take 25 mg by mouth every 6 (six) hours as needed (pain).     ibuprofen  (ADVIL) 800 MG tablet Take 800 mg by mouth every 8 (eight) hours as needed for moderate pain.     levothyroxine (SYNTHROID) 175 MCG tablet Take 175 mcg by mouth daily before breakfast.     losartan (COZAAR) 25 MG tablet Take 25 mg by mouth at bedtime.      meloxicam (MOBIC) 15 MG tablet Take 15 mg by mouth daily as needed for pain.     metoprolol tartrate (LOPRESSOR) 50 MG tablet Take 50 mg by mouth 2 (two) times daily.     MOVANTIK 25 MG TABS tablet Take 25 mg by mouth daily.     OXYGEN Inhale 3 L into the lungs at bedtime.     pantoprazole (PROTONIX) 40 MG tablet Take 40 mg by mouth 2 (two) times daily.     potassium chloride (MICRO-K) 10 MEQ CR capsule Take 10 mEq by mouth 2 (two) times daily.     SUBOXONE 8-2 MG FILM Place 1 Film under the tongue in the morning, at noon, and at bedtime.  0   torsemide (DEMADEX) 20 MG tablet TAKE 2 TABLETS BY MOUTH EVERY DAY (Patient taking differently: Take 40 mg by mouth daily as needed (edema).) 180 tablet 0   predniSONE (DELTASONE) 20 MG tablet Take 60 mg by mouth daily.     sertraline (ZOLOFT) 100 MG tablet Take 200 mg by  mouth at bedtime.     No current facility-administered medications for this visit.     Physical Exam: BP 117/64 (BP Location: Left Arm, Patient Position: Sitting)   Pulse 63   Resp 20   Wt 172 lb (78 kg)   SpO2 100%   BMI 23.99 kg/m  He looks well.   The sternum is well-healed and stable.  The sternal wires are easily palpable beneath the skin since there is minimal subcutaneous tissue present anteriorly. Cardiac exam shows a regular rate and rhythm with normal heart sounds.  There is no murmur.  Diagnostic Tests:  Narrative & Impression  CLINICAL DATA:  Status post AVR.   EXAM: CHEST - 2 VIEW   COMPARISON:  September 01, 2019   FINDINGS: Multiple sternal wires are present. Mild, chronic appearing increased lung markings are seen bilaterally. There is no evidence of acute infiltrate, pleural effusion or pneumothorax.  The heart size and mediastinal contours are within normal limits. Radiopaque surgical clips are seen within the left upper quadrant. Radiopaque fixation plates and screws are seen overlying the right clavicle.   IMPRESSION: 1. Evidence of prior median sternotomy. 2. No acute or active cardiopulmonary disease.     Electronically Signed   By: Virgina Norfolk M.D.   On: 08/08/2021 16:56      Impression:  He has pain from his sternal wires that is preventing him from wearing a neck brace that goes down over the chest to support his drooping head.  I think the best option is to remove the sternal wires.  I discussed the operative procedure with him including the minimal risk of wound complications or infection.  I think these can be removed through 3 very small incisions along the sternum under deep sedation or general anesthesia.  Plan:  He will be scheduled for sternal wire removal on August 17, 2021.  I spent 20 minutes performing this established patient evaluation and > 50% of this time was spent face to face counseling and coordinating the care of this patient's protruding sternal wires.   [name], MD [facility] [phone]

## 2021-08-14 NOTE — Progress Notes (Signed)
Surgical Instructions    Your procedure is scheduled on 08/17/21.  Report to [facility] Main Entrance "A" at 05:30 A.M., then check in with the Admitting office.  Call this number if you have problems the morning of surgery:  [phone]   If you have any questions prior to your surgery date call [phone]: Open Monday-Friday 8am-4pm    Remember:  Do not eat after midnight the night before your surgery  You may drink clear liquids until 04:30am the morning of your surgery.   Clear liquids allowed are: Water, Non-Citrus Juices (without pulp), Carbonated Beverages, Clear Tea, Black Coffee ONLY (NO MILK, CREAM OR POWDERED CREAMER of any kind), and Gatorade    Take these medicines the morning of surgery with A SIP OF WATER  atorvastatin (LIPITOR) fluticasone (FLOVENT HFA) levothyroxine (SYNTHROID) metoprolol tartrate (LOPRESSOR) pantoprazole (PROTONIX) predniSONE (DELTASONE)  IF NEEDED: albuterol (VENTOLIN HFA) inhaler diazepam (VALIUM) hydrOXYzine (ATARAX/VISTARIL)  As of today, STOP taking any Aspirin (unless otherwise instructed by your surgeon) Aleve, Naproxen, Ibuprofen, Motrin, Advil, Goody's, BC's, all herbal medications, fish oil, meloxicam (MOBIC) and all vitamins.  Please stop taking SUBOXONE 3 days prior to surgery.           Do not wear jewelry or makeup Do not wear lotions, powders, perfumes/colognes, or deodorant. Men may shave face and neck. Do not bring valuables to the hospital. DO Not wear nail polish, gel polish, artificial nails, or any other type of covering on natural nails including finger and toenails. If patients have artificial nails, gel coating, etc. that need to be removed by a nail salon please have this removed prior to surgery or surgery may need to be canceled/delayed if the surgeon/ anesthesia feels like the patient is unable to be adequately monitored.             [facility] is not responsible for any belongings or valuables.  Do NOT Smoke  (Tobacco/Vaping)  24 hours prior to your procedure If you use a CPAP at night, you may bring your mask for your overnight stay.   Contacts, glasses, dentures or bridgework may not be worn into surgery, please bring cases for these belongings   For patients admitted to the hospital, discharge time will be determined by your treatment team.   Patients discharged the day of surgery will not be allowed to drive home, and someone needs to stay with them for 24 hours.  NO VISITORS WILL BE ALLOWED IN PRE-OP WHERE PATIENTS GET READY FOR SURGERY.  ONLY 1 SUPPORT PERSON MAY BE PRESENT IN THE WAITING ROOM WHILE YOU ARE IN SURGERY.  IF YOU ARE TO BE ADMITTED, ONCE YOU ARE IN YOUR ROOM YOU WILL BE ALLOWED TWO (2) VISITORS.  Minor children may have two parents present. Special consideration for safety and communication needs will be reviewed on a case by case basis.  Special instructions:    Oral Hygiene is also important to reduce your risk of infection.  Remember - BRUSH YOUR TEETH THE MORNING OF SURGERY WITH YOUR REGULAR TOOTHPASTE   [facility]- Preparing For Surgery  Before surgery, you can play an important role. Because skin is not sterile, your skin needs to be as free of germs as possible. You can reduce the number of germs on your skin by washing with CHG (chlorahexidine gluconate) Soap before surgery.  CHG is an antiseptic cleaner which kills germs and bonds with the skin to continue killing germs even after washing.     Please do not  use if you have an allergy to CHG or antibacterial soaps. If your skin becomes reddened/irritated stop using the CHG.  Do not shave (including legs and underarms) for at least 48 hours prior to first CHG shower. It is OK to shave your face.  Please follow these instructions carefully.     Shower the NIGHT BEFORE SURGERY and the MORNING OF SURGERY with CHG Soap.   If you chose to wash your hair, wash your hair first as usual with your normal shampoo. After you  shampoo, rinse your hair and body thoroughly to remove the shampoo.  Then [employer] and genitals (private parts) with your normal soap and rinse thoroughly to remove soap.  After that Use CHG Soap as you would any other liquid soap. You can apply CHG directly to the skin and wash gently with a scrungie or a clean washcloth.   Apply the CHG Soap to your body ONLY FROM THE NECK DOWN.  Do not use on open wounds or open sores. Avoid contact with your eyes, ears, mouth and genitals (private parts). Wash Face and genitals (private parts)  with your normal soap.   Wash thoroughly, paying special attention to the area where your surgery will be performed.  Thoroughly rinse your body with warm water from the neck down.  DO NOT shower/wash with your normal soap after using and rinsing off the CHG Soap.  Pat yourself dry with a CLEAN TOWEL.  Wear CLEAN PAJAMAS to bed the night before surgery  Place CLEAN SHEETS on your bed the night before your surgery  DO NOT SLEEP WITH PETS.   Day of Surgery: Take a shower with CHG soap. Wear Clean/Comfortable clothing the morning of surgery Do not apply any deodorants/lotions.   Remember to brush your teeth WITH YOUR REGULAR TOOTHPASTE.   Please read over the following fact sheets that you were given.

## 2021-08-15 ENCOUNTER — Inpatient Hospital Stay (HOSPITAL_COMMUNITY)
Admission: RE | Admit: 2021-08-15 | Discharge: 2021-08-15 | Disposition: A | Discharge status: Home / Self Care | Payer: Medicare Other | Source: Ambulatory Visit

## 2021-08-16 ENCOUNTER — Encounter (HOSPITAL_COMMUNITY)
Admission: RE | Admit: 2021-08-16 | Discharge: 2021-08-16 | Disposition: A | Discharge status: Home / Self Care | Payer: Medicare Other | Source: Ambulatory Visit | Attending: Surgery | Admitting: Surgery

## 2021-08-16 ENCOUNTER — Encounter (HOSPITAL_COMMUNITY): Payer: Self-pay

## 2021-08-16 ENCOUNTER — Encounter (HOSPITAL_COMMUNITY): Payer: Self-pay | Admitting: Surgery

## 2021-08-16 ENCOUNTER — Other Ambulatory Visit: Payer: Self-pay

## 2021-08-16 DIAGNOSIS — Z01818 Encounter for other preprocedural examination: Secondary | ICD-10-CM | POA: Diagnosis present

## 2021-08-16 DIAGNOSIS — R0789 Other chest pain: Secondary | ICD-10-CM | POA: Insufficient documentation

## 2021-08-16 LAB — APTT: aPTT: 26 seconds (ref 24–36)

## 2021-08-16 LAB — COMPREHENSIVE METABOLIC PANEL
ALT: 36 U/L (ref 0–44)
AST: 31 U/L (ref 15–41)
Albumin: 3.7 g/dL (ref 3.5–5.0)
Alkaline Phosphatase: 64 U/L (ref 38–126)
Anion gap: 7 (ref 5–15)
BUN: 20 mg/dL (ref 6–20)
CO2: 34 mmol/L — ABNORMAL HIGH (ref 22–32)
Calcium: 9.2 mg/dL (ref 8.9–10.3)
Chloride: 98 mmol/L (ref 98–111)
Creatinine, Ser: 0.74 mg/dL (ref 0.61–1.24)
GFR, Estimated: >60 mL/min (ref >60)
Glucose, Bld: 136 mg/dL — ABNORMAL HIGH (ref 70–99)
Potassium: 4.4 mmol/L (ref 3.5–5.1)
Sodium: 139 mmol/L (ref 135–145)
Total Bilirubin: 0.7 mg/dL (ref 0.3–1.2)
Total Protein: 6.1 g/dL — ABNORMAL LOW (ref 6.5–8.1)

## 2021-08-16 LAB — SURGICAL PCR SCREEN
MRSA, PCR: NEGATIVE
Staphylococcus aureus: POSITIVE — AB

## 2021-08-16 LAB — TYPE AND SCREEN
ABO/RH(D): A POS
Antibody Screen: NEGATIVE

## 2021-08-16 LAB — CBC
HCT: 43.5 % (ref 39.0–52.0)
Hemoglobin: 14 g/dL (ref 13.0–17.0)
MCH: 30.8 pg (ref 26.0–34.0)
MCHC: 32.2 g/dL (ref 30.0–36.0)
MCV: 95.8 fL (ref 80.0–100.0)
Platelets: 188 10*3/uL (ref 150–400)
RBC: 4.54 MIL/uL (ref 4.22–5.81)
RDW: 16.8 % — ABNORMAL HIGH (ref 11.5–15.5)
WBC: 13.1 10*3/uL — ABNORMAL HIGH (ref 4.0–10.5)
nRBC: 0 % (ref 0.0–0.2)

## 2021-08-16 LAB — PROTIME-INR
INR: 0.9 (ref 0.8–1.2)
Prothrombin Time: 12.4 seconds (ref 11.4–15.2)

## 2021-08-16 NOTE — Progress Notes (Signed)
PCP - Lilian Coma Cardiologist -   PPM/ICD - denies Device Orders -  Rep Notified -   Chest x-ray - 08/16/21 EKG - 08/16/21 Stress Test - none ECHO - 09/03/19 Cardiac Cath - 12/22/18  Sleep Study - no CPAP - no  Fasting Blood Sugar -n/a  Checks Blood Sugar _____ times a day  Blood Thinner Instructions:n/a Aspirin Instructions:pt says he will contact Dr. Vivi Martens office for instructions  ERAS Protcol - clears until 0430 PRE-SURGERY Ensure or G2- no  COVID TEST- n/a, ambulatory surgery.  Anesthesia review: yes-home O2; started on Augmentin on 9/26 for "lung infection";left bundle branch block on EKG. Karoline Caldwell made aware of these issues and the need for anesthesia review.   Patient denies shortness of breath, fever, cough and chest pain at PAT appointment   All instructions explained to the patient, with a verbal understanding of the material. Patient agrees to go over the instructions while at home for a better understanding. Patient also instructed to self quarantine after being tested for COVID-19. The opportunity to ask questions was provided.

## 2021-08-16 NOTE — Progress Notes (Signed)
Anesthesia Chart Review:  Spoke with patient at PAT appointment as he reported he was recently started on Augmentin for a lung infection.  On further review, the patient had recent CT scan ordered by gastroenterologist for abdominal pain.  The CT scan was done 08/10/2021 and showed 2 groundglass opacities in the left lower lobe with notation that they could be infectious. There were no acute findings in the abdomen or pelvis.  Patient was completely asymptomatic at that time from a pulmonary standpoint and has remained asymptomatic.  He denies any cough, fever, shortness of breath, chest pain.  He does have a baseline, mildly productive, "smoker's cough", but denies any change in this, denies any change in sputum production.  He states that due to this incidental finding on CT, his gastroenterologist started him on Augmentin, which he started taking yesterday, 08/15/2021.  On exam breathing is unlabored, he is in no acute distress, lungs CTAB, oxygen saturation 96% on room air.  Preop labs reviewed, WBC mildly elevated at 13.1, however this is essentially unchanged from prior labs on 07/09/2021 which showed WBC 13.6.  Remainder of labs unremarkable.  I called Levonne Spiller, RN at [facility] regarding patient's recent prescription for Augmentin for questionable infection.  She related the information to Dr. Cyndia Bent, who reviewed CXR done at PAT and advised okay to proceed.  Wynonia Musty [facility] Short Stay Center/Anesthesiology Phone [phone] 08/16/2021 6:00 PM

## 2021-08-17 ENCOUNTER — Ambulatory Visit (HOSPITAL_COMMUNITY): Payer: Medicare Other | Admitting: Physician Assistant

## 2021-08-17 ENCOUNTER — Encounter (HOSPITAL_COMMUNITY): Payer: Self-pay | Admitting: Surgery

## 2021-08-17 ENCOUNTER — Ambulatory Visit (HOSPITAL_COMMUNITY): Payer: Medicare Other

## 2021-08-17 ENCOUNTER — Encounter (HOSPITAL_COMMUNITY)
Admission: RE | Disposition: A | Discharge status: Home / Self Care | Payer: Self-pay | Source: Home / Self Care | Attending: Surgery

## 2021-08-17 ENCOUNTER — Ambulatory Visit (HOSPITAL_COMMUNITY)
Admission: RE | Admit: 2021-08-17 | Discharge: 2021-08-17 | Disposition: A | Discharge status: Home / Self Care | Payer: Medicare Other | Attending: Surgery | Admitting: Surgery

## 2021-08-17 ENCOUNTER — Other Ambulatory Visit: Payer: Self-pay

## 2021-08-17 DIAGNOSIS — Z952 Presence of prosthetic heart valve: Secondary | ICD-10-CM | POA: Diagnosis not present

## 2021-08-17 DIAGNOSIS — Z8571 Personal history of Hodgkin lymphoma: Secondary | ICD-10-CM | POA: Diagnosis not present

## 2021-08-17 DIAGNOSIS — Z87891 Personal history of nicotine dependence: Secondary | ICD-10-CM | POA: Insufficient documentation

## 2021-08-17 DIAGNOSIS — I2510 Atherosclerotic heart disease of native coronary artery without angina pectoris: Secondary | ICD-10-CM | POA: Insufficient documentation

## 2021-08-17 DIAGNOSIS — Z951 Presence of aortocoronary bypass graft: Secondary | ICD-10-CM | POA: Diagnosis not present

## 2021-08-17 DIAGNOSIS — I10 Essential (primary) hypertension: Secondary | ICD-10-CM | POA: Insufficient documentation

## 2021-08-17 DIAGNOSIS — Z79899 Other long term (current) drug therapy: Secondary | ICD-10-CM | POA: Insufficient documentation

## 2021-08-17 DIAGNOSIS — Z7952 Long term (current) use of systemic steroids: Secondary | ICD-10-CM | POA: Diagnosis not present

## 2021-08-17 DIAGNOSIS — J449 Chronic obstructive pulmonary disease, unspecified: Secondary | ICD-10-CM | POA: Diagnosis not present

## 2021-08-17 DIAGNOSIS — Z7951 Long term (current) use of inhaled steroids: Secondary | ICD-10-CM | POA: Insufficient documentation

## 2021-08-17 DIAGNOSIS — I071 Rheumatic tricuspid insufficiency: Secondary | ICD-10-CM | POA: Diagnosis not present

## 2021-08-17 DIAGNOSIS — E7800 Pure hypercholesterolemia, unspecified: Secondary | ICD-10-CM | POA: Insufficient documentation

## 2021-08-17 DIAGNOSIS — I350 Nonrheumatic aortic (valve) stenosis: Secondary | ICD-10-CM | POA: Diagnosis not present

## 2021-08-17 DIAGNOSIS — Z7982 Long term (current) use of aspirin: Secondary | ICD-10-CM | POA: Diagnosis not present

## 2021-08-17 DIAGNOSIS — R0789 Other chest pain: Secondary | ICD-10-CM

## 2021-08-17 DIAGNOSIS — T85698A Other mechanical complication of other specified internal prosthetic devices, implants and grafts, initial encounter: Secondary | ICD-10-CM | POA: Diagnosis present

## 2021-08-17 DIAGNOSIS — T85628A Displacement of other specified internal prosthetic devices, implants and grafts, initial encounter: Secondary | ICD-10-CM | POA: Insufficient documentation

## 2021-08-17 DIAGNOSIS — X58XXXA Exposure to other specified factors, initial encounter: Secondary | ICD-10-CM | POA: Diagnosis not present

## 2021-08-17 DIAGNOSIS — Z791 Long term (current) use of non-steroidal anti-inflammatories (NSAID): Secondary | ICD-10-CM | POA: Diagnosis not present

## 2021-08-17 DIAGNOSIS — T85898A Other specified complication of other internal prosthetic devices, implants and grafts, initial encounter: Secondary | ICD-10-CM | POA: Diagnosis not present

## 2021-08-17 DIAGNOSIS — Z79890 Hormone replacement therapy (postmenopausal): Secondary | ICD-10-CM | POA: Insufficient documentation

## 2021-08-17 HISTORY — PX: STERNAL WIRES REMOVAL: SHX2441

## 2021-08-17 SURGERY — REMOVAL, STERNAL WIRE
Anesthesia: Monitor Anesthesia Care | Site: Chest

## 2021-08-17 MED ORDER — OXYCODONE HCL 5 MG PO TABS: 5.0000 mg | ORAL_TABLET | Freq: Once | ORAL | Status: DC | PRN

## 2021-08-17 MED ORDER — FENTANYL CITRATE (PF) 100 MCG/2ML IJ SOLN: 25.0000 ug | INTRAMUSCULAR | Status: DC | PRN

## 2021-08-17 MED ORDER — PROPOFOL 500 MG/50ML IV EMUL
INTRAVENOUS | Status: DC | PRN
  Administered 2021-08-17: 75 ug/kg/min via INTRAVENOUS

## 2021-08-17 MED ORDER — ACETAMINOPHEN 10 MG/ML IV SOLN: 1000.0000 mg | Freq: Once | INTRAVENOUS | Status: DC | PRN

## 2021-08-17 MED ORDER — ACETAMINOPHEN 160 MG/5ML PO SOLN: 325.0000 mg | ORAL | Status: DC | PRN

## 2021-08-17 MED ORDER — PROPOFOL 1000 MG/100ML IV EMUL
INTRAVENOUS | Status: AC
  Filled 2021-08-17: qty 100

## 2021-08-17 MED ORDER — ACETAMINOPHEN 325 MG PO TABS: 325.0000 mg | ORAL_TABLET | ORAL | Status: DC | PRN

## 2021-08-17 MED ORDER — 0.9 % SODIUM CHLORIDE (POUR BTL) OPTIME
TOPICAL | Status: DC | PRN
  Administered 2021-08-17: 2000 mL

## 2021-08-17 MED ORDER — MIDAZOLAM HCL 2 MG/2ML IJ SOLN
INTRAMUSCULAR | Status: AC
  Filled 2021-08-17: qty 2

## 2021-08-17 MED ORDER — PROPOFOL 10 MG/ML IV BOLUS
INTRAVENOUS | Status: AC
  Filled 2021-08-17: qty 40

## 2021-08-17 MED ORDER — CEFAZOLIN SODIUM-DEXTROSE 2-4 GM/100ML-% IV SOLN
2.0000 g | INTRAVENOUS | Status: AC
  Administered 2021-08-17: 2 g via INTRAVENOUS
  Filled 2021-08-17: qty 100

## 2021-08-17 MED ORDER — LIDOCAINE HCL (PF) 1 % IJ SOLN
INTRAMUSCULAR | Status: DC | PRN
  Administered 2021-08-17: 30 mL

## 2021-08-17 MED ORDER — LIDOCAINE HCL (PF) 1 % IJ SOLN
INTRAMUSCULAR | Status: AC
  Filled 2021-08-17: qty 30

## 2021-08-17 MED ORDER — ONDANSETRON HCL 4 MG/2ML IJ SOLN
INTRAMUSCULAR | Status: DC | PRN
  Administered 2021-08-17: 4 mg via INTRAVENOUS

## 2021-08-17 MED ORDER — EPHEDRINE 5 MG/ML INJ
INTRAVENOUS | Status: AC
  Filled 2021-08-17: qty 5

## 2021-08-17 MED ORDER — CHLORHEXIDINE GLUCONATE 0.12 % MT SOLN: 15.0000 mL | Freq: Once | OROMUCOSAL | Status: AC

## 2021-08-17 MED ORDER — FENTANYL CITRATE (PF) 250 MCG/5ML IJ SOLN
INTRAMUSCULAR | Status: DC | PRN
  Administered 2021-08-17 (×6): 25 ug via INTRAVENOUS

## 2021-08-17 MED ORDER — AMISULPRIDE (ANTIEMETIC) 5 MG/2ML IV SOLN: 10.0000 mg | Freq: Once | INTRAVENOUS | Status: DC | PRN

## 2021-08-17 MED ORDER — CHLORHEXIDINE GLUCONATE 4 % EX LIQD: 60.0000 mL | Freq: Once | CUTANEOUS | Status: DC

## 2021-08-17 MED ORDER — OXYCODONE HCL 5 MG/5ML PO SOLN: 5.0000 mg | Freq: Once | ORAL | Status: DC | PRN

## 2021-08-17 MED ORDER — PROMETHAZINE HCL 25 MG/ML IJ SOLN: 6.2500 mg | INTRAMUSCULAR | Status: DC | PRN

## 2021-08-17 MED ORDER — CHLORHEXIDINE GLUCONATE 0.12 % MT SOLN
OROMUCOSAL | Status: AC
  Administered 2021-08-17: 15 mL via OROMUCOSAL
  Filled 2021-08-17: qty 15

## 2021-08-17 MED ORDER — EPHEDRINE SULFATE-NACL 50-0.9 MG/10ML-% IV SOSY
PREFILLED_SYRINGE | INTRAVENOUS | Status: DC | PRN
  Administered 2021-08-17 (×2): 5 mg via INTRAVENOUS

## 2021-08-17 MED ORDER — ORAL CARE MOUTH RINSE: 15.0000 mL | Freq: Once | OROMUCOSAL | Status: AC

## 2021-08-17 MED ORDER — LACTATED RINGERS IV SOLN: INTRAVENOUS | Status: DC

## 2021-08-17 MED ORDER — ONDANSETRON HCL 4 MG/2ML IJ SOLN
INTRAMUSCULAR | Status: AC
  Filled 2021-08-17: qty 2

## 2021-08-17 MED ORDER — MIDAZOLAM HCL 5 MG/5ML IJ SOLN
INTRAMUSCULAR | Status: DC | PRN
Start: 2021-08-17 — End: 2021-08-17
  Administered 2021-08-17: 2 mg via INTRAVENOUS

## 2021-08-17 MED ORDER — FENTANYL CITRATE (PF) 250 MCG/5ML IJ SOLN
INTRAMUSCULAR | Status: AC
  Filled 2021-08-17: qty 5

## 2021-08-17 SURGICAL SUPPLY — 45 items
ATTRACTOMAT 16X20 MAGNETIC DRP (DRAPES) ×2 IMPLANT
BLADE CLIPPER SURG (BLADE) ×2 IMPLANT
BLADE SURG 10 STRL SS (BLADE) ×4 IMPLANT
CANISTER SUCT 3000ML PPV (MISCELLANEOUS) ×2 IMPLANT
CLIP VESOCCLUDE SM WIDE 24/CT (CLIP) IMPLANT
CNTNR URN SCR LID CUP LEK RST (MISCELLANEOUS) IMPLANT
CONT SPEC 4OZ STRL OR WHT (MISCELLANEOUS)
COVER SURGICAL LIGHT HANDLE (MISCELLANEOUS) ×6 IMPLANT
DERMABOND ADVANCED (GAUZE/BANDAGES/DRESSINGS) ×1
DERMABOND ADVANCED .7 DNX12 (GAUZE/BANDAGES/DRESSINGS) ×1 IMPLANT
DRAPE LAPAROSCOPIC ABDOMINAL (DRAPES) ×2 IMPLANT
DRAPE WARM FLUID 44X44 (DRAPES) IMPLANT
ELECT BLADE 4.0 EZ CLEAN MEGAD (MISCELLANEOUS) ×2
ELECT REM PT RETURN 9FT ADLT (ELECTROSURGICAL) ×2
ELECTRODE BLDE 4.0 EZ CLN MEGD (MISCELLANEOUS) ×1 IMPLANT
ELECTRODE REM PT RTRN 9FT ADLT (ELECTROSURGICAL) ×1 IMPLANT
GAUZE SPONGE 4X4 12PLY STRL (GAUZE/BANDAGES/DRESSINGS) ×2 IMPLANT
GAUZE SPONGE 4X4 12PLY STRL LF (GAUZE/BANDAGES/DRESSINGS) ×2 IMPLANT
GAUZE XEROFORM 5X9 LF (GAUZE/BANDAGES/DRESSINGS) IMPLANT
GLOVE SURG MICRO LTX SZ7 (GLOVE) ×4 IMPLANT
GOWN STRL REUS W/ TWL LRG LVL3 (GOWN DISPOSABLE) ×2 IMPLANT
GOWN STRL REUS W/ TWL XL LVL3 (GOWN DISPOSABLE) ×1 IMPLANT
GOWN STRL REUS W/TWL LRG LVL3 (GOWN DISPOSABLE) ×2
GOWN STRL REUS W/TWL XL LVL3 (GOWN DISPOSABLE) ×1
KIT BASIN OR (CUSTOM PROCEDURE TRAY) ×2 IMPLANT
KIT SUCTION CATH 14FR (SUCTIONS) IMPLANT
KIT TURNOVER KIT B (KITS) ×2 IMPLANT
MARKER SKIN DUAL TIP RULER LAB (MISCELLANEOUS) IMPLANT
NEEDLE HYPO 25GX1X1/2 BEV (NEEDLE) ×2 IMPLANT
NS IRRIG 1000ML POUR BTL (IV SOLUTION) ×2 IMPLANT
PACK CHEST (CUSTOM PROCEDURE TRAY) ×2 IMPLANT
PAD ARMBOARD 7.5X6 YLW CONV (MISCELLANEOUS) ×4 IMPLANT
PENCIL BUTTON HOLSTER BLD 10FT (ELECTRODE) ×2 IMPLANT
SOL PREP POV-IOD 4OZ 10% (MISCELLANEOUS) IMPLANT
SPONGE T-LAP 18X18 ~~LOC~~+RFID (SPONGE) ×4 IMPLANT
SUT STEEL 6MS V (SUTURE) IMPLANT
SUT VIC AB 1 CTX 36 (SUTURE)
SUT VIC AB 1 CTX36XBRD ANBCTR (SUTURE) IMPLANT
SUT VIC AB 2-0 CTX 36 (SUTURE) IMPLANT
SUT VIC AB 3-0 X1 27 (SUTURE) ×4 IMPLANT
SYR 10ML LL (SYRINGE) ×2 IMPLANT
TAPE CLOTH SURG 4X10 WHT LF (GAUZE/BANDAGES/DRESSINGS) ×2 IMPLANT
TOWEL GREEN STERILE (TOWEL DISPOSABLE) ×2 IMPLANT
TOWEL GREEN STERILE FF (TOWEL DISPOSABLE) ×2 IMPLANT
WATER STERILE IRR 1000ML POUR (IV SOLUTION) ×2 IMPLANT

## 2021-08-17 NOTE — Discharge Instructions (Addendum)
Incisions are covered with Dermabond waterproof surgical adhesive. You may shower. You may put a dressing over the incisions but you do not need to. Do not apply creams or lotions to incisions because it will dissolve the Dermabond. Take Tylenol or Ibuprofen prn for pain.

## 2021-08-17 NOTE — Progress Notes (Signed)
Spoke to [place] in Radiology to notify that chest x ray that was done yesterday has not resulted in Epic. Per Caryl Pina, she will notify a radiologist to read the x ray.

## 2021-08-17 NOTE — Interval H&P Note (Signed)
History and Physical Interval Note:  08/17/2021 6:40 AM  Henry Sheppard  has presented today for surgery, with the diagnosis of sternal pain.  The various methods of treatment have been discussed with the patient and family. After consideration of risks, benefits and other options for treatment, the patient has consented to  Procedure(s): STERNAL WIRES REMOVAL (N/A) as a surgical intervention.  The patient's history has been reviewed, patient examined, no change in status, stable for surgery.  I have reviewed the patient's chart and labs.  Questions were answered to the patient's satisfaction.     [name]

## 2021-08-17 NOTE — Op Note (Signed)
  CARDIOVASCULAR SURGERY OPERATIVE NOTE  08/17/2021  Surgeon:  [name], MD  First Assistant: none   Preoperative Diagnosis:  Protruding sternal wires s/p redo median sternotomy and aortic root replacement.   Postoperative Diagnosis:  Same   Procedure:  Removal of sternal wires  Anesthesia:  MAC with local   Clinical History/Surgical Indication:  He has pain from his sternal wires that is preventing him from wearing a neck brace that goes down over the chest to support his drooping head.  I think the best option is to remove the sternal wires.  I discussed the operative procedure with him including the minimal risk of wound complications or infection.  I think these can be removed through 3 very small incisions along the sternum under deep sedation or general anesthesia.  I discussed the operative procedure with the patient including the risk of wound infection and he understood and agreed to proceed.  Preparation:  The patient was seen in the preoperative holding area and the correct patient, correct operation were confirmed with the patient after reviewing the medical record and catheterization. The consent was signed by me. Preoperative antibiotics were given.  The patient was taken back to the operating room and positioned supine on the operating room table. After being placed under MAC the neck and chest were prepped with betadine soap and solution and draped in the usual sterile manner. A surgical time-out was taken and the correct patient and operative procedure were confirmed with the nursing and anesthesia staff.   Removal of sternal wires:  The skin and subcutaneous tissue over the sternal wires was anesthetized with 1% lidocaine local anesthesia.  Four 1 cm incisions were made vertically over the sternal wires with the incisions being placed between sternal wire so that 2 wires  could be removed through each incision.  The incisions were carried down through the subcutaneous tissue using electrocautery.  The sternal wires were removed without difficulty.  All 8 wires were removed.  There is complete hemostasis.  The skin was closed with continuous 3-0 Vicryl subcuticular skin skin closure and Dermabond was applied to the incisions.  The sponge needle instrument counts were correct according to scrub nurse.  The patient was transported to the postanesthesia care unit in satisfactory condition.

## 2021-08-17 NOTE — H&P (Signed)
[address]       [place] [address]             [phone]      Cardiothoracic Surgery Admission History and Physical   HPI:   [name] is a 55 year old gentleman who is well-known to me status post median sternotomy a with aortic root replacement using a 21 mm Medtronic freestyle aortic bioprosthesis on 07/29/2019 for severe prosthetic aortic valve stenosis of a previously placed 21 mm Edwards pericardial bioprosthesis in 2011 in [place].  At the time of his surgery his BMI was 35 and his PSA was 2.36.  He had an uneventful postoperative course.  He said that since that surgery he has lost close to 100 pounds.  He returns today because he has developed progressive muscle loss in his neck and has inability to hold his head up.  He said that he has been seen by neurosurgery at [facility] and was offered surgical repair with rods but is concerned because it would significantly limit his mobility.  He has been fitted with a neck brace but it is causing a lot of discomfort over the anterior sternum from his sternal wires which are now easily palpable under the skin since he has lost so much weight.         Current Outpatient Medications  Medication Sig Dispense Refill   albuterol (VENTOLIN HFA) 108 (90 Base) MCG/ACT inhaler Inhale 2 puffs into the lungs every 6 (six) hours as needed (wheezing/shortness of breath). 18 g 6   aspirin EC 81 MG tablet Take 1 tablet (81 mg total) by mouth daily. 90 tablet 3   atorvastatin (LIPITOR) 40 MG tablet Take 20 mg by mouth daily.       Budeson-Glycopyrrol-Formoterol (BREZTRI AEROSPHERE) 160-9-4.8 MCG/ACT AERO Inhale 2 puffs into the lungs in the morning and at bedtime. (Patient not taking: No sig reported) 5.9 g 0   diazepam (VALIUM) 5 MG tablet Take 5 mg by mouth 2 (two) times daily as needed for anxiety.       fluticasone (FLOVENT HFA) 110 MCG/ACT inhaler Inhale 2 puffs into the lungs 2 (two) times daily.       folic acid (FOLVITE)  258 MCG tablet Take 400 mcg by mouth daily.       hydrOXYzine (ATARAX/VISTARIL) 25 MG tablet Take 25 mg by mouth every 6 (six) hours as needed (pain).       ibuprofen (ADVIL) 800 MG tablet Take 800 mg by mouth every 8 (eight) hours as needed for moderate pain.       levothyroxine (SYNTHROID) 175 MCG tablet Take 175 mcg by mouth daily before breakfast.       losartan (COZAAR) 25 MG tablet Take 25 mg by mouth at bedtime.        meloxicam (MOBIC) 15 MG tablet Take 15 mg by mouth daily as needed for pain.       metoprolol tartrate (LOPRESSOR) 50 MG tablet Take 50 mg by mouth 2 (two) times daily.       MOVANTIK 25 MG TABS tablet Take 25 mg by mouth daily.       OXYGEN Inhale 3 L into the lungs at bedtime.       pantoprazole (PROTONIX) 40 MG tablet Take 40 mg by mouth 2 (two) times daily.       potassium chloride (MICRO-K) 10 MEQ CR capsule Take 10 mEq by mouth 2 (two) times daily.  SUBOXONE 8-2 MG FILM Place 1 Film under the tongue in the morning, at noon, and at bedtime.   0   torsemide (DEMADEX) 20 MG tablet TAKE 2 TABLETS BY MOUTH EVERY DAY (Patient taking differently: Take 40 mg by mouth daily as needed (edema).) 180 tablet 0   predniSONE (DELTASONE) 20 MG tablet Take 60 mg by mouth daily.       sertraline (ZOLOFT) 100 MG tablet Take 200 mg by mouth at bedtime.        No current facility-administered medications for this visit.      Past Medical History:  Diagnosis Date   H/O colonoscopy 2018    NORMAL   H/O echocardiogram 10/07/2018    MODERATE TO SEVERE AORTIC VALVE STENOSIS..EF 40-50%   H/O endoscopy 2018   Hodgkins lymphoma ([place])      in remission   Hyperlipidemia, group A     Hypertension, benign     Narcotic dependence, in remission [facility])               Past Surgical History:  Procedure Laterality Date   AORTIC VALVE REPLACEMENT        21 mm pericardial tissue valve 2011 in [place]   CORONARY ARTERY BYPASS GRAFT       RIGHT/LEFT HEART CATH AND CORONARY ANGIOGRAPHY  N/A 12/22/2018    Procedure: RIGHT/LEFT HEART CATH AND CORONARY ANGIOGRAPHY; Surgeon: Nigel Mormon, MD; Location: [facility]; Service: Cardiovascular; Laterality: N/A;   SPLENECTOMY       TEE WITHOUT CARDIOVERSION N/A 12/22/2018    Procedure: TRANSESOPHAGEAL ECHOCARDIOGRAM (TEE); Surgeon: Nigel Mormon, MD; Location: [facility] ENDOSCOPY; Service: Cardiovascular; Laterality: N/A;   TOTAL THYROIDECTOMY      Right clavicle fracture status post ORIF   Right shoulder surgery x2   Redo median sternotomy and aortic root replacement using a 21 mm Medtronic Freestyle aortic bioprosthesis on 07/29/2019.             Family History  Problem Relation Age of Onset   Cancer Father     Lung disease Neg Hx    Social History  Social History             Tobacco Use   Smoking status: Quit 12/2018      Packs/day: 1.50      Years: 34.00      Pack years: 51.00      Types: Cigarettes   Smokeless tobacco: Never Used   Tobacco comment: 2 ppd  Substance Use Topics   Alcohol use: Not Currently   Drug use: Not on file      Comment: smokes 1 joint daily      Physical Exam: BP 117/64 (BP Location: Left Arm, Patient Position: Sitting)   Pulse 63   Resp 20   Wt 172 lb (78 kg)   SpO2 100%   BMI 23.99 kg/m  He looks well.   The sternum is well-healed and stable.  The sternal wires are easily palpable beneath the skin since there is minimal subcutaneous tissue present anteriorly. Cardiac exam shows a regular rate and rhythm with normal heart sounds.  There is no murmur.   Diagnostic Tests:   Narrative & Impression  CLINICAL DATA:  Status post AVR.   EXAM: CHEST - 2 VIEW   COMPARISON:  September 01, 2019   FINDINGS: Multiple sternal wires are present. Mild, chronic appearing increased lung markings are seen bilaterally. There is no evidence of acute infiltrate, pleural effusion or pneumothorax.  The heart size and mediastinal contours are within normal limits. Radiopaque surgical  clips are seen within the left upper quadrant. Radiopaque fixation plates and screws are seen overlying the right clavicle.   IMPRESSION: 1. Evidence of prior median sternotomy. 2. No acute or active cardiopulmonary disease.     Electronically Signed   By: Virgina Norfolk M.D.   On: 08/08/2021 16:56        Impression:   He has pain from his sternal wires that is preventing him from wearing a neck brace that goes down over the chest to support his drooping head.  I think the best option is to remove the sternal wires.  I discussed the operative procedure with him including the minimal risk of wound complications or infection.  I think these can be removed through 3 very small incisions along the sternum under deep sedation or general anesthesia.   Plan:   Sternal wire removal.     [name], MD [facility] [phone]

## 2021-08-17 NOTE — Brief Op Note (Signed)
08/17/2021  8:37 AM  PATIENT:  Henry Sheppard  55 y.o. male  PRE-OPERATIVE DIAGNOSIS:  sternal pain  POST-OPERATIVE DIAGNOSIS:  sternal pain  PROCEDURE:  Procedure(s): STERNAL WIRES REMOVAL (N/A)  SURGEON:  Surgeon(s) and Role:    * Jonmichael Beadnell, Fernande Boyden, MD - Primary  PHYSICIAN ASSISTANT: none  ASSISTANTS: none  ANESTHESIA:   local and MAC  EBL:  5 mL   BLOOD ADMINISTERED:none  DRAINS: none   LOCAL MEDICATIONS USED:  LIDOCAINE   SPECIMEN:  No Specimen  DISPOSITION OF SPECIMEN:  N/A  COUNTS:  YES  TOURNIQUET:  * No tourniquets in log *  DICTATION: .Note written in EPIC  PLAN OF CARE: Discharge to home after PACU  PATIENT DISPOSITION:  PACU - hemodynamically stable.   Delay start of Pharmacological VTE agent (>24hrs) due to surgical blood loss or risk of bleeding: not applicable

## 2021-08-17 NOTE — Anesthesia Preprocedure Evaluation (Addendum)
Anesthesia Evaluation  Patient identified by MRN, date of birth, ID band Patient awake    Reviewed: Allergy & Precautions, NPO status , Patient's Chart, lab work & pertinent test results  Airway Mallampati: I  TM Distance: >3 FB Neck ROM: Full    Dental  (+) Edentulous Upper, Edentulous Lower   Pulmonary COPD, Current Smoker,    breath sounds clear to auscultation       Cardiovascular hypertension, + CAD and + CABG  + dysrhythmias + Valvular Problems/Murmurs AS  Rhythm:Regular Rate:Normal  - s/p CABG, AVR  - Echo Left ventricle cavity is normal in size. Mild concentric hypertrophy of  the left ventricle. Abnormal septal wall motion due to post-operative  valve. Mildly depressed LV systolic function with visual EF 40-45%.  Doppler evidence of grade II (pseudonormal) diastolic dysfunction,  elevated LAP. Calculated EF 41%.  Bioprosthetic aortic valve with redo aortic valve replacement. Well seated  valve with no valvular or paravalvular leak. Mean PG of 10 mmHg likely  baseline normal for this valve. AVA 1.7 cm2, VmAx 2.1 m/sec.  Mild calcification of the mitral valve annulus and leaflet. Mildly  restricted mitral valve leaflets with no significant stenosis. Trace  mitral regurgitation.  Mild to moderate tricuspid regurgitation. Estimated pulmonary artery  systolic pressure is 37 mmHg.    Neuro/Psych PSYCHIATRIC DISORDERS Anxiety Depression negative neurological ROS     GI/Hepatic negative GI ROS, Neg liver ROS,   Endo/Other  negative endocrine ROS  Renal/GU negative Renal ROS     Musculoskeletal  (+) Arthritis ,   Abdominal Normal abdominal exam  (+)   Peds  Hematology negative hematology ROS (+)   Anesthesia Other Findings   Reproductive/Obstetrics                            Anesthesia Physical Anesthesia Plan  ASA: 3  Anesthesia Plan: MAC   Post-op Pain Management:     Induction: Intravenous  PONV Risk Score and Plan: 1 and Propofol infusion, Ondansetron and Midazolam  Airway Management Planned: Natural Airway and Simple Face Mask  Additional Equipment: None  Intra-op Plan:   Post-operative Plan:   Informed Consent: I have reviewed the patients History and Physical, chart, labs and discussed the procedure including the risks, benefits and alternatives for the proposed anesthesia with the patient or authorized representative who has indicated his/her understanding and acceptance.     Dental advisory given  Plan Discussed with: CRNA  Anesthesia Plan Comments:        Anesthesia Quick Evaluation

## 2021-08-17 NOTE — Anesthesia Procedure Notes (Signed)
Procedure Name: MAC Date/Time: 08/17/2021 7:28 AM Performed by: Carolan Clines, CRNA Pre-anesthesia Checklist: Patient identified, Emergency Drugs available, Suction available and Patient being monitored Patient Re-evaluated:Patient Re-evaluated prior to induction Oxygen Delivery Method: Simple face mask Dental Injury: Teeth and Oropharynx as per pre-operative assessment

## 2021-08-17 NOTE — Transfer of Care (Signed)
Immediate Anesthesia Transfer of Care Note  Patient: Henry Sheppard  Procedure(s) Performed: STERNAL WIRES REMOVAL (Chest)  Patient Location: PACU  Anesthesia Type:MAC  Level of Consciousness: awake, alert  and oriented  Airway & Oxygen Therapy: Patient Spontanous Breathing  Post-op Assessment: Report given to RN and Post -op Vital signs reviewed and stable  Post vital signs: Reviewed and stable  Last Vitals:  Vitals Value Taken Time  BP 113/65 08/17/21 0842  Temp    Pulse 57 08/17/21 0842  Resp 13 08/17/21 0842  SpO2 95 % 08/17/21 0842  Vitals shown include unvalidated device data.  Last Pain:  Vitals:   08/17/21 0559  TempSrc:   PainSc: 0-No pain      Patients Stated Pain Goal: 2 (76/14/70 9295)  Complications: No notable events documented.

## 2021-08-17 NOTE — Anesthesia Postprocedure Evaluation (Signed)
Anesthesia Post Note  Patient: Henry Sheppard  Procedure(s) Performed: STERNAL WIRES REMOVAL (Chest)     Patient location during evaluation: PACU Anesthesia Type: MAC Level of consciousness: awake and alert Pain management: pain level controlled Vital Signs Assessment: post-procedure vital signs reviewed and stable Respiratory status: spontaneous breathing, nonlabored ventilation, respiratory function stable and patient connected to nasal cannula oxygen Cardiovascular status: stable and blood pressure returned to baseline Postop Assessment: no apparent nausea or vomiting Anesthetic complications: no   No notable events documented.  Last Vitals:  Vitals:   08/17/21 0857 08/17/21 0912  BP: (!) 116/92 115/71  Pulse: 60 (!) 57  Resp: 19 13  Temp:  36.5 C  SpO2: 92% 92%    Last Pain:  Vitals:   08/17/21 0912  TempSrc:   PainSc: 0-No pain                 Effie Berkshire

## 2021-08-18 ENCOUNTER — Encounter (HOSPITAL_COMMUNITY): Payer: Self-pay | Admitting: Surgery

## 2021-08-21 ENCOUNTER — Other Ambulatory Visit: Payer: Self-pay | Admitting: Surgery

## 2021-08-21 ENCOUNTER — Encounter: Payer: Self-pay | Admitting: Physical Therapy

## 2021-08-21 ENCOUNTER — Ambulatory Visit: Payer: Medicare Other | Attending: Family Medicine | Admitting: Physical Therapy

## 2021-08-21 ENCOUNTER — Other Ambulatory Visit: Payer: Self-pay

## 2021-08-21 DIAGNOSIS — R2681 Unsteadiness on feet: Secondary | ICD-10-CM | POA: Diagnosis present

## 2021-08-21 DIAGNOSIS — R293 Abnormal posture: Secondary | ICD-10-CM | POA: Diagnosis not present

## 2021-08-21 DIAGNOSIS — T8189XD Other complications of procedures, not elsewhere classified, subsequent encounter: Secondary | ICD-10-CM

## 2021-08-21 DIAGNOSIS — M6281 Muscle weakness (generalized): Secondary | ICD-10-CM | POA: Diagnosis present

## 2021-08-22 NOTE — Therapy (Signed)
[facility] [address] [place], [place], [address] Phone: [phone]   Fax:  [phone]  Physical Therapy Evaluation  Patient Details  Name: Henry Sheppard MRN: [id number] Date of Birth: 1965/12/22 Referring Provider (PT): Ledora Bottcher, MD   Encounter Date: 08/21/2021   PT End of Session - 08/22/21 1512     Visit Number 1    Number of Visits 9    Date for PT Re-Evaluation 09/14/21    Authorization Type UHC Medicare/Medicaid    PT Start Time 0802    PT Stop Time 8546    PT Time Calculation (min) 47 min    Activity Tolerance Patient tolerated treatment well;No increased pain    Behavior During Therapy [facility] for tasks assessed/performed             Past Medical History:  Diagnosis Date   Anxiety    Arthritis    COPD (chronic obstructive pulmonary disease) ([place])    Depression    Dyspnea    H/O colonoscopy 2018   NORMAL   H/O echocardiogram 10/07/2018   MODERATE TO SEVERE  AORTIC VALVE STENOSIS..EF 40-50%   H/O endoscopy 2018   Heart murmur    Hodgkins lymphoma ([place])    in remission   Hyperlipidemia, group A    Hypertension, benign    Left bundle branch block    Narcotic dependence, in remission ([place])    Pneumonia     Past Surgical History:  Procedure Laterality Date   AORTIC VALVE REPLACEMENT     21 mm pericardial tissue valve 2011 in [place]   ASCENDING AORTIC ROOT REPLACEMENT N/A 07/29/2019   Procedure: ASCENDING AORTIC PORCINE ROOT REPLACEMENT USING MEDTRONIC FREESTYLE AORTIC ROOT SIZE 79mm and HEMOSHIELD STRAIGHT PLATINUM GRAFT SIZE 24;  Surgeon: [name], MD;  Location: [facility];  Service: Open Heart Surgery;  Laterality: N/A;   CARDIAC CATHETERIZATION     12/22/18: 40% proximal RCA, normal LM, LAD, LCX   CARDIAC VALVE REPLACEMENT     AVR pericardial tissue valve 2011 ([place])   collarbone surgery Right    CORONARY ARTERY BYPASS GRAFT     lymph angiogram     when pt.  was 17 yrs.  old   RIGHT/LEFT HEART CATH AND CORONARY ANGIOGRAPHY N/A 12/22/2018   Procedure: RIGHT/LEFT HEART CATH AND CORONARY ANGIOGRAPHY;  Surgeon: Nigel Mormon, MD;  Location: [facility] CV LAB;  Service: Cardiovascular;  Laterality: N/A;   SPLENECTOMY     STERNAL WIRES REMOVAL N/A 08/17/2021   Procedure: STERNAL WIRES REMOVAL;  Surgeon: [name], MD;  Location: [facility];  Service: Thoracic;  Laterality: N/A;   TEE WITHOUT CARDIOVERSION N/A 12/22/2018   Procedure: TRANSESOPHAGEAL ECHOCARDIOGRAM (TEE);  Surgeon: Nigel Mormon, MD;  Location: [facility] ENDOSCOPY;  Service: Cardiovascular;  Laterality: N/A;   TEE WITHOUT CARDIOVERSION N/A 07/29/2019   Procedure: TRANSESOPHAGEAL ECHOCARDIOGRAM (TEE);  Surgeon: [name], MD;  Location: [facility];  Service: Open Heart Surgery;  Laterality: N/A;   TOTAL THYROIDECTOMY      There were no vitals filed for this visit.        [facility] PT Assessment - 08/22/21 0001       Assessment   Medical Diagnosis dropped head syndrome    Referring Provider (PT) Ledora Bottcher, MD    Onset Date/Surgical Date 08/15/21    Hand Dominance Right    Prior Therapy has been seeing [facility] for past 2 months (is planning to discharge),  to start [facility]      Precautions   Precautions Fall    Precaution Comments No precautions per patient report      Balance Screen   Has the patient fallen in the past 6 months Yes   reports he was blacking out, stepping on L foot makes me fall, constantly stumbling, sometimes backwards   How many times? 5    Has the patient had a decrease in activity level because of a fear of falling?  No    Is the patient reluctant to leave their home because of a fear of falling?  No      Home Ecologist residence    Living Arrangements Spouse/significant other    Available Help at Discharge Family    Type of [place] to enter    Entrance Stairs-Number of Steps 3     Entrance Stairs-Rails Right    [place] One level    Additional Comments Has 2 big dogs      Prior Function   Level of Independence Independent    Leisure Enjoys being active, enjoys Community education officer; has a store      Observation/Other Assessments   Observations At rest, without UE support at chin, pt's chin is resting on chest.  Cervical spine musculature in extreme flexion      Posture/Postural Control   Posture/Postural Control Postural limitations    Postural Limitations Forward head;Posterior pelvic tilt    Posture Comments In sitting:  pt holds head in flexion and sidebending to R, lateral rotation to left.  At rest, chin is resting on chest.      ROM / Strength   AROM / PROM / Strength AROM;Strength      AROM   Overall AROM  Deficits    Overall AROM Comments From neutral position of goniometer, pt's resting neck flexion is 138 degrees (chin resting on chest).  With chin supported by UE/hand, pt is able to achieve near neutral position of neck (with compensation of trunk) and perform the following:  R rotation 40 degrees, L rotation 32 degrees.  He holds head in sidebend position approx 8 degrees to R.      Strength   Overall Strength Deficits    Overall Strength Comments Pt is unable to activate cervical extensors in sitting position against gravity.  When sitting and head/chin supported by hand, when he lets go, neck goes directly into extreme cervical flexion.  In supine with one pillow and PT support under head, pt is able to activate cervical extensor muscles in antigravity position, performing 10 reps 3 sec holds of cervical retraction.      Palpation   Palpation comment Tightness to palpation along upper traps, scalenes, SCM bilaterally; also tightness noted in supine with pecs bilaterally      Ambulation/Gait   Ambulation/Gait Yes    Gait velocity 8.25 sec = 3.98 ft/sec    Gait Comments Extreme forward posture with standing and gait.      High Level Balance    High Level Balance Comments Pt stands EO and EC on solid surface and on foam surface x 30 seconds, with increased sway and anterior lean with EC positions.                        Objective measurements completed on examination: See above findings.  PT Education - 08/22/21 1449     Education Details PT eval results and POC    Person(s) Educated Patient    Methods Explanation    Comprehension Verbalized understanding              PT Short Term Goals - 08/22/21 1527       PT SHORT TERM GOAL #1   Title STGs = LTGs               PT Long Term Goals - 08/22/21 1527       PT LONG TERM GOAL #1   Title Pt will be independent with HEP to address flexibility, strength, balance for improved overall functional mobility.  TARGET 09/14/2021    Time 4    Period Weeks    Status New      PT LONG TERM GOAL #2   Title Pt will be able to sustain hold of neck positioning in upright position without UE support x 30 seconds to demonstrate improved neck extensor strength.    Baseline unable at eval    Time 4    Period Weeks    Status New      PT LONG TERM GOAL #3   Title Pt will improve resting neck position to chin at least 1 inch from chest, for improved cervical spine strength.    Baseline chin resting on chest.    Time 4    Period Weeks      PT LONG TERM GOAL #4   Title Further balance testing to be performed and goals written as appropriate (DGI vs FGA?)      PT LONG TERM GOAL #5   Title Pt will verbalize understanding of fall prevention in home envrionment.    Time 4    Period Weeks    Status New                    Plan - 08/22/21 1514     Clinical Impression Statement Pt is a 55 year old male who presents to [facility] with dx of dropped head syndrome, which patient reports has been worsening since more recent heart surgery procedure in Sept. 2020.  He has had some previous PT at another clinic in town and desires to be seen  at [facility] to further address dropped head syndrome as well as balance/fall prevention (on script from MD).  Pt has had several falls in the past 6 months.  He presents with abnormal posture, decreased cervical muscle extensor strength, decreased flexibility, decreased balance, and history of falls.  He enjoys being active with collecting antiques.  He would benefit from skilled PT to address the above stated deficits to improve his overall functional mobility, ADL participation, and decreased fall risk.    Personal Factors and Comorbidities Comorbidity 3+    Comorbidities anxiety, arthritis, COPD, depression, Hodgkins Lymphoma, HTN, aortic root replacement 2020 with sternal wires removed 08/17/21, CABG/cardiac valve replacement 2011, collarbone surgery R    Examination-Activity Limitations Locomotion Level;Self Feeding;Carry;Dressing;Hygiene/Grooming;Stand    Examination-Participation Restrictions Occupation;Community Activity;Driving;Shop    Stability/Clinical Decision Making Evolving/Moderate complexity    Clinical Decision Making Moderate    Rehab Potential Good    PT Frequency 2x / week    PT Duration 4 weeks   including eval week   PT Treatment/Interventions ADLs/Self Care Home Management;Neuromuscular re-education;Balance training;Therapeutic exercise;Therapeutic activities;Functional mobility training;Patient/family education;Orthotic Fit/Training;Manual techniques;Passive range of motion    PT Next Visit Plan Initiate exercises for HEP for neck  extensor strenghtening and low back flexibility-supine neck retraction, scapular retraction, pelvic tilts; progress to semi-reclined positions for neck extension; anterior chest stretching; further balance assessment/exercises as appropriate.  May need more than the initial 4-wks based on progress with therapy    Consulted and Agree with Plan of Care Patient             Patient will benefit from skilled therapeutic intervention in order to  improve the following deficits and impairments:  Decreased range of motion, Decreased balance, Impaired flexibility, Postural dysfunction, Decreased strength  Visit Diagnosis: Abnormal posture  Muscle weakness (generalized)  Unsteadiness on feet     Problem List Patient Active Problem List   Diagnosis Date Noted   Normocytic normochromic anemia 03/14/2021   Leg swelling 01/13/2020   Encounter for removal of sutures 08/13/2019   S/P aortic valve replacement and aortoplasty 07/29/2019   Coronary artery disease involving native coronary artery of native heart without angina pectoris 01/13/2019   Leg edema 01/13/2019   Leg cramps 01/13/2019   Narcotic dependence, in remission ([place])    Hyperlipidemia, group A    Hypertension, benign    Hodgkins lymphoma ([place])    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 [place], [place], [address] Phone: [phone]   Fax:  [phone]  Name: LAKYN MANTIONE MRN: [id number] Date of Birth: 08-24-1966

## 2021-08-23 ENCOUNTER — Ambulatory Visit: Payer: Medicare Other | Admitting: Physical Therapy

## 2021-08-23 ENCOUNTER — Other Ambulatory Visit: Payer: Self-pay

## 2021-08-23 ENCOUNTER — Encounter: Payer: Self-pay | Admitting: Physical Therapy

## 2021-08-23 DIAGNOSIS — R293 Abnormal posture: Secondary | ICD-10-CM | POA: Diagnosis not present

## 2021-08-23 DIAGNOSIS — M6281 Muscle weakness (generalized): Secondary | ICD-10-CM

## 2021-08-23 NOTE — Therapy (Signed)
[place] [address] [place], [place], [address] Phone: [phone]   Fax:  [phone]  Physical Therapy Treatment  Patient Details  Name: Henry Sheppard MRN: [id number] Date of Birth: 10-May-1966 Referring Provider (PT): Ledora Bottcher, MD   Encounter Date: 08/23/2021   PT End of Session - 08/23/21 0845     Visit Number 2    Number of Visits 9    Date for PT Re-Evaluation 09/14/21    Authorization Type UHC Medicare/Medicaid    PT Start Time 0807    PT Stop Time 0845    PT Time Calculation (min) 38 min    Activity Tolerance Patient tolerated treatment well;No increased pain    Behavior During Therapy [facility] for tasks assessed/performed             Past Medical History:  Diagnosis Date   Anxiety    Arthritis    COPD (chronic obstructive pulmonary disease) ([place])    Depression    Dyspnea    H/O colonoscopy 2018   NORMAL   H/O echocardiogram 10/07/2018   MODERATE TO SEVERE  AORTIC VALVE STENOSIS..EF 40-50%   H/O endoscopy 2018   Heart murmur    Hodgkins lymphoma ([place])    in remission   Hyperlipidemia, group A    Hypertension, benign    Left bundle branch block    Narcotic dependence, in remission ([place])    Pneumonia     Past Surgical History:  Procedure Laterality Date   AORTIC VALVE REPLACEMENT     21 mm pericardial tissue valve 2011 [place]   ASCENDING AORTIC ROOT REPLACEMENT N/A 07/29/2019   Procedure: ASCENDING AORTIC PORCINE ROOT REPLACEMENT USING MEDTRONIC FREESTYLE AORTIC ROOT SIZE 35mm and HEMOSHIELD STRAIGHT PLATINUM GRAFT SIZE 24;  Surgeon: [name], MD;  Location: [facility];  Service: Open Heart Surgery;  Laterality: N/A;   CARDIAC CATHETERIZATION     12/22/18: 40% proximal RCA, normal LM, LAD, LCX   CARDIAC VALVE REPLACEMENT     AV[place] pericardial tissue valve 2011 ([place])   collarbone surgery Right    CORONARY ARTERY BYPASS GRAFT     lymph angiogram     when pt.  was 17 yrs. old    RIGHT/LEFT HEART CATH AND CORONARY ANGIOGRAPHY N/A 12/22/2018   Procedure: RIGHT/LEFT HEART CATH AND CORONARY ANGIOGRAPHY;  Surgeon: Nigel Mormon, MD;  Location: [place] CV LAB;  Service: Cardiovascular;  Laterality: N/A;   SPLENECTOMY     STERNAL WIRES REMOVAL N/A 08/17/2021   Procedure: STERNAL WIRES REMOVAL;  Surgeon: [name], MD;  Location: [facility];  Service: Thoracic;  Laterality: N/A;   TEE WITHOUT CARDIOVERSION N/A 12/22/2018   Procedure: TRANSESOPHAGEAL ECHOCARDIOGRAM (TEE);  Surgeon: Nigel Mormon, MD;  Location: [facility] ENDOSCOPY;  Service: Cardiovascular;  Laterality: N/A;   TEE WITHOUT CARDIOVERSION N/A 07/29/2019   Procedure: TRANSESOPHAGEAL ECHOCARDIOGRAM (TEE);  Surgeon: [name], MD;  Location: [place];  Service: Open Heart Surgery;  Laterality: N/A;   TOTAL THYROIDECTOMY      There were no vitals filed for this visit.   Subjective Assessment - 08/23/21 0809     Subjective No changes since the other day.  See the doctor tomorrow that took the chest wires out.  Had another problem with standing on the left leg.  Almost fell.  Supposed to get an MRI done (ordered from [place]), but haven't had it yet.    Patient Stated Goals Pt's goals for therapy  to no longer have to hold head up with my hand.    Currently in Pain? No/denies                     Access Code: [email or web address] URL: [email or web address] Date: 08/23/2021 Prepared by: Mady Haagensen   Performed the following exercises, and initiated as HEP Supine Pelvic Tilt - 1-2 x daily - 7 x weekly - 1-2 sets - 10 reps Lower Trunk Rotations - 1-2 x daily - 7 x weekly - 1 sets - 5 reps - 15-30 sec hold Supine Scapular Retraction - 1-2 x daily - 7 x weekly - 1-2 sets - 10 reps - 3 sec hold (performed 2 sets) Supine Cervical Retraction with Towel - 1-2 x daily - 7 x weekly - 1-2 sets - 10 reps - 3 sec hold (performed 2 sets)            [facility] Adult PT Treatment/Exercise - 08/23/21  0001       Exercises   Exercises Neck      Neck Exercises: Seated   Other Seated Exercise Seated edge of mat, with pt placing neck to approx 1 inch from chest, hold 15 seconds, then hold 30 seconds, x 2 reps with brief rest chin on chest between.      Neck Exercises: Supine   Other Supine Exercise PT performs passive stretch to neck rotation, R and L, 3 reps 15 sec.    Other Supine Exercise At modified/supine incline position (blue wedge plus pillow):  neck retraction x 10 reps, then neck flexion (coming chin towards chest, then focus on slowed eccentric contraction into neck neutral position at pillow), at least 10 reps.  Cues for improved attention to eccentric control when he goes into supine with pillows at home.                     PT Education - 08/23/21 1454     Education Details HEP initiated-see instructions; pt asks several times about using weights and machines; PT explains we are re-educating cervical extensor musculature progressively    Person(s) Educated Patient    Methods Explanation;Demonstration;Handout    Comprehension Verbalized understanding;Returned demonstration              PT Short Term Goals - 08/22/21 1527       PT SHORT TERM GOAL #1   Title STGs = LTGs               PT Long Term Goals - 08/22/21 1527       PT LONG TERM GOAL #1   Title Pt will be independent with HEP to address flexibility, strength, balance for improved overall functional mobility.  TARGET 09/14/2021    Time 4    Period Weeks    Status New      PT LONG TERM GOAL #2   Title Pt will be able to sustain hold of neck positioning in upright position without UE support x 30 seconds to demonstrate improved neck extensor strength.    Baseline unable at eval    Time 4    Period Weeks    Status New      PT LONG TERM GOAL #3   Title Pt will improve resting neck position to chin at least 1 inch from chest, for improved cervical spine strength.    Baseline chin  resting on chest.    Time 4    Period Weeks  PT LONG TERM GOAL #4   Title Further balance testing to be performed and goals written as appropriate (DGI vs FGA?)      PT LONG TERM GOAL #5   Title Pt will verbalize understanding of fall prevention in home envrionment.    Time 4    Period Weeks    Status New                   Plan - 08/23/21 1457     Clinical Impression Statement Initiated HEP this visit for supine lumbar/pelvic exercises as well as scapular retraction ans neck retraction.  Also worked progression into reclined supine position with neck exercises to engage extensors.  Also sitting isometric holds with neck approximately 1 inch from chest.  Educated pt in slowed eccentric control of head and neck when he lies down into supine position for exercises and for bed.    Personal Factors and Comorbidities Comorbidity 3+    Comorbidities anxiety, arthritis, COPD, depression, Hodgkins Lymphoma, HTN, aortic root replacement 2020 with sternal wires removed 08/17/21, CABG/cardiac valve replacement 2011, collarbone surgery R    Examination-Activity Limitations Locomotion Level;Self Feeding;Carry;Dressing;Hygiene/Grooming;Stand    Examination-Participation Restrictions Occupation;Community Activity;Driving;Shop    Stability/Clinical Decision Making Evolving/Moderate complexity    Rehab Potential Good    PT Frequency 2x / week    PT Duration 4 weeks   including eval week   PT Treatment/Interventions ADLs/Self Care Home Management;Neuromuscular re-education;Balance training;Therapeutic exercise;Therapeutic activities;Functional mobility training;Patient/family education;Orthotic Fit/Training;Manual techniques;Passive range of motion    PT Next Visit Plan Review HEP for neck extensor strenghtening and low back flexibility-supine neck retraction, scapular retraction, pelvic tilts; continue progression to semi-reclined positions for neck extension; anterior chest stretching; further  balance assessment/exercises as appropriate.  May need more than the initial 4-wks based on progress with therapy  Juliann Pulse has article with sample progression of exercises)    Consulted and Agree with Plan of Care Patient             Patient will benefit from skilled therapeutic intervention in order to improve the following deficits and impairments:  Decreased range of motion, Decreased balance, Impaired flexibility, Postural dysfunction, Decreased strength  Visit Diagnosis: Muscle weakness (generalized)  Abnormal posture     Problem List Patient Active Problem List   Diagnosis Date Noted   Normocytic normochromic anemia 03/14/2021   Leg swelling 01/13/2020   Encounter for removal of sutures 08/13/2019   S/P aortic valve replacement and aortoplasty 07/29/2019   Coronary artery disease involving native coronary artery of native heart without angina pectoris 01/13/2019   Leg edema 01/13/2019   Leg cramps 01/13/2019   Narcotic dependence, in remission ([place])    Hyperlipidemia, group A    Hypertension, benign    Hodgkins lymphoma ([place])    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   S/P AVR 12/21/2018   Exertional dyspnea 12/21/2018   Opioid dependence ([place]) 12/21/2018   Essential hypertension 12/21/2018   H/O echocardiogram 10/07/2018   Other secondary pulmonary hypertension ([place]) 07/30/2018   HFrEF (heart failure with reduced ejection fraction) ([place]) 07/30/2018   High cholesterol 07/30/2018   H/O endoscopy 11/18/2016   H/O colonoscopy 11/18/2016    Evea Sheek W., PT 08/23/2021, 3:04 PM  [place] [address] [place], [place], [address] Phone: [phone]   Fax:  [phone]  Name: TERELLE DOBLER MRN: [id number] Date of Birth: [birth year]

## 2021-08-23 NOTE — Patient Instructions (Signed)
Access Code: [email or web address] URL: [email or web address] Date: 08/23/2021 Prepared by: Mady Haagensen  Exercises Supine Pelvic Tilt - 1-2 x daily - 7 x weekly - 1-2 sets - 10 reps Lower Trunk Rotations - 1-2 x daily - 7 x weekly - 1 sets - 5 reps - 15-30 sec hold Supine Scapular Retraction - 1-2 x daily - 7 x weekly - 1-2 sets - 10 reps - 3 sec hold Supine Cervical Retraction with Towel - 1-2 x daily - 7 x weekly - 1-2 sets - 10 reps - 3 sec hold

## 2021-08-24 ENCOUNTER — Ambulatory Visit: Payer: Self-pay | Admitting: Surgery

## 2021-08-27 ENCOUNTER — Encounter: Payer: Self-pay | Admitting: Surgery

## 2021-08-27 ENCOUNTER — Ambulatory Visit
Admission: RE | Admit: 2021-08-27 | Discharge: 2021-08-27 | Disposition: A | Discharge status: Home / Self Care | Payer: Medicare Other | Source: Ambulatory Visit | Attending: Surgery | Admitting: Surgery

## 2021-08-27 ENCOUNTER — Ambulatory Visit (INDEPENDENT_AMBULATORY_CARE_PROVIDER_SITE_OTHER): Payer: Self-pay | Admitting: Surgery

## 2021-08-27 ENCOUNTER — Other Ambulatory Visit: Payer: Self-pay

## 2021-08-27 VITALS — BP 120/64 | HR 77 | Resp 20 | Wt 181.0 lb

## 2021-08-27 DIAGNOSIS — T8189XD Other complications of procedures, not elsewhere classified, subsequent encounter: Secondary | ICD-10-CM

## 2021-08-27 DIAGNOSIS — Z09 Encounter for follow-up examination after completed treatment for conditions other than malignant neoplasm: Secondary | ICD-10-CM

## 2021-08-27 NOTE — Progress Notes (Signed)
   HPI: Patient returns for routine postoperative follow-up having undergone removal of all of his sternal wires on 08/17/2021. He reports feeling well with no complaints.  Current Outpatient Medications  Medication Sig Dispense Refill   albuterol (VENTOLIN HFA) 108 (90 Base) MCG/ACT inhaler Inhale 2 puffs into the lungs every 6 (six) hours as needed (wheezing/shortness of breath). 18 g 6   amoxicillin-clavulanate (AUGMENTIN) 875-125 MG tablet Take 1 tablet by mouth 2 (two) times daily.     aspirin EC 81 MG tablet Take 1 tablet (81 mg total) by mouth daily. 90 tablet 3   atorvastatin (LIPITOR) 40 MG tablet Take 20 mg by mouth daily.     Budeson-Glycopyrrol-Formoterol (BREZTRI AEROSPHERE) 160-9-4.8 MCG/ACT AERO Inhale 2 puffs into the lungs in the morning and at bedtime. 5.9 g 0   diazepam (VALIUM) 5 MG tablet Take 5 mg by mouth 2 (two) times daily as needed for anxiety.     fluticasone (FLOVENT HFA) 110 MCG/ACT inhaler Inhale 2 puffs into the lungs 2 (two) times daily.     folic acid (FOLVITE) 751 MCG tablet Take 400 mcg by mouth daily.     hydrOXYzine (ATARAX/VISTARIL) 25 MG tablet Take 25 mg by mouth every 6 (six) hours as needed (pain).     ibuprofen (ADVIL) 800 MG tablet Take 800 mg by mouth every 8 (eight) hours as needed for moderate pain.     levothyroxine (SYNTHROID) 175 MCG tablet Take 175 mcg by mouth daily before breakfast.     losartan (COZAAR) 25 MG tablet Take 25 mg by mouth at bedtime.      meloxicam (MOBIC) 15 MG tablet Take 15 mg by mouth daily as needed for pain.     metoprolol tartrate (LOPRESSOR) 50 MG tablet Take 50 mg by mouth 2 (two) times daily.     MOVANTIK 25 MG TABS tablet Take 25 mg by mouth daily.     OXYGEN Inhale 3 L into the lungs at bedtime.     pantoprazole (PROTONIX) 40 MG tablet Take 40 mg by mouth 2 (two) times daily.     potassium chloride (MICRO-K) 10 MEQ CR capsule Take 10 mEq by mouth 2 (two) times daily.     predniSONE (DELTASONE) 20 MG tablet Take 60  mg by mouth daily.     sertraline (ZOLOFT) 100 MG tablet Take 200 mg by mouth at bedtime.     SUBOXONE 8-2 MG FILM Place 1 Film under the tongue in the morning, at noon, and at bedtime.  0   torsemide (DEMADEX) 20 MG tablet TAKE 2 TABLETS BY MOUTH EVERY DAY 180 tablet 0   No current facility-administered medications for this visit.    Physical Exam: BP 120/64 (BP Location: Left Arm, Patient Position: Sitting)   Pulse 77   Resp 20   Wt 181 lb (82.1 kg)   SpO2 95%   BMI 25.24 kg/m  He looks well. The chest incisions are all healing well.  There is mild ecchymosis.  The sternum feels stable.  Diagnostic Tests:  None today  Impression:  He is doing well 10 days following removal of his sternal wires.  I told him that he can return to normal activity as soon as the incisions are completely healed.  Plan:  He will return to see me if needed.   [name], MD [facility] [phone]

## 2021-08-28 ENCOUNTER — Encounter: Payer: Self-pay | Admitting: Physical Therapy

## 2021-08-28 ENCOUNTER — Ambulatory Visit: Payer: Medicare Other | Admitting: Physical Therapy

## 2021-08-28 DIAGNOSIS — R293 Abnormal posture: Secondary | ICD-10-CM

## 2021-08-28 DIAGNOSIS — M6281 Muscle weakness (generalized): Secondary | ICD-10-CM

## 2021-08-28 DIAGNOSIS — R2681 Unsteadiness on feet: Secondary | ICD-10-CM

## 2021-08-28 NOTE — Therapy (Signed)
[place] [address] [place], [place], [address] Phone: [phone]   Fax:  [phone]  Physical Therapy Treatment  Patient Details  Name: Henry Sheppard MRN: [id number] Date of Birth: 01/29/1966 Referring Provider (PT): Ledora Bottcher, MD   Encounter Date: 08/28/2021   PT End of Session - 08/28/21 0724     Visit Number 3    Number of Visits 9    Date for PT Re-Evaluation 09/14/21    Authorization Type UHC Medicare/Medicaid    PT Start Time 0719    PT Stop Time 0800    PT Time Calculation (min) 41 min    Activity Tolerance Patient tolerated treatment well;No increased pain    Behavior During Therapy [facility] for tasks assessed/performed             Past Medical History:  Diagnosis Date   Anxiety    Arthritis    COPD (chronic obstructive pulmonary disease) ([place])    Depression    Dyspnea    H/O colonoscopy 2018   NORMAL   H/O echocardiogram 10/07/2018   MODERATE TO SEVERE  AORTIC VALVE STENOSIS..EF 40-50%   H/O endoscopy 2018   Heart murmur    Hodgkins lymphoma ([place])    in remission   Hyperlipidemia, group A    Hypertension, benign    Left bundle branch block    Narcotic dependence, in remission ([place])    Pneumonia     Past Surgical History:  Procedure Laterality Date   AORTIC VALVE REPLACEMENT     21 mm pericardial tissue valve 2011 [place]   ASCENDING AORTIC ROOT REPLACEMENT N/A 07/29/2019   Procedure: ASCENDING AORTIC PORCINE ROOT REPLACEMENT USING MEDTRONIC FREESTYLE AORTIC ROOT SIZE 13mm and HEMOSHIELD STRAIGHT PLATINUM GRAFT SIZE 24;  Surgeon: [name], MD;  Location: [facility];  Service: Open Heart Surgery;  Laterality: N/A;   CARDIAC CATHETERIZATION     12/22/18: 40% proximal RCA, normal LM, LAD, LCX   CARDIAC VALVE REPLACEMENT     AV[place] pericardial tissue valve 2011 ([place])   collarbone surgery Right    CORONARY ARTERY BYPASS GRAFT     lymph angiogram     when pt.  was 17 yrs.  old   RIGHT/LEFT HEART CATH AND CORONARY ANGIOGRAPHY N/A 12/22/2018   Procedure: RIGHT/LEFT HEART CATH AND CORONARY ANGIOGRAPHY;  Surgeon: Nigel Mormon, MD;  Location: [place] CV LAB;  Service: Cardiovascular;  Laterality: N/A;   SPLENECTOMY     STERNAL WIRES REMOVAL N/A 08/17/2021   Procedure: STERNAL WIRES REMOVAL;  Surgeon: [name], MD;  Location: [place];  Service: Thoracic;  Laterality: N/A;   TEE WITHOUT CARDIOVERSION N/A 12/22/2018   Procedure: TRANSESOPHAGEAL ECHOCARDIOGRAM (TEE);  Surgeon: Nigel Mormon, MD;  Location: [facility] ENDOSCOPY;  Service: Cardiovascular;  Laterality: N/A;   TEE WITHOUT CARDIOVERSION N/A 07/29/2019   Procedure: TRANSESOPHAGEAL ECHOCARDIOGRAM (TEE);  Surgeon: [name], MD;  Location: [place];  Service: Open Heart Surgery;  Laterality: N/A;   TOTAL THYROIDECTOMY      There were no vitals filed for this visit.   Subjective Assessment - 08/28/21 0722     Subjective Having increased pain in LE's from proceedure in June where his Sciatic nerve was "burned". Feels it started after doing the bridges at home. Advised pt to hold on those for now.    Patient Stated Goals Pt's goals for therapy to no longer have to hold head up with my hand.  Currently in Pain? Yes    Pain Score 10-Worst pain ever    Pain Location Leg    Pain Orientation Left    Pain Descriptors / Indicators Sharp;Burning    Pain Type Acute pain    Pain Frequency Constant    Aggravating Factors  certain movements    Pain Relieving Factors stretching sometimes, "nothing really"                    [facility] Adult PT Treatment/Exercise - 08/28/21 0725       Transfers   Transfers Sit to Stand;Stand to Sit    Sit to Stand 6: Modified independent (Device/Increase time)    Stand to Sit 6: Modified independent (Device/Increase time)      Ambulation/Gait   Ambulation/Gait Yes    Ambulation/Gait Assistance 5: Supervision    Ambulation Distance (Feet) --   around clinic  with session   Assistive device None    Gait Pattern Step-through pattern    Ambulation Surface Level;Indoor      Exercises   Exercises Other Exercises    Other Exercises  lying on mat with blue wedge/2 pillows: cervical retractions into pillow for 5 sec holds x 10 reps. then lateral flexion isometric holds with PTA resistance for 5 sec's x 10 reps each side; scapular retractions for 5 sec holds x 20 reps; with cervical retraction- alternating UE raises for 10 reps with rest beak between each rep to "reset" cervical retraction hold; with yellow band resistance- rows, then shoulder extensions for 2 sets of 10 reps concurrent with cervical retraction, cues to keep arms close to body to "stay in the tunnel" at this time until ensure pt is cleared of sternal precautions. Ended with active cervical extension/flexion with pt keeping head in contact with pillow for 10 rep each way. then cervical rotation with emphasis on maintaining cervical extension (head on pillow) for 10 reps each way. then active lateral flexion with emphasis on keeping forward gaze/cervical neutral position for 10 reps each side, assist needed iniitally with pt progressing to no assistance needed for last few reps for neutral position with movements                       PT Short Term Goals - 08/22/21 1527       PT SHORT TERM GOAL #1   Title STGs = LTGs               PT Long Term Goals - 08/22/21 1527       PT LONG TERM GOAL #1   Title Pt will be independent with HEP to address flexibility, strength, balance for improved overall functional mobility.  TARGET 09/14/2021    Time 4    Period Weeks    Status New      PT LONG TERM GOAL #2   Title Pt will be able to sustain hold of neck positioning in upright position without UE support x 30 seconds to demonstrate improved neck extensor strength.    Baseline unable at eval    Time 4    Period Weeks    Status New      PT LONG TERM GOAL #3   Title Pt will  improve resting neck position to chin at least 1 inch from chest, for improved cervical spine strength.    Baseline chin resting on chest.    Time 4    Period Weeks      PT LONG  TERM GOAL #4   Title Further balance testing to be performed and goals written as appropriate (DGI vs FGA?)      PT LONG TERM GOAL #5   Title Pt will verbalize understanding of fall prevention in home envrionment.    Time 4    Period Weeks    Status New                   Plan - 08/28/21 0724     Clinical Impression Statement Today's skilled session continued to focus cervical and shoulder/scapular strengthening with no issues noted or reported by patient. The pt is making progress toward goals and should benefit from continued PT to progress toward unmet goals.    Personal Factors and Comorbidities Comorbidity 3+    Comorbidities anxiety, arthritis, COPD, depression, Hodgkins Lymphoma, HTN, aortic root replacement 2020 with sternal wires removed 08/17/21, CABG/cardiac valve replacement 2011, collarbone surgery R    Examination-Activity Limitations Locomotion Level;Self Feeding;Carry;Dressing;Hygiene/Grooming;Stand    Examination-Participation Restrictions Occupation;Community Activity;Driving;Shop    Stability/Clinical Decision Making Evolving/Moderate complexity    Rehab Potential Good    PT Frequency 2x / week    PT Duration 4 weeks   including eval week   PT Treatment/Interventions ADLs/Self Care Home Management;Neuromuscular re-education;Balance training;Therapeutic exercise;Therapeutic activities;Functional mobility training;Patient/family education;Orthotic Fit/Training;Manual techniques;Passive range of motion    PT Next Visit Plan Review HEP for neck extensor strenghtening and low back flexibility-supine neck retraction, scapular retraction, pelvic tilts; continue progression to semi-reclined positions for neck extension; anterior chest stretching; further balance assessment/exercises as  appropriate.  May need more than the initial 4-wks based on progress with therapy  Juliann Pulse has article with sample progression of exercises)    Consulted and Agree with Plan of Care Patient             Patient will benefit from skilled therapeutic intervention in order to improve the following deficits and impairments:  Decreased range of motion, Decreased balance, Impaired flexibility, Postural dysfunction, Decreased strength  Visit Diagnosis: Muscle weakness (generalized)  Abnormal posture  Unsteadiness on feet     Problem List Patient Active Problem List   Diagnosis Date Noted   Normocytic normochromic anemia 03/14/2021   Leg swelling 01/13/2020   Encounter for removal of sutures 08/13/2019   S/P aortic valve replacement and aortoplasty 07/29/2019   Coronary artery disease involving native coronary artery of native heart without angina pectoris 01/13/2019   Leg edema 01/13/2019   Leg cramps 01/13/2019   Narcotic dependence, in remission ([place])    Hyperlipidemia, group A    Hypertension, benign    Hodgkins lymphoma ([place])    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   S/P AVR 12/21/2018   Exertional dyspnea 12/21/2018   Opioid dependence ([place]) 12/21/2018   Essential hypertension 12/21/2018   H/O echocardiogram 10/07/2018   Other secondary pulmonary hypertension ([place]) 07/30/2018   HFrEF (heart failure with reduced ejection fraction) ([place]) 07/30/2018   High cholesterol 07/30/2018   H/O endoscopy 11/18/2016   H/O colonoscopy 11/18/2016    Willow Ora, PTA, [facility] [address] [place], [place] [address] [phone] 08/28/21, 1:03 PM   Name: VIR WHETSTINE MRN: [id number] Date of Birth: 04-13-1966

## 2021-08-30 ENCOUNTER — Ambulatory Visit: Payer: Medicare Other | Admitting: Physical Therapy

## 2021-08-30 ENCOUNTER — Other Ambulatory Visit: Payer: Self-pay

## 2021-08-30 DIAGNOSIS — R293 Abnormal posture: Secondary | ICD-10-CM | POA: Diagnosis not present

## 2021-08-30 DIAGNOSIS — M6281 Muscle weakness (generalized): Secondary | ICD-10-CM

## 2021-08-30 DIAGNOSIS — R2681 Unsteadiness on feet: Secondary | ICD-10-CM

## 2021-09-03 NOTE — Therapy (Signed)
[facility] [address] [place], [place], [address] Phone: [phone]   Fax:  [phone]  Physical Therapy Treatment  Patient Details  Name: Henry Sheppard MRN: [id number] Date of Birth: 11/22/1965 Referring Provider (PT): Ledora Bottcher, MD   Encounter Date: 08/30/2021   08/30/21 0809  PT Visits / Re-Eval  Visit Number 4  Number of Visits 9  Date for PT Re-Evaluation 09/14/21  Authorization  Authorization Type UHC Medicare/Medicaid  PT Time Calculation  PT Start Time 0804  PT Stop Time 0845  PT Time Calculation (min) 41 min  PT - End of Session  Activity Tolerance Patient tolerated treatment well;No increased pain  Behavior During Therapy [facility] for tasks assessed/performed     Past Medical History:  Diagnosis Date   Anxiety    Arthritis    COPD (chronic obstructive pulmonary disease) ([place])    Depression    Dyspnea    H/O colonoscopy 2018   NORMAL   H/O echocardiogram 10/07/2018   MODERATE TO SEVERE  AORTIC VALVE STENOSIS..EF 40-50%   H/O endoscopy 2018   Heart murmur    Hodgkins lymphoma ([place])    in remission   Hyperlipidemia, group A    Hypertension, benign    Left bundle branch block    Narcotic dependence, in remission ([place])    Pneumonia     Past Surgical History:  Procedure Laterality Date   AORTIC VALVE REPLACEMENT     21 mm pericardial tissue valve 2011 [place]   ASCENDING AORTIC ROOT REPLACEMENT N/A 07/29/2019   Procedure: ASCENDING AORTIC PORCINE ROOT REPLACEMENT USING MEDTRONIC FREESTYLE AORTIC ROOT SIZE 8mm and HEMOSHIELD STRAIGHT PLATINUM GRAFT SIZE 24;  Surgeon: [name], MD;  Location: [place];  Service: Open Heart Surgery;  Laterality: N/A;   CARDIAC CATHETERIZATION     12/22/18: 40% proximal RCA, normal LM, LAD, LCX   CARDIAC VALVE REPLACEMENT     AV[place] pericardial tissue valve 2011 ([place])   collarbone surgery Right    CORONARY ARTERY BYPASS GRAFT     lymph angiogram      when pt.  was 17 yrs. old   RIGHT/LEFT HEART CATH AND CORONARY ANGIOGRAPHY N/A 12/22/2018   Procedure: RIGHT/LEFT HEART CATH AND CORONARY ANGIOGRAPHY;  Surgeon: Nigel Mormon, MD;  Location: [facility];  Service: Cardiovascular;  Laterality: N/A;   SPLENECTOMY     STERNAL WIRES REMOVAL N/A 08/17/2021   Procedure: STERNAL WIRES REMOVAL;  Surgeon: [name], MD;  Location: [facility];  Service: Thoracic;  Laterality: N/A;   TEE WITHOUT CARDIOVERSION N/A 12/22/2018   Procedure: TRANSESOPHAGEAL ECHOCARDIOGRAM (TEE);  Surgeon: Nigel Mormon, MD;  Location: [facility] ENDOSCOPY;  Service: Cardiovascular;  Laterality: N/A;   TEE WITHOUT CARDIOVERSION N/A 07/29/2019   Procedure: TRANSESOPHAGEAL ECHOCARDIOGRAM (TEE);  Surgeon: [name], MD;  Location: [facility];  Service: Open Heart Surgery;  Laterality: N/A;   TOTAL THYROIDECTOMY      There were no vitals filed for this visit.    08/30/21 0807  Symptoms/Limitations  Subjective No new complaints. See's Vascular tomorrow about the bruising/pain on left>right LE's from procedure he had over summer.  Patient Stated Goals Pt's goals for therapy to no longer have to hold head up with my hand.  Pain Assessment  Currently in Pain? Yes  Pain Score 10  Pain Location Leg  Pain Orientation Left  Pain Descriptors / Indicators Sharp;Burning  Pain Type Acute pain  Pain Onset More than  a month ago  Pain Frequency Constant  Aggravating Factors  certain movements  Pain Relieving Factors "nothing really", stretching sometimes       08/30/21 0810  Transfers  Transfers Sit to Stand;Stand to Sit  Sit to Stand 6: Modified independent (Device/Increase time)  Stand to Sit 6: Modified independent (Device/Increase time)  Ambulation/Gait  Ambulation/Gait Yes  Ambulation/Gait Assistance 5: Supervision  Ambulation Distance (Feet)  (around clinic with session)  Assistive device None  Gait Pattern Step-through pattern  Ambulation Surface  Level;Indoor  Exercises  Exercises Other Exercises  Other Exercises  lying on mat table with blue wedge with 2 pillows and bolster under legs: with assist to keep cervical spine/head in neutral cervical retraction for 5 sec holds x 10 reps, 2 sets. isometric cervical flexion 5 sec hold x 10 reps. isometric cervical lateral flexion 5 sec holds 2 sets of 10 bil sides, PTA helping to maintain neutral while providing resistance. cues for chin tucks with all of the isometric ex's. then with cues/assist for chin tuck with cervical spine in neutral for scapular retraction for 2 sets of 10 reps; with yellow band resistance: bil rows with 5 sec holds x 10 reps with cues to keep elbows bent and tucked to sides, then bil shoulder extension from ~90 degree of flexion (bringing hands down toward hips) for 10 reps with cues to keep elbows straight and tucked tight.        PT Short Term Goals - 08/22/21 1527       PT SHORT TERM GOAL #1   Title STGs = LTGs               PT Long Term Goals - 08/22/21 1527       PT LONG TERM GOAL #1   Title Pt will be independent with HEP to address flexibility, strength, balance for improved overall functional mobility.  TARGET 09/14/2021    Time 4    Period Weeks    Status New      PT LONG TERM GOAL #2   Title Pt will be able to sustain hold of neck positioning in upright position without UE support x 30 seconds to demonstrate improved neck extensor strength.    Baseline unable at eval    Time 4    Period Weeks    Status New      PT LONG TERM GOAL #3   Title Pt will improve resting neck position to chin at least 1 inch from chest, for improved cervical spine strength.    Baseline chin resting on chest.    Time 4    Period Weeks      PT LONG TERM GOAL #4   Title Further balance testing to be performed and goals written as appropriate (DGI vs FGA?)      PT LONG TERM GOAL #5   Title Pt will verbalize understanding of fall prevention in home envrionment.     Time 4    Period Weeks    Status New              08/30/21 0809  Plan  Clinical Impression Statement Today's skilled session continued to focus on cervical/scapular strengthening. Pt reports feeling muscle activation with ex's performed. No issues reported or noted in session. The pt should benefit from continued PT to progress toward unmet goals  Personal Factors and Comorbidities Comorbidity 3+  Comorbidities anxiety, arthritis, COPD, depression, Hodgkins Lymphoma, HTN, aortic root replacement 2020 with sternal wires removed 08/17/21, CABG/cardiac  valve replacement 2011, collarbone surgery R  Examination-Activity Limitations Locomotion Level;Self Feeding;Carry;Dressing;Hygiene/Grooming;Stand  Examination-Participation Restrictions Occupation;Community Activity;Driving;Shop  Pt will benefit from skilled therapeutic intervention in order to improve on the following deficits Decreased range of motion;Decreased balance;Impaired flexibility;Postural dysfunction;Decreased strength  Stability/Clinical Decision Making Evolving/Moderate complexity  Rehab Potential Good  PT Frequency 2x / week  PT Duration 4 weeks (including eval week)  PT Treatment/Interventions ADLs/Self Care Home Management;Neuromuscular re-education;Balance training;Therapeutic exercise;Therapeutic activities;Functional mobility training;Patient/family education;Orthotic Fit/Training;Manual techniques;Passive range of motion  PT Next Visit Plan Review HEP for neck extensor strenghtening and low back flexibility-supine neck retraction, scapular retraction, pelvic tilts; continue progression to semi-reclined positions for neck extension; anterior chest stretching; further balance assessment/exercises as appropriate.  May need more than the initial 4-wks based on progress with therapy  Juliann Pulse has article with sample progression of exercises)  Consulted and Agree with Plan of Care Patient          Patient will benefit  from skilled therapeutic intervention in order to improve the following deficits and impairments:  Decreased range of motion, Decreased balance, Impaired flexibility, Postural dysfunction, Decreased strength  Visit Diagnosis: Muscle weakness (generalized)  Abnormal posture  Unsteadiness on feet     Problem List Patient Active Problem List   Diagnosis Date Noted   Normocytic normochromic anemia 03/14/2021   Leg swelling 01/13/2020   Encounter for removal of sutures 08/13/2019   S/P aortic valve replacement and aortoplasty 07/29/2019   Coronary artery disease involving native coronary artery of native heart without angina pectoris 01/13/2019   Leg edema 01/13/2019   Leg cramps 01/13/2019   Narcotic dependence, in remission ([place])    Hyperlipidemia, group A    Hypertension, benign    Hodgkins lymphoma ([place])    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   S/P AVR 12/21/2018   Exertional dyspnea 12/21/2018   Opioid dependence ([place]) 12/21/2018   Essential hypertension 12/21/2018   H/O echocardiogram 10/07/2018   Other secondary pulmonary hypertension ([place]) 07/30/2018   HFrEF (heart failure with reduced ejection fraction) ([place]) 07/30/2018   High cholesterol 07/30/2018   H/O endoscopy 11/18/2016   H/O colonoscopy 11/18/2016    Willow Ora, PTA 09/03/2021, 3:50 PM  [facility] [address] [place], [place], [address] Phone: [phone]   Fax:  [phone]  Name: MACLIN GUERRETTE MRN: [id number] Date of Birth: 01/15/1966

## 2021-09-04 ENCOUNTER — Encounter: Payer: Self-pay | Admitting: Physical Therapy

## 2021-09-04 ENCOUNTER — Other Ambulatory Visit: Payer: Self-pay

## 2021-09-04 ENCOUNTER — Ambulatory Visit: Payer: Medicare Other | Admitting: Physical Therapy

## 2021-09-04 DIAGNOSIS — R293 Abnormal posture: Secondary | ICD-10-CM

## 2021-09-04 DIAGNOSIS — M6281 Muscle weakness (generalized): Secondary | ICD-10-CM

## 2021-09-04 DIAGNOSIS — R2681 Unsteadiness on feet: Secondary | ICD-10-CM

## 2021-09-04 NOTE — Therapy (Signed)
[facility] [address] [place], [place], [address] Phone: [phone]   Fax:  [phone]  Physical Therapy Treatment  Patient Details  Name: Henry Sheppard MRN: [id number] Date of Birth: 1965/11/24 Referring Provider (PT): Ledora Bottcher, MD   Encounter Date: 09/04/2021   PT End of Session - 09/04/21 0812     Visit Number 5    Number of Visits 9    Date for PT Re-Evaluation 09/14/21    Authorization Type UHC Medicare/Medicaid    PT Start Time 0805    PT Stop Time 0845    PT Time Calculation (min) 40 min    Activity Tolerance Patient tolerated treatment well;No increased pain    Behavior During Therapy [facility] for tasks assessed/performed             Past Medical History:  Diagnosis Date   Anxiety    Arthritis    COPD (chronic obstructive pulmonary disease) ([place])    Depression    Dyspnea    H/O colonoscopy 2018   NORMAL   H/O echocardiogram 10/07/2018   MODERATE TO SEVERE  AORTIC VALVE STENOSIS..EF 40-50%   H/O endoscopy 2018   Heart murmur    Hodgkins lymphoma ([place])    in remission   Hyperlipidemia, group A    Hypertension, benign    Left bundle branch block    Narcotic dependence, in remission ([place])    Pneumonia     Past Surgical History:  Procedure Laterality Date   AORTIC VALVE REPLACEMENT     21 mm pericardial tissue valve 2011 in [place]   ASCENDING AORTIC ROOT REPLACEMENT N/A 07/29/2019   Procedure: ASCENDING AORTIC PORCINE ROOT REPLACEMENT USING MEDTRONIC FREESTYLE AORTIC ROOT SIZE 45mm and HEMOSHIELD STRAIGHT PLATINUM GRAFT SIZE 24;  Surgeon: [name], MD;  Location: [facility];  Service: Open Heart Surgery;  Laterality: N/A;   CARDIAC CATHETERIZATION     12/22/18: 40% proximal RCA, normal LM, LAD, LCX   CARDIAC VALVE REPLACEMENT     AV[place] pericardial tissue valve 2011 ([place])   collarbone surgery Right    CORONARY ARTERY BYPASS GRAFT     lymph angiogram     when pt.  was 17 yrs.  old   RIGHT/LEFT HEART CATH AND CORONARY ANGIOGRAPHY N/A 12/22/2018   Procedure: RIGHT/LEFT HEART CATH AND CORONARY ANGIOGRAPHY;  Surgeon: Nigel Mormon, MD;  Location: [facility];  Service: Cardiovascular;  Laterality: N/A;   SPLENECTOMY     STERNAL WIRES REMOVAL N/A 08/17/2021   Procedure: STERNAL WIRES REMOVAL;  Surgeon: [name], MD;  Location: [place] [facility];  Service: Thoracic;  Laterality: N/A;   TEE WITHOUT CARDIOVERSION N/A 12/22/2018   Procedure: TRANSESOPHAGEAL ECHOCARDIOGRAM (TEE);  Surgeon: Nigel Mormon, MD;  Location: [facility] ENDOSCOPY;  Service: Cardiovascular;  Laterality: N/A;   TEE WITHOUT CARDIOVERSION N/A 07/29/2019   Procedure: TRANSESOPHAGEAL ECHOCARDIOGRAM (TEE);  Surgeon: [name], MD;  Location: [facility];  Service: Open Heart Surgery;  Laterality: N/A;   TOTAL THYROIDECTOMY      There were no vitals filed for this visit.   Subjective Assessment - 09/04/21 0808     Subjective Having increased sciatica pain on left side and bruising in left leg. Has called Vascular. They moved his appt to an earlier date, then cancelled it, now he goes on 09/17/21. Almost cancelled today because of pain.    Currently in Pain? Yes    Pain Score 10-Worst pain ever  Pain Location Back    Pain Orientation Left    Pain Descriptors / Indicators Sharp;Burning    Pain Type Acute pain;Neuropathic pain    Pain Radiating Towards all the way down left leg    Pain Onset More than a month ago    Pain Frequency Constant    Aggravating Factors  certain movements    Pain Relieving Factors "nothing really", stretching sometimes                    [facility] Adult PT Treatment/Exercise - 09/04/21 0812       Transfers   Transfers Sit to Stand;Stand to Sit    Sit to Stand 6: Modified independent (Device/Increase time)    Stand to Sit 6: Modified independent (Device/Increase time)      Ambulation/Gait   Ambulation/Gait Yes    Ambulation/Gait Assistance 5: Supervision     Ambulation Distance (Feet) --   around clinic with session   Assistive device None    Gait Pattern Step-through pattern    Ambulation Surface Level;Indoor      Self-Care   Self-Care Other Self-Care Comments    Other Self-Care Comments  use of tennis ball for trigger point pressure/release at sciatic nerve in lying down.      Exercises   Exercises Other Exercises    Other Exercises  lying on mat table with blue wedge with 2 pillows and bolster under legs: with assist to keep cervical spine/head in neutral cervical retraction for 5 sec holds x 10 reps, 2 sets. isometric cervical flexion 5 sec hold x 10 reps. isometric cervical lateral flexion 5 sec holds 2 sets of 10 bil sides, PTA helping to maintain neutral while providing resistance. cues for chin tucks with all of the isometric ex's. then with cues/assist for chin tuck with cervical spine in neutral for scapular retraction for 2 sets of 10 reps; with red band resistance: bil rows with 5 sec holds x 10 reps with cues to keep elbows bent and tucked to sides, then bil shoulder extension from ~90 degree of flexion (bringing hands down toward hips) for 10 reps with cues to keep elbows straight and tucked tight. Without bolster under legs: passive left hamstring and piriformis stretching for 30 sec's x 3 reps each for decreased pain.                   PT Short Term Goals - 08/22/21 1527       PT SHORT TERM GOAL #1   Title STGs = LTGs               PT Long Term Goals - 08/22/21 1527       PT LONG TERM GOAL #1   Title Pt will be independent with HEP to address flexibility, strength, balance for improved overall functional mobility.  TARGET 09/14/2021    Time 4    Period Weeks    Status New      PT LONG TERM GOAL #2   Title Pt will be able to sustain hold of neck positioning in upright position without UE support x 30 seconds to demonstrate improved neck extensor strength.    Baseline unable at eval    Time 4    Period  Weeks    Status New      PT LONG TERM GOAL #3   Title Pt will improve resting neck position to chin at least 1 inch from chest, for improved cervical spine strength.  Baseline chin resting on chest.    Time 4    Period Weeks      PT LONG TERM GOAL #4   Title Further balance testing to be performed and goals written as appropriate (DGI vs FGA?)      PT LONG TERM GOAL #5   Title Pt will verbalize understanding of fall prevention in home envrionment.    Time 4    Period Weeks    Status New                   Plan - 09/04/21 7591     Clinical Impression Statement Today's skilled session continued to focus on ex's for decreased sciatic pain and for cervical/shoulder strengthening. Pt did report decreased pain at end of session. No other issues noted or reported in session. The pt is making steady progress toward goals and should benefit from continued PT to progress toward unmet goals.    Personal Factors and Comorbidities Comorbidity 3+    Comorbidities anxiety, arthritis, COPD, depression, Hodgkins Lymphoma, HTN, aortic root replacement 2020 with sternal wires removed 08/17/21, CABG/cardiac valve replacement 2011, collarbone surgery R    Examination-Activity Limitations Locomotion Level;Self Feeding;Carry;Dressing;Hygiene/Grooming;Stand    Examination-Participation Restrictions Occupation;Community Activity;Driving;Shop    Stability/Clinical Decision Making Evolving/Moderate complexity    Rehab Potential Good    PT Frequency 2x / week    PT Duration 4 weeks   including eval week   PT Treatment/Interventions ADLs/Self Care Home Management;Neuromuscular re-education;Balance training;Therapeutic exercise;Therapeutic activities;Functional mobility training;Patient/family education;Orthotic Fit/Training;Manual techniques;Passive range of motion    PT Next Visit Plan Review HEP for neck extensor strenghtening and low back flexibility-supine neck retraction, scapular retraction, pelvic  tilts; continue progression to semi-reclined positions for neck extension; anterior chest stretching; further balance assessment/exercises as appropriate.  May need more than the initial 4-wks based on progress with therapy  Juliann Pulse has article with sample progression of exercises)    Consulted and Agree with Plan of Care Patient             Patient will benefit from skilled therapeutic intervention in order to improve the following deficits and impairments:  Decreased range of motion, Decreased balance, Impaired flexibility, Postural dysfunction, Decreased strength  Visit Diagnosis: Muscle weakness (generalized)  Abnormal posture  Unsteadiness on feet     Problem List Patient Active Problem List   Diagnosis Date Noted   Normocytic normochromic anemia 03/14/2021   Leg swelling 01/13/2020   Encounter for removal of sutures 08/13/2019   S/P aortic valve replacement and aortoplasty 07/29/2019   Coronary artery disease involving native coronary artery of native heart without angina pectoris 01/13/2019   Leg edema 01/13/2019   Leg cramps 01/13/2019   Narcotic dependence, in remission ([place])    Hyperlipidemia, group A    Hypertension, benign    Hodgkins lymphoma ([place])    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   S/P AVR 12/21/2018   Exertional dyspnea 12/21/2018   Opioid dependence ([place]) 12/21/2018   Essential hypertension 12/21/2018   H/O echocardiogram 10/07/2018   Other secondary pulmonary hypertension ([place]) 07/30/2018   HFrEF (heart failure with reduced ejection fraction) ([place]) 07/30/2018   High cholesterol 07/30/2018   H/O endoscopy 11/18/2016   H/O colonoscopy 11/18/2016    Willow Ora, PTA, [facility] [address], [place], [place] [address] [phone] 09/04/21, 11:57 PM   Name: JAILON SCHAIBLE MRN: [id number] Date of Birth: 1966/07/17

## 2021-09-07 ENCOUNTER — Ambulatory Visit: Payer: Medicare Other | Admitting: Physical Therapy

## 2021-09-07 ENCOUNTER — Other Ambulatory Visit: Payer: Self-pay

## 2021-09-07 DIAGNOSIS — M6281 Muscle weakness (generalized): Secondary | ICD-10-CM

## 2021-09-07 DIAGNOSIS — R293 Abnormal posture: Secondary | ICD-10-CM | POA: Diagnosis not present

## 2021-09-07 DIAGNOSIS — R2681 Unsteadiness on feet: Secondary | ICD-10-CM

## 2021-09-07 NOTE — Therapy (Signed)
[facility] [address] [place], [place], [address] Phone: [phone]   Fax:  [phone]  Physical Therapy Treatment  Patient Details  Name: Henry Sheppard MRN: [id number] Date of Birth: 1965-12-29 Referring Provider (PT): Ledora Bottcher, MD   Encounter Date: 09/07/2021   PT End of Session - 09/07/21 1654     Visit Number 6    Number of Visits 9    Date for PT Re-Evaluation 09/14/21    Authorization Type UHC Medicare/Medicaid    PT Start Time [phone]   arrived late   PT Stop Time 1015    PT Time Calculation (min) 34 min    Activity Tolerance Patient tolerated treatment well;No increased pain    Behavior During Therapy [facility] for tasks assessed/performed             Past Medical History:  Diagnosis Date   Anxiety    Arthritis    COPD (chronic obstructive pulmonary disease) ([place])    Depression    Dyspnea    H/O colonoscopy 2018   NORMAL   H/O echocardiogram 10/07/2018   MODERATE TO SEVERE  AORTIC VALVE STENOSIS..EF 40-50%   H/O endoscopy 2018   Heart murmur    Hodgkins lymphoma ([place])    in remission   Hyperlipidemia, group A    Hypertension, benign    Left bundle branch block    Narcotic dependence, in remission ([place])    Pneumonia     Past Surgical History:  Procedure Laterality Date   AORTIC VALVE REPLACEMENT     21 mm pericardial tissue valve 2011 in [place]   ASCENDING AORTIC ROOT REPLACEMENT N/A 07/29/2019   Procedure: ASCENDING AORTIC PORCINE ROOT REPLACEMENT USING MEDTRONIC FREESTYLE AORTIC ROOT SIZE 105mm and HEMOSHIELD STRAIGHT PLATINUM GRAFT SIZE 24;  Surgeon: [name], MD;  Location: [facility];  Service: Open Heart Surgery;  Laterality: N/A;   CARDIAC CATHETERIZATION     12/22/18: 40% proximal RCA, normal LM, LAD, LCX   CARDIAC VALVE REPLACEMENT     AV[place] pericardial tissue valve 2011 ([place])   collarbone surgery Right    CORONARY ARTERY BYPASS GRAFT     lymph angiogram     when pt.   was 17 yrs. old   RIGHT/LEFT HEART CATH AND CORONARY ANGIOGRAPHY N/A 12/22/2018   Procedure: RIGHT/LEFT HEART CATH AND CORONARY ANGIOGRAPHY;  Surgeon: Nigel Mormon, MD;  Location: [facility] CV LAB;  Service: Cardiovascular;  Laterality: N/A;   SPLENECTOMY     STERNAL WIRES REMOVAL N/A 08/17/2021   Procedure: STERNAL WIRES REMOVAL;  Surgeon: [name], MD;  Location: [facility];  Service: Thoracic;  Laterality: N/A;   TEE WITHOUT CARDIOVERSION N/A 12/22/2018   Procedure: TRANSESOPHAGEAL ECHOCARDIOGRAM (TEE);  Surgeon: Nigel Mormon, MD;  Location: [facility] ENDOSCOPY;  Service: Cardiovascular;  Laterality: N/A;   TEE WITHOUT CARDIOVERSION N/A 07/29/2019   Procedure: TRANSESOPHAGEAL ECHOCARDIOGRAM (TEE);  Surgeon: [name], MD;  Location: [facility];  Service: Open Heart Surgery;  Laterality: N/A;   TOTAL THYROIDECTOMY      There were no vitals filed for this visit.   Subjective Assessment - 09/07/21 0944     Subjective "Eh, still looking at the ground."  Sciatica pain is better today; used a softball for massage, tennis ball too small.  Goes to see Dr. about sciatica today.  Pt reports he has NO sternal precautions (beats on chest).  PT reviewed most recent note by cardiothoracic surgeon which states sternum  is stable and once incisions healed, pt may return to regular activity.  Pt is 21 days out from wire removal.  Pt denies pain in sternum.    Patient Stated Goals Pt's goals for therapy to no longer have to hold head up with my hand.    Currently in Pain? No/denies    Pain Onset More than a month ago               [facility] Adult PT Treatment/Exercise - 09/07/21 0956       Exercises   Exercises Other Exercises    Other Exercises  Supine on mat on wedge and LE supported: isometric scap retraction x 10, added green theraband for resistance (PT held above patient) while performing shoulder extension with elbow flexed x 12 reps and shoulder extension with elbow extended x 12 reps.   Performed isometric chin tuck with neck extension with 2 > 1 pillow and holding 10 > 15 > 20 seconds.  Therapist provided feedback for correct mm activation and cues to maintain activation; pt noted to have less activation on L compared to R.  Attempted to try theraband around occiput and press back into band but band kept slipping.  Transitioned to prone and performed neck extension against gravity with forehead support in face pillow x 12 reps.  Progressed to performing UE extensions with neck extension x 12 reps with 6 second hold.  Transitioned to quadruped and performed neck extensions against gravity with cues to look up with eyes for increased activation of neck extensors x 12 reps with 6 second hold.                PT Short Term Goals - 08/22/21 1527       PT SHORT TERM GOAL #1   Title STGs = LTGs               PT Long Term Goals - 08/22/21 1527       PT LONG TERM GOAL #1   Title Pt will be independent with HEP to address flexibility, strength, balance for improved overall functional mobility.  TARGET 09/14/2021    Time 4    Period Weeks    Status New      PT LONG TERM GOAL #2   Title Pt will be able to sustain hold of neck positioning in upright position without UE support x 30 seconds to demonstrate improved neck extensor strength.    Baseline unable at eval    Time 4    Period Weeks    Status New      PT LONG TERM GOAL #3   Title Pt will improve resting neck position to chin at least 1 inch from chest, for improved cervical spine strength.    Baseline chin resting on chest.    Time 4    Period Weeks      PT LONG TERM GOAL #4   Title Further balance testing to be performed and goals written as appropriate (DGI vs FGA?)      PT LONG TERM GOAL #5   Title Pt will verbalize understanding of fall prevention in home envrionment.    Time 4    Period Weeks    Status New                   Plan - 09/07/21 1654     Clinical Impression Statement  Progressed pt from supine on wedge isometric shoulder and neck extension exercises to utilizing theraband  for resistance and performing exercises in prone/quadruped for activation against gravity.  Pt demonstrated increased ability to hold activation for >15 seconds with gravity minimized.  Will continue to address and progress towards LTG.    Personal Factors and Comorbidities Comorbidity 3+    Comorbidities anxiety, arthritis, COPD, depression, Hodgkins Lymphoma, HTN, aortic root replacement 2020 with sternal wires removed 08/17/21, CABG/cardiac valve replacement 2011, collarbone surgery R    Examination-Activity Limitations Locomotion Level;Self Feeding;Carry;Dressing;Hygiene/Grooming;Stand    Examination-Participation Restrictions Occupation;Community Activity;Driving;Shop    Stability/Clinical Decision Making Evolving/Moderate complexity    Rehab Potential Good    PT Frequency 2x / week    PT Duration 4 weeks   including eval week   PT Treatment/Interventions ADLs/Self Care Home Management;Neuromuscular re-education;Balance training;Therapeutic exercise;Therapeutic activities;Functional mobility training;Patient/family education;Orthotic Fit/Training;Manual techniques;Passive range of motion    PT Next Visit Plan Goals and recert due 84/13.  Update and progress HEP: continue to work in prone/quadruped for activation against gravity.  Review HEP for neck extensor strenghtening and low back flexibility-supine neck retraction, scapular retraction, pelvic tilts; continue progression to semi-reclined positions for neck extension; anterior chest stretching; further balance assessment/exercises as appropriate.  May need more than the initial 4-wks based on progress with therapy  Juliann Pulse has article with sample progression of exercises)    Consulted and Agree with Plan of Care Patient             Patient will benefit from skilled therapeutic intervention in order to improve the following deficits and  impairments:  Decreased range of motion, Decreased balance, Impaired flexibility, Postural dysfunction, Decreased strength  Visit Diagnosis: Muscle weakness (generalized)  Abnormal posture  Unsteadiness on feet     Problem List Patient Active Problem List   Diagnosis Date Noted   Normocytic normochromic anemia 03/14/2021   Leg swelling 01/13/2020   Encounter for removal of sutures 08/13/2019   S/P aortic valve replacement and aortoplasty 07/29/2019   Coronary artery disease involving native coronary artery of native heart without angina pectoris 01/13/2019   Leg edema 01/13/2019   Leg cramps 01/13/2019   Narcotic dependence, in remission ([place])    Hyperlipidemia, group A    Hypertension, benign    Hodgkins lymphoma ([place])    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 [place], [place], [address] Phone: [phone]   Fax:  [phone]  Name: Henry Sheppard MRN: [id number] Date of Birth: Jan 04, 1966

## 2021-09-11 ENCOUNTER — Other Ambulatory Visit: Payer: Self-pay

## 2021-09-11 ENCOUNTER — Encounter: Payer: Self-pay | Admitting: Physical Therapy

## 2021-09-11 ENCOUNTER — Ambulatory Visit: Payer: Medicare Other | Admitting: Physical Therapy

## 2021-09-11 DIAGNOSIS — R2681 Unsteadiness on feet: Secondary | ICD-10-CM

## 2021-09-11 DIAGNOSIS — M6281 Muscle weakness (generalized): Secondary | ICD-10-CM

## 2021-09-11 DIAGNOSIS — R293 Abnormal posture: Secondary | ICD-10-CM | POA: Diagnosis not present

## 2021-09-11 NOTE — Therapy (Signed)
[facility] [address] [place], [place], [address] Phone: [phone]   Fax:  [phone]  Physical Therapy Treatment  Patient Details  Name: Henry Sheppard MRN: [id number] Date of Birth: 1966/01/03 Referring Provider (PT): Ledora Bottcher, MD   Encounter Date: 09/11/2021   PT End of Session - 09/11/21 1203     Visit Number 7    Number of Visits 9    Date for PT Re-Evaluation 09/14/21    Authorization Type UHC Medicare/Medicaid    PT Start Time 0807    PT Stop Time 0846    PT Time Calculation (min) 39 min    Activity Tolerance Patient tolerated treatment well    Behavior During Therapy [facility] for tasks assessed/performed             Past Medical History:  Diagnosis Date   Anxiety    Arthritis    COPD (chronic obstructive pulmonary disease) ([place])    Depression    Dyspnea    H/O colonoscopy 2018   NORMAL   H/O echocardiogram 10/07/2018   MODERATE TO SEVERE  AORTIC VALVE STENOSIS..EF 40-50%   H/O endoscopy 2018   Heart murmur    Hodgkins lymphoma ([place])    in remission   Hyperlipidemia, group A    Hypertension, benign    Left bundle branch block    Narcotic dependence, in remission ([place])    Pneumonia     Past Surgical History:  Procedure Laterality Date   AORTIC VALVE REPLACEMENT     21 mm pericardial tissue valve 2011 in [place]   ASCENDING AORTIC ROOT REPLACEMENT N/A 07/29/2019   Procedure: ASCENDING AORTIC PORCINE ROOT REPLACEMENT USING MEDTRONIC FREESTYLE AORTIC ROOT SIZE 63m and HEMOSHIELD STRAIGHT PLATINUM GRAFT SIZE 24;  Surgeon: BGaye Pollack MD;  Location: [facility]  Service: Open Heart Surgery;  Laterality: N/A;   CARDIAC CATHETERIZATION     12/22/18: 40% proximal RCA, normal LM, LAD, LCX   CARDIAC VALVE REPLACEMENT     [place] pericardial tissue valve 2011 ([place])   collarbone surgery Right    CORONARY ARTERY BYPASS GRAFT     lymph angiogram     when pt.  was 17 yrs. old   RIGHT/LEFT  HEART CATH AND CORONARY ANGIOGRAPHY N/A 12/22/2018   Procedure: RIGHT/LEFT HEART CATH AND CORONARY ANGIOGRAPHY;  Surgeon: PNigel Mormon MD;  [facility];  Service: Cardiovascular;  Laterality: N/A;   SPLENECTOMY     STERNAL WIRES REMOVAL N/A 08/17/2021   Procedure: STERNAL WIRES REMOVAL;  Surgeon: BGaye Pollack MD;  [place] [facility]  Service: Thoracic;  Laterality: N/A;   TEE WITHOUT CARDIOVERSION N/A 12/22/2018   Procedure: TRANSESOPHAGEAL ECHOCARDIOGRAM (TEE);  Surgeon: PNigel Mormon MD;  [facility];  Service: Cardiovascular;  Laterality: N/A;   TEE WITHOUT CARDIOVERSION N/A 07/29/2019   Procedure: TRANSESOPHAGEAL ECHOCARDIOGRAM (TEE);  Surgeon: BGaye Pollack MD;  [place] [facility]  Service: Open Heart Surgery;  Laterality: N/A;   TOTAL THYROIDECTOMY      There were no vitals filed for this visit.   Subjective Assessment - 09/11/21 0806     Subjective Pt reports general pain today, but "not bad."  Pt reported going will be going to the vascular doctor on Friday 10/28    Currently in Pain? Yes    Pain Score 5    General/All over   Pain Location Back    Pain Orientation Left;Lower    Pain Descriptors /  Indicators Sharp;Burning    Pain Type Acute pain;Neuropathic pain    Pain Radiating Towards down left leg    Pain Onset More than a month ago    Pain Frequency Constant    Aggravating Factors  ceratin movements    Pain Relieving Factors not much                               [facility] Adult PT Treatment/Exercise - 09/11/21 0810       Ambulation/Gait   Ambulation/Gait Yes    Ambulation/Gait Assistance 5: Supervision    Ambulation Distance (Feet) --   around clinic with session   Assistive device None    Gait Pattern Step-through pattern    Ambulation Surface Level;Indoor      Exercises   Exercises Other Exercises    Other Exercises  Supine on mat on wedge and LE supported: isometric scap retraction x 10, added green  theraband for resistance (PT held above patient) while performing shoulder retractions with elbow flexed x 12 reps and B horizontal abduction with green theraband x 12.  Performed isometric chin tuck with neck extension with 2 > 1 pillow and holding 10-15 sec.  Therapist provided feedback for correct mm activation and cues to maintain neutral head position.  Transitioned to prone and performed neck extension against gravity with forehead support in face pillow and max manual assist x 12 reps.  Progressed to performing UE extensions x 12 reps, UE overhead extensions x 10 reps, and UE horizontal abduction x 10 reps.  Transitioned to sitting and performed neck extensions against gravity with cues to look up with eyes for increased activation of neck extensors x 12 reps with 5 second hold.                       PT Short Term Goals - 09/11/21 0910       PT SHORT TERM GOAL #1   Title STGs = LTGs    Status Not Met              PT Long Term Goals - 09/11/21 0911       PT LONG TERM GOAL #1   Title Pt will be independent with HEP to address flexibility, strength, balance for improved overall functional mobility.  TARGET 09/14/2021    Baseline 09/11/21: met with current program    Period --    Status Achieved      PT LONG TERM GOAL #2   Title Pt will be able to sustain hold of neck positioning in upright position without UE support x 30 seconds to demonstrate improved neck extensor strength.    Baseline 09/11/21: met in session with increased fatigue/decreased extension noted as time progressed    Time --    Period --    Status Achieved      PT LONG TERM GOAL #3   Title Pt will improve resting neck position to chin at least 1 inch from chest, for improved cervical spine strength.    Baseline 09/11/21: chin continues to rest on chest with resting position    Time --    Period --    Status Not Met      PT LONG TERM GOAL #4   Title Further balance testing to be performed and  goals written as appropriate (DGI vs FGA?)    Baseline 09/11/21: remains unappropriate at this time    Status  On-going      PT LONG TERM GOAL #5   Title Pt will verbalize understanding of fall prevention in home envrionment.    Time 4    Period Weeks    Status New               Plan - 09/11/21 0810     Clinical Impression Statement Progressed from supine on wedge isometric shoulder and neck extension exercising to using theraband for resistance and performing extension exercises in prone for activation against gravity. Pt met goal for hold head up for 30 seconds, though demonstrated decreased extension/fatigue towards end of 30 sec. Pt could benefit from further skilled PT to address all remaining functional limitations.    Personal Factors and Comorbidities Comorbidity 3+    Comorbidities anxiety, arthritis, COPD, depression, Hodgkins Lymphoma, HTN, aortic root replacement 2020 with sternal wires removed 08/17/21, CABG/cardiac valve replacement 2011, collarbone surgery R    Examination-Activity Limitations Locomotion Level;Self Feeding;Carry;Dressing;Hygiene/Grooming;Stand    Examination-Participation Restrictions Occupation;Community Activity;Driving;Shop    Stability/Clinical Decision Making Evolving/Moderate complexity    Rehab Potential Good    PT Frequency 2x / week    PT Duration 4 weeks   including eval week   PT Treatment/Interventions ADLs/Self Care Home Management;Neuromuscular re-education;Balance training;Therapeutic exercise;Therapeutic activities;Functional mobility training;Patient/family education;Orthotic Fit/Training;Manual techniques;Passive range of motion    PT Next Visit Plan Goals and recert due 78/29.  Update and progress HEP: continue to work in prone/quadruped for activation against gravity.  Review HEP for neck extensor strenghtening and low back flexibility-supine neck retraction, scapular retraction, pelvic tilts; continue progression to semi-reclined  positions for neck extension; anterior chest stretching; further balance assessment/exercises as appropriate.  May need more than the initial 4-wks based on progress with therapy  Juliann Pulse has article with sample progression of exercises)    Consulted and Agree with Plan of Care Patient                      Patient will benefit from skilled therapeutic intervention in order to improve the following deficits and impairments:  Decreased range of motion, Decreased balance, Impaired flexibility, Postural dysfunction, Decreased strength  Visit Diagnosis: Muscle weakness (generalized)  Abnormal posture  Unsteadiness on feet     Problem List Patient Active Problem List   Diagnosis Date Noted   Normocytic normochromic anemia 03/14/2021   Leg swelling 01/13/2020   Encounter for removal of sutures 08/13/2019   S/P aortic valve replacement and aortoplasty 07/29/2019   Coronary artery disease involving native coronary artery of native heart without angina pectoris 01/13/2019   Leg edema 01/13/2019   Leg cramps 01/13/2019   Narcotic dependence, in remission ([place])    Hyperlipidemia, group A    Hypertension, benign    Hodgkins lymphoma ([place])    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   S/P AVR 12/21/2018   Exertional dyspnea 12/21/2018   Opioid dependence ([place]) 12/21/2018   Essential hypertension 12/21/2018   H/O echocardiogram 10/07/2018   Other secondary pulmonary hypertension ([place]) 07/30/2018   HFrEF (heart failure with reduced ejection fraction) ([place]) 07/30/2018   High cholesterol 07/30/2018   H/O endoscopy 11/18/2016   H/O colonoscopy 11/18/2016    Rondel Baton, SPTA 09/11/2021, 12:18 PM  [facility] [address] [place], [place], [address] Phone: [phone]   Fax:  [phone]  Name: Henry Sheppard MRN: [id number] Date of Birth: September 17, 1966  This note has been reviewed and edited by  supervising CI.  Willow Ora, PTA, [facility] [address], [place], [place] [address] [phone] 09/12/21, 10:26 AM

## 2021-09-13 ENCOUNTER — Other Ambulatory Visit: Payer: Self-pay

## 2021-09-13 ENCOUNTER — Encounter: Payer: Self-pay | Admitting: Physical Therapy

## 2021-09-13 ENCOUNTER — Ambulatory Visit: Payer: Medicare Other | Admitting: Physical Therapy

## 2021-09-13 DIAGNOSIS — R2681 Unsteadiness on feet: Secondary | ICD-10-CM

## 2021-09-13 DIAGNOSIS — R293 Abnormal posture: Secondary | ICD-10-CM | POA: Diagnosis not present

## 2021-09-13 DIAGNOSIS — M6281 Muscle weakness (generalized): Secondary | ICD-10-CM

## 2021-09-13 NOTE — Therapy (Signed)
[facility] [address] [place], [place], [address] Phone: [phone]   Fax:  [phone]  Physical Therapy Treatment  Patient Details  Name: Henry Sheppard MRN: [id number] Date of Birth: 05-25-1966 Referring Provider (PT): Ledora Bottcher, MD   Encounter Date: 09/13/2021   PT End of Session - 09/13/21 0727     Visit Number 8    Number of Visits 9    Date for PT Re-Evaluation 09/14/21    Authorization Type UHC Medicare/Medicaid    PT Start Time 0719    PT Stop Time 0800    PT Time Calculation (min) 41 min    Activity Tolerance Patient tolerated treatment well;No increased pain    Behavior During Therapy [facility] for tasks assessed/performed             Past Medical History:  Diagnosis Date   Anxiety    Arthritis    COPD (chronic obstructive pulmonary disease) ([place])    Depression    Dyspnea    H/O colonoscopy 2018   NORMAL   H/O echocardiogram 10/07/2018   MODERATE TO SEVERE  AORTIC VALVE STENOSIS..EF 40-50%   H/O endoscopy 2018   Heart murmur    Hodgkins lymphoma ([place])    in remission   Hyperlipidemia, group A    Hypertension, benign    Left bundle branch block    Narcotic dependence, in remission ([place])    Pneumonia     Past Surgical History:  Procedure Laterality Date   AORTIC VALVE REPLACEMENT     21 mm pericardial tissue valve 2011 in [place]   ASCENDING AORTIC ROOT REPLACEMENT N/A 07/29/2019   Procedure: ASCENDING AORTIC PORCINE ROOT REPLACEMENT USING MEDTRONIC FREESTYLE AORTIC ROOT SIZE 50mm and HEMOSHIELD STRAIGHT PLATINUM GRAFT SIZE 24;  Surgeon: [name], MD;  Location: [place];  Service: Open Heart Surgery;  Laterality: N/A;   CARDIAC CATHETERIZATION     12/22/18: 40% proximal RCA, normal LM, LAD, LCX   CARDIAC VALVE REPLACEMENT     AVR pericardial tissue valve 2011 ([place])   collarbone surgery Right    CORONARY ARTERY BYPASS GRAFT     lymph angiogram     when pt.  was 17 yrs.  old   RIGHT/LEFT HEART CATH AND CORONARY ANGIOGRAPHY N/A 12/22/2018   Procedure: RIGHT/LEFT HEART CATH AND CORONARY ANGIOGRAPHY;  Surgeon: Nigel Mormon, MD;  Location: [facility];  Service: Cardiovascular;  Laterality: N/A;   SPLENECTOMY     STERNAL WIRES REMOVAL N/A 08/17/2021   Procedure: STERNAL WIRES REMOVAL;  Surgeon: [name], MD;  Location: [place];  Service: Thoracic;  Laterality: N/A;   TEE WITHOUT CARDIOVERSION N/A 12/22/2018   Procedure: TRANSESOPHAGEAL ECHOCARDIOGRAM (TEE);  Surgeon: Nigel Mormon, MD;  Location: [facility] ENDOSCOPY;  Service: Cardiovascular;  Laterality: N/A;   TEE WITHOUT CARDIOVERSION N/A 07/29/2019   Procedure: TRANSESOPHAGEAL ECHOCARDIOGRAM (TEE);  Surgeon: [name], MD;  Location: [place];  Service: Open Heart Surgery;  Laterality: N/A;   TOTAL THYROIDECTOMY      There were no vitals filed for this visit.   Subjective Assessment - 09/13/21 0724     Subjective Having increased back pain today from prone ex's on last session and blowing leaves in yard. Did not take anything for it.    Patient Stated Goals Pt's goals for therapy to no longer have to hold head up with my hand.    Currently in Pain? Yes    Pain Score  9     Pain Location Back    Pain Orientation Left;Lower    Pain Descriptors / Indicators Burning;Sharp    Pain Type Acute pain;Neuropathic pain    Pain Onset More than a month ago    Pain Frequency Constant    Aggravating Factors  certain movements    Pain Relieving Factors not much                   [facility] Adult PT Treatment/Exercise - 09/13/21 0727       Transfers   Transfers Sit to Stand;Stand to Sit    Sit to Stand 6: Modified independent (Device/Increase time)    Stand to Sit 6: Modified independent (Device/Increase time)      Ambulation/Gait   Ambulation/Gait Yes    Ambulation/Gait Assistance 5: Supervision    Ambulation Distance (Feet) --   around clinic with session   Assistive device None     Gait Pattern Step-through pattern    Ambulation Surface Level;Indoor      Self-Care   Self-Care Other Self-Care Comments    Other Self-Care Comments  discussed fall prevention strategies. pt able to verbalize some idea. Reviewed other fall prevention strategies with patient with no questiosn at the end.      Exercises   Exercises Other Exercises    Other Exercises  supine on mat with wedge with two pillows at head: cervical retraction for 5 sec holds x 15 reps with manual assist to stay in neutral position; isometric lateral flexion for 5 sec holds x 15 reps each side with manual assist to stay in neutral. cues for chin tuck with all of these; with green band resistance: with PTA standing over pt for rows, then shoulder extension with elbow extension for 15 reps, cues for chin tuck, along with shoulder positioning. then pt performed shoulder horizontal abduction, then alternating diagonals for 10 reps each, cues on form and technique. then pt moved into prone with forehead resting on face pillow/wedge: had pt work on 'hovering' head above pillowcase on pillow for short range cervical extension for 10 reps. then with UE's: shoulder extension with arms down by pt side, shoulder horizontal abduction with arms out at sides, then shoulder flexion with arms up overhead for 10 reps each, bil arms moving together. cues for form and cues to keep head down and into cervical neutral position.                    PT Short Term Goals - 09/11/21 0910       PT SHORT TERM GOAL #1   Title STGs = LTGs    Status Not Met               PT Long Term Goals - 09/13/21 0757       PT LONG TERM GOAL #1   Title Pt will be independent with HEP to address flexibility, strength, balance for improved overall functional mobility.  TARGET 09/14/2021    Baseline 09/11/21: met with current program    Status Achieved      PT LONG TERM GOAL #2   Title Pt will be able to sustain hold of neck positioning in  upright position without UE support x 30 seconds to demonstrate improved neck extensor strength.    Baseline 09/11/21: met in session with increased fatigue/decreased extension noted as time progressed    Status Achieved      PT LONG TERM GOAL #3   Title Pt  will improve resting neck position to chin at least 1 inch from chest, for improved cervical spine strength.    Baseline 09/11/21: chin continues to rest on chest with resting position    Status Not Met      PT LONG TERM GOAL #4   Title Further balance testing to be performed and goals written as appropriate (DGI vs FGA?)    Baseline 09/11/21: remains unappropriate at this time    Status On-going      PT LONG TERM GOAL #5   Title Pt will verbalize understanding of fall prevention in home envrionment.    Baseline 09/13/21: met in session today    Status Achieved                   Plan - 09/13/21 0727     Clinical Impression Statement Today's session focused on progress toward remaining LTG with pt meeting goal for fall prevention. Remainder of session continued to focus on cervical/scapular strengthening with no issues noted or reported in session. Primary PT plans to recert for 2 x a week for 8 more weeks. The pt is making progress and should benefit from continued PT to progress toward unmet goals.    Personal Factors and Comorbidities Comorbidity 3+    Comorbidities anxiety, arthritis, COPD, depression, Hodgkins Lymphoma, HTN, aortic root replacement 2020 with sternal wires removed 08/17/21, CABG/cardiac valve replacement 2011, collarbone surgery R    Examination-Activity Limitations Locomotion Level;Self Feeding;Carry;Dressing;Hygiene/Grooming;Stand    Examination-Participation Restrictions Occupation;Community Activity;Driving;Shop    Stability/Clinical Decision Making Evolving/Moderate complexity    Rehab Potential Good    PT Frequency 2x / week    PT Duration 4 weeks   including eval week   PT Treatment/Interventions  ADLs/Self Care Home Management;Neuromuscular re-education;Balance training;Therapeutic exercise;Therapeutic activities;Functional mobility training;Patient/family education;Orthotic Fit/Training;Manual techniques;Passive range of motion    PT Next Visit Plan PrImary PT to recert. Pt to transfer to [facility] closer to his house after today's session. Update and progress HEP: continue to work in prone/quadruped for activation against gravity.  Review HEP for neck extensor strenghtening and low back flexibility-supine neck retraction, scapular retraction, pelvic tilts; continue progression to semi-reclined positions for neck extension; anterior chest stretching; further balance assessment/exercises as appropriate.  Juliann Pulse has article with sample progression of exercises- to be faxed over to [facility] today by front office.    PT Home Exercise Plan Access Code: [email or web address]    Consulted and Agree with Plan of Care Patient             Patient will benefit from skilled therapeutic intervention in order to improve the following deficits and impairments:  Decreased range of motion, Decreased balance, Impaired flexibility, Postural dysfunction, Decreased strength  Visit Diagnosis: Muscle weakness (generalized)  Abnormal posture  Unsteadiness on feet     Problem List Patient Active Problem List   Diagnosis Date Noted   Normocytic normochromic anemia 03/14/2021   Leg swelling 01/13/2020   Encounter for removal of sutures 08/13/2019   S/P aortic valve replacement and aortoplasty 07/29/2019   Coronary artery disease involving native coronary artery of native heart without angina pectoris 01/13/2019   Leg edema 01/13/2019   Leg cramps 01/13/2019   Narcotic dependence, in remission ([place])    Hyperlipidemia, group A    Hypertension, benign    Hodgkins lymphoma ([place])    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

## 2021-09-14 ENCOUNTER — Ambulatory Visit: Payer: Medicare Other

## 2021-09-17 ENCOUNTER — Encounter: Payer: Medicare Other | Admitting: Physical Therapy

## 2021-09-18 ENCOUNTER — Encounter: Payer: Self-pay | Admitting: Physical Therapy

## 2021-09-18 ENCOUNTER — Ambulatory Visit: Payer: Medicare Other | Attending: Family Medicine | Admitting: Physical Therapy

## 2021-09-18 ENCOUNTER — Other Ambulatory Visit: Payer: Self-pay

## 2021-09-18 DIAGNOSIS — R2681 Unsteadiness on feet: Secondary | ICD-10-CM | POA: Insufficient documentation

## 2021-09-18 DIAGNOSIS — M6281 Muscle weakness (generalized): Secondary | ICD-10-CM | POA: Diagnosis present

## 2021-09-18 DIAGNOSIS — R293 Abnormal posture: Secondary | ICD-10-CM | POA: Insufficient documentation

## 2021-09-18 NOTE — Therapy (Signed)
[place]. [place], [place], [address] Phone: [phone]   Fax:  [phone]  Physical Therapy Treatment  Patient Details  Name: Henry Sheppard MRN: [id number] Date of Birth: Nov 08, 1966 Referring Provider (PT): Ledora Bottcher, MD   Encounter Date: 09/18/2021   PT End of Session - 09/18/21 1450     Visit Number 9    Number of Visits 27    Date for PT Re-Evaluation 11/13/21    Authorization Type UHC Medicare/Medicaid    PT Start Time 0846    PT Stop Time 0932    PT Time Calculation (min) 46 min    Activity Tolerance Patient tolerated treatment well;No increased pain    Behavior During Therapy [facility] for tasks assessed/performed             Past Medical History:  Diagnosis Date   Anxiety    Arthritis    COPD (chronic obstructive pulmonary disease) ([place])    Depression    Dyspnea    H/O colonoscopy 2018   NORMAL   H/O echocardiogram 10/07/2018   MODERATE TO SEVERE  AORTIC VALVE STENOSIS..EF 40-50%   H/O endoscopy 2018   Heart murmur    Hodgkins lymphoma ([place])    in remission   Hyperlipidemia, group A    Hypertension, benign    Left bundle branch block    Narcotic dependence, in remission ([place])    Pneumonia     Past Surgical History:  Procedure Laterality Date   AORTIC VALVE REPLACEMENT     21 mm pericardial tissue valve 2011 in [place]   ASCENDING AORTIC ROOT REPLACEMENT N/A 07/29/2019   Procedure: ASCENDING AORTIC PORCINE ROOT REPLACEMENT USING MEDTRONIC FREESTYLE AORTIC ROOT SIZE 85m and HEMOSHIELD STRAIGHT PLATINUM GRAFT SIZE 24;  Surgeon: BGaye Pollack MD;  [place] [facility]  Service: Open Heart Surgery;  Laterality: N/A;   CARDIAC CATHETERIZATION     12/22/18: 40% proximal RCA, normal LM, LAD, LCX   CARDIAC VALVE REPLACEMENT     AVR pericardial tissue valve 2011 ([place])   collarbone surgery Right    CORONARY ARTERY BYPASS GRAFT     lymph angiogram     when pt.  was 17 yrs. old    RIGHT/LEFT HEART CATH AND CORONARY ANGIOGRAPHY N/A 12/22/2018   Procedure: RIGHT/LEFT HEART CATH AND CORONARY ANGIOGRAPHY;  Surgeon: PNigel Mormon MD;  [facility];  Service: Cardiovascular;  Laterality: N/A;   SPLENECTOMY     STERNAL WIRES REMOVAL N/A 08/17/2021   Procedure: STERNAL WIRES REMOVAL;  Surgeon: BGaye Pollack MD;  [place] [facility]  Service: Thoracic;  Laterality: N/A;   TEE WITHOUT CARDIOVERSION N/A 12/22/2018   Procedure: TRANSESOPHAGEAL ECHOCARDIOGRAM (TEE);  Surgeon: PNigel Mormon MD;  [facility];  Service: Cardiovascular;  Laterality: N/A;   TEE WITHOUT CARDIOVERSION N/A 07/29/2019   Procedure: TRANSESOPHAGEAL ECHOCARDIOGRAM (TEE);  Surgeon: BGaye Pollack MD;  [place] [facility]  Service: Open Heart Surgery;  Laterality: N/A;   TOTAL THYROIDECTOMY      There were no vitals filed for this visit.   Subjective Assessment - 09/18/21 0849     Subjective He returned from vascular surgeon with recommendation for sciatic surgery, He reports they burned the nerve B in August to relieve pain, but the pain has returned just on the L after the nerve grew back. They recommend srugery to cut the nerve, but it has not been scheduled. He has also been referred  to a neurologist, due to weakness in L lateral lower leg below the knee which is causing pain and falls.He has not scheduled a visit. 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 He has been followed at [facility] for a month and received treatment at another clinic prior to that. He feels like his previous therapy at [facility] has been beneficial. 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    Limitations Sitting;Walking;Lifting;Standing;House hold activities    How long can you sit comfortably? He rarely sits-stands by choice-sitting hurts    How long can you stand comfortably? All day    How long can you walk comfortably? N/A from neck, but has problems  with leg.    Diagnostic tests All testing negative- Has a rare muscular disease, but the Neurologist at [facility] says they can't treat it because they can't specifically identify.    Patient Stated Goals Pt's goals for therapy to no longer have to hold head up with my hand.    Currently in Pain? Yes    Pain Score 5     Pain Location Neck    Pain Orientation Left;Distal;Proximal    Pain Descriptors / Indicators Sharp    Pain Onset More than a month ago    Pain Frequency Constant    Aggravating Factors  sitting                [facility] PT Assessment - 09/18/21 0001       Assessment   Medical Diagnosis dropped head syndrome    Referring Provider (PT) Talbert Forest Corrington, MD    Onset Date/Surgical Date 08/15/21    Hand Dominance Right    Prior Therapy has been seeing [facility] for past 2 months (is planning to discharge), [facility]      Precautions   Precautions Fall      Balance Screen   Has the patient fallen in the past 6 months Yes    How many times? multiple    Has the patient had a decrease in activity level because of a fear of falling?  No    Is the patient reluctant to leave their home because of a fear of falling?  No      Home Ecologist residence    Living Arrangements Spouse/significant other    Available Help at Discharge Family    Type of [place] to enter    Entrance Stairs-Number of Steps 3    Entrance Stairs-Rails Right    [place] One level      Prior Function   Level of Independence Independent    Vocation Full time employment      Posture/Postural Control   Posture/Postural Control Postural limitations    Postural Limitations Forward head;Posterior pelvic tilt    Posture Comments In sitting:  pt holds head in flexion and sidebending to R, lateral rotation to left.  At rest, chin is resting on chest.      ROM / Strength   AROM / PROM / Strength AROM      AROM   Overall AROM   Deficits    Overall AROM Comments From neutral position of goniometer, pt's resting neck flexion is 138 degrees (chin resting on chest).  With chin supported by UE/hand, pt is able to achieve near neutral position of neck (with compensation of trunk) and perform the following:  R rotation  40 degrees, L rotation 32 degrees.  He holds head in sidebend position approx 8 degrees to R.      Strength   Overall Strength Deficits    Overall Strength Comments Pt is unable to activate cervical extensors in sitting position against gravity.  When sitting and head/chin supported by hand, when he lets go, neck goes directly into extreme cervical flexion.  In supine with one pillow and PT support under head, pt is able to activate cervical extensor muscles in antigravity position, performing 10 reps 3 sec holds of cervical retraction.    Strength Assessment Site Hip;Knee;Ankle    Right/Left Hip Right;Left    Right Hip Flexion 3+/5    Right Hip Extension 4-/5    Right Hip ABduction 3+/5    Left Hip Flexion 3+/5    Left Hip Extension 3+/5    Left Hip ABduction 3/5    Right/Left Knee Right;Left    Right Knee Flexion 4-/5    Right Knee Extension 4-/5    Left Knee Flexion 3+/5    Left Knee Extension 3+/5    Right/Left Ankle Right;Left    Right Ankle Dorsiflexion 3+/5    Right Ankle Plantar Flexion 3/5    Right Ankle Inversion 3-/5    Right Ankle Eversion 3/5      Ambulation/Gait   Ambulation/Gait Yes    Ambulation/Gait Assistance 7: Independent    Ambulation Distance (Feet) 100 Feet    Assistive device None    Gait Pattern Step-through pattern    Ambulation Surface Level    Gait velocity 5.08 sec =3.94 '/sec      Functional Gait  Assessment   Gait assessed  Yes    Gait Level Surface Walks 20 ft in less than 7 sec but greater than 5.5 sec, uses assistive device, slower speed, mild gait deviations, or deviates 6-10 in outside of the 12 in walkway width.    Change in Gait Speed Able to change speed,  demonstrates mild gait deviations, deviates 6-10 in outside of the 12 in walkway width, or no gait deviations, unable to achieve a major change in velocity, or uses a change in velocity, or uses an assistive device.    Gait with Horizontal Head Turns Performs head turns with moderate changes in gait velocity, slows down, deviates 10-15 in outside 12 in walkway width but recovers, can continue to walk.    Gait with Vertical Head Turns Performs head turns with no change in gait. Deviates no more than 6 in outside 12 in walkway width.    Gait and Pivot Turn Pivot turns safely in greater than 3 sec and stops with no loss of balance, or pivot turns safely within 3 sec and stops with mild imbalance, requires small steps to catch balance.    Step Over Obstacle Is able to step over one shoe box (4.5 in total height) but must slow down and adjust steps to clear box safely. May require verbal cueing.    Gait with Narrow Base of Support Ambulates 7-9 steps.    Gait with Eyes Closed Walks 20 ft, uses assistive device, slower speed, mild gait deviations, deviates 6-10 in outside 12 in walkway width. Ambulates 20 ft in less than 9 sec but greater than 7 sec.    Ambulating Backwards Walks 20 ft, uses assistive device, slower speed, mild gait deviations, deviates 6-10 in outside 12 in walkway width.    Steps Alternating feet, no rail.    Total Score 20  FGA comment: Patient falls below age norm and falls into higher fall risk category.                           [facility] Adult PT Treatment/Exercise - 09/18/21 0001       Modalities   Modalities Electrical Stimulation;Moist Heat      Moist Heat Therapy   Number Minutes Moist Heat 15 Minutes    Moist Heat Location Cervical      Electrical Stimulation   Electrical Stimulation Location cervical/traps    Electrical Stimulation Action Interferrential    Electrical Stimulation Parameters to tolerance    Electrical Stimulation Goals Pain       Manual Therapy   Manual Therapy Soft tissue mobilization    Manual therapy comments cervical, upper traps                     PT Education - 09/18/21 1446     Education Details Continue HEP    Person(s) Educated Patient    Methods Explanation    Comprehension Verbalized understanding              PT Short Term Goals - 09/18/21 1456       PT SHORT TERM GOAL #1   Title STGs = LTGs    Status Not Met      PT SHORT TERM GOAL #2   Title Patient will become I with HEP for BLE strength and balance.    Baseline Initiated, but not written    Time 4    Period Weeks    Status New    Target Date 10/16/21               PT Long Term Goals - 09/18/21 1457       PT LONG TERM GOAL #1   Title Pt will be independent with HEP to address flexibility, strength, balance for improved overall functional mobility.  TARGET 09/14/2021    Baseline 09/11/21: met with current program    Status Achieved      PT LONG TERM GOAL #2   Title Pt will be able to sustain hold of neck positioning in upright position without UE support x 30 seconds to demonstrate improved neck extensor strength.    Baseline 09/11/21: met in session with increased fatigue/decreased extension noted as time progressed    Status Achieved      PT LONG TERM GOAL #3   Title Pt will improve resting neck position to chin at least 1 inch from chest, for improved cervical spine strength.    Baseline 09/11/21: chin continues to rest on chest with resting position    Time 8    Status Not Met    Target Date 11/13/21      PT LONG TERM GOAL #4   Title Further balance testing to be performed and goals written as appropriate (DGI vs FGA?)    Baseline 09/11/21: remains unappropriate at this time    Status Achieved      PT LONG TERM GOAL #5   Title Pt will verbalize understanding of fall prevention in home envrionment.    Baseline 09/13/21: met in session today    Status Achieved      Additional Long Term Goals    Additional Long Term Goals Yes      PT LONG TERM GOAL #6   Title Patient will be I with final HEP.    Time 8  Period Weeks    Status New    Target Date 11/13/21      PT LONG TERM GOAL #7   Title Patient will improve FGA score by at least 6 points to bring him to medium for age and to decreased fall risk.    Baseline 20/30    Time 8    Period Weeks    Status New    Target Date 11/13/21                   Plan - 09/18/21 1451     Clinical Impression Statement Patient's status re-assessed for UPOC. FGA demonstrates patient is at increased fall risk. he demonstrates progress iwth his neck strength, but still remains severely impaired, chin to chest in stance, unless he holds head up with his hand, furhter adding to his fall risk.    Personal Factors and Comorbidities Comorbidity 3+    Comorbidities anxiety, arthritis, COPD, depression, Hodgkins Lymphoma, HTN, aortic root replacement 2020 with sternal wires removed 08/17/21, CABG/cardiac valve replacement 2011, collarbone surgery R    Examination-Activity Limitations Locomotion Level;Self Feeding;Carry;Dressing;Hygiene/Grooming;Stand    Examination-Participation Restrictions Occupation;Community Activity;Driving;Shop    Stability/Clinical Decision Making Evolving/Moderate complexity    Rehab Potential Good    PT Frequency 2x / week    PT Duration 4 weeks   including eval week   PT Treatment/Interventions ADLs/Self Care Home Management;Neuromuscular re-education;Balance training;Therapeutic exercise;Therapeutic activities;Functional mobility training;Patient/family education;Orthotic Fit/Training;Manual techniques;Passive range of motion    PT Next Visit Plan [facility] PT to recert. Pt to transfer to [facility] closer to his house after today's session. Update and progress HEP: continue to work in prone/quadruped for activation against gravity.  Review HEP for neck extensor strenghtening and low back flexibility-supine  neck retraction, scapular retraction, pelvic tilts; continue progression to semi-reclined positions for neck extension; anterior chest stretching; further balance assessment/exercises as appropriate.  Juliann Pulse has article with sample progression of exercises- to be faxed over to [facility] today by front office.    PT Home Exercise Plan Access Code: [email or web address]    Consulted and Agree with Plan of Care Patient             Patient will benefit from skilled therapeutic intervention in order to improve the following deficits and impairments:  Decreased range of motion, Decreased balance, Impaired flexibility, Postural dysfunction, Decreased strength  Visit Diagnosis: Muscle weakness (generalized)  Abnormal posture  Unsteadiness on feet     Problem List Patient Active Problem List   Diagnosis Date Noted   Normocytic normochromic anemia 03/14/2021   Leg swelling 01/13/2020   Encounter for removal of sutures 08/13/2019   S/P aortic valve replacement and aortoplasty 07/29/2019   Coronary artery disease involving native coronary artery of native heart without angina pectoris 01/13/2019   Leg edema 01/13/2019   Leg cramps 01/13/2019   Narcotic dependence, in remission ([place])    Hyperlipidemia, group A    Hypertension, benign    Hodgkins lymphoma ([place])    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 [place], [place], [address] Phone: [phone]   Fax:  [phone]  Name: MATTI MINNEY MRN: [id number] Date of Birth: [birth year]

## 2021-09-19 ENCOUNTER — Encounter: Payer: Medicare Other | Admitting: Physical Therapy

## 2021-09-20 ENCOUNTER — Encounter: Payer: Self-pay | Admitting: Physical Therapy

## 2021-09-20 ENCOUNTER — Other Ambulatory Visit: Payer: Self-pay

## 2021-09-20 ENCOUNTER — Ambulatory Visit: Payer: Medicare Other | Admitting: Physical Therapy

## 2021-09-20 DIAGNOSIS — M6281 Muscle weakness (generalized): Secondary | ICD-10-CM | POA: Diagnosis not present

## 2021-09-20 DIAGNOSIS — R2681 Unsteadiness on feet: Secondary | ICD-10-CM

## 2021-09-20 DIAGNOSIS — R293 Abnormal posture: Secondary | ICD-10-CM

## 2021-09-20 NOTE — Therapy (Signed)
[name], [place], [address] Phone: [phone]   Fax:  [phone]  Physical Therapy Treatment  Patient Details  Name: Henry Sheppard MRN: [id number] Date of Birth: [birth year] Referring Provider (PT): Ledora Bottcher, MD   Encounter Date: 09/20/2021   PT End of Session - 09/20/21 1628     Visit Number 10    Number of Visits 27    Date for PT Re-Evaluation 11/13/21    Authorization Type UHC Medicare/Medicaid    PT Start Time 0845    PT Stop Time 0920    PT Time Calculation (min) 35 min    Activity Tolerance Patient tolerated treatment well;No increased pain    Behavior During Therapy [facility] for tasks assessed/performed             Past Medical History:  Diagnosis Date   Anxiety    Arthritis    COPD (chronic obstructive pulmonary disease) ([place])    Depression    Dyspnea    H/O colonoscopy 2018   NORMAL   H/O echocardiogram 10/07/2018   MODERATE TO SEVERE  AORTIC VALVE STENOSIS..EF 40-50%   H/O endoscopy 2018   Heart murmur    Hodgkins lymphoma ([place])    in remission   Hyperlipidemia, group A    Hypertension, benign    Left bundle branch block    Narcotic dependence, in remission ([place])    Pneumonia     Past Surgical History:  Procedure Laterality Date   AORTIC VALVE REPLACEMENT     21 mm pericardial tissue valve 2011 in [place]   ASCENDING AORTIC ROOT REPLACEMENT N/A 07/29/2019   Procedure: ASCENDING AORTIC PORCINE ROOT REPLACEMENT USING MEDTRONIC FREESTYLE AORTIC ROOT SIZE 33m and HEMOSHIELD STRAIGHT PLATINUM GRAFT SIZE 24;  Surgeon: BGaye Pollack MD;  Location: [facility]  Service: Open Heart Surgery;  Laterality: N/A;   CARDIAC CATHETERIZATION     12/22/18: 40% proximal RCA, normal LM, LAD, LCX   CARDIAC VALVE REPLACEMENT     AV[place] pericardial tissue valve 2011 ([place])   collarbone surgery Right    CORONARY ARTERY BYPASS GRAFT     lymph angiogram     when pt.  was 17 yrs. old    RIGHT/LEFT HEART CATH AND CORONARY ANGIOGRAPHY N/A 12/22/2018   Procedure: RIGHT/LEFT HEART CATH AND CORONARY ANGIOGRAPHY;  Surgeon: PNigel Mormon MD;  [facility];  Service: Cardiovascular;  Laterality: N/A;   SPLENECTOMY     STERNAL WIRES REMOVAL N/A 08/17/2021   Procedure: STERNAL WIRES REMOVAL;  Surgeon: BGaye Pollack MD;  [facility]: [facility]  Service: Thoracic;  Laterality: N/A;   TEE WITHOUT CARDIOVERSION N/A 12/22/2018   Procedure: TRANSESOPHAGEAL ECHOCARDIOGRAM (TEE);  Surgeon: PNigel Mormon MD;  [facility];  Service: Cardiovascular;  Laterality: N/A;   TEE WITHOUT CARDIOVERSION N/A 07/29/2019   Procedure: TRANSESOPHAGEAL ECHOCARDIOGRAM (TEE);  Surgeon: BGaye Pollack MD;  [place] [facility]  Service: Open Heart Surgery;  Laterality: N/A;   TOTAL THYROIDECTOMY      There were no vitals filed for this visit.   Subjective Assessment - 09/20/21 0926     Subjective Shorter visit today due to an appointment across town. He reports no changes.    Limitations Sitting;Walking;Lifting;Standing;House hold activities    How long can you sit comfortably? He rarely sits-stands by choice-sitting hurts    How long can you stand comfortably? All day    How long can you walk  comfortably? N/A from neck, but has problems with leg.    Diagnostic tests All testing negative- Has a rare muscular disease, but the Neurologist at [facility] says they can't treat it because they can't specifically identify.    Patient Stated Goals Pt's goals for therapy to no longer have to hold head up with my hand.    Currently in Pain? No/denies    Pain Onset More than a month ago                               [facility] Adult PT Treatment/Exercise - 09/20/21 0001       Exercises   Exercises Knee/Hip      Neck Exercises: Seated   Other Seated Exercise Seated edge of mat, Therapist facilitated lifting head to neutral, patient held x 10 seconds, repeat x 10, min A from  Therapist, with multiple compensatory actions from up traps, ant chest.    Other Seated Exercise Seated with back to wall. Placed head back against wall. Pulled head forward to neutral position and held in neutral as tolerated, approximately 8-10 seconds before returning to wall to rest. Repeat x 8 before fatigued.      Neck Exercises: Supine   Other Supine Exercise Active chin tucks into the pillow. Chin tucks iwth head rotation B, 10 reps each, therapist faciltiated stretch with turn to the L.      Knee/Hip Exercises: Standing   SLS Alternately tapping each foot on 8" step, progressing multiple taps and then tapping with pelvic rotation, required up to min a to attempt to rotate over BOS in single limb stance.had more difficulty with L SLS.    Other Standing Knee Exercises B heel raises on step, 2 x 10 reps      Manual Therapy   Manual Therapy Soft tissue mobilization    Manual therapy comments cervical, upper traps                     PT Education - 09/20/21 1627     Education Details Continue HEP.    Person(s) Educated Patient    Methods Explanation    Comprehension Verbalized understanding              PT Short Term Goals - 09/18/21 1456       PT SHORT TERM GOAL #1   Title STGs = LTGs    Status Not Met      PT SHORT TERM GOAL #2   Title Patient will become I with HEP for BLE strength and balance.    Baseline Initiated, but not written    Time 4    Period Weeks    Status New    Target Date 10/16/21               PT Long Term Goals - 09/18/21 1457       PT LONG TERM GOAL #1   Title Pt will be independent with HEP to address flexibility, strength, balance for improved overall functional mobility.  TARGET 09/14/2021    Baseline 09/11/21: met with current program    Status Achieved      PT LONG TERM GOAL #2   Title Pt will be able to sustain hold of neck positioning in upright position without UE support x 30 seconds to demonstrate improved neck  extensor strength.    Baseline 09/11/21: met in session with increased fatigue/decreased extension noted as time  progressed    Status Achieved      PT LONG TERM GOAL #3   Title Pt will improve resting neck position to chin at least 1 inch from chest, for improved cervical spine strength.    Baseline 09/11/21: chin continues to rest on chest with resting position    Time 8    Status Not Met    Target Date 11/13/21      PT LONG TERM GOAL #4   Title Further balance testing to be performed and goals written as appropriate (DGI vs FGA?)    Baseline 09/11/21: remains unappropriate at this time    Status Achieved      PT LONG TERM GOAL #5   Title Pt will verbalize understanding of fall prevention in home envrionment.    Baseline 09/13/21: met in session today    Status Achieved      Additional Long Term Goals   Additional Long Term Goals Yes      PT LONG TERM GOAL #6   Title Patient will be I with final HEP.    Time 8    Period Weeks    Status New    Target Date 11/13/21      PT LONG TERM GOAL #7   Title Patient will improve FGA score by at least 6 points to bring him to medium for age and to decreased fall risk.    Baseline 20/30    Time 8    Period Weeks    Status New    Target Date 11/13/21                   Plan - 09/20/21 1629     Clinical Impression Statement Patient particiapted well. he demosntrates multiple compensations in his attempts to hold his head up, including using upp traps and excessive chest elevation. Initiated standing activities to facilitate improved balance and decrease fall risk    Personal Factors and Comorbidities Comorbidity 3+    Comorbidities anxiety, arthritis, COPD, depression, Hodgkins Lymphoma, HTN, aortic root replacement 2020 with sternal wires removed 08/17/21, CABG/cardiac valve replacement 2011, collarbone surgery R    Examination-Activity Limitations Locomotion Level;Self Feeding;Carry;Dressing;Hygiene/Grooming;Stand     Examination-Participation Restrictions Occupation;Community Activity;Driving;Shop    Stability/Clinical Decision Making Evolving/Moderate complexity    Clinical Decision Making Moderate    Rehab Potential Good    PT Frequency 2x / week    PT Duration 4 weeks   including eval week   PT Treatment/Interventions ADLs/Self Care Home Management;Neuromuscular re-education;Balance training;Therapeutic exercise;Therapeutic activities;Functional mobility training;Patient/family education;Orthotic Fit/Training;Manual techniques;Passive range of motion    PT Next Visit Plan PrImary PT to recert. Pt to transfer to [facility] closer to his house after today's session. Update and progress HEP: continue to work in prone/quadruped for activation against gravity.  Review HEP for neck extensor strenghtening and low back flexibility-supine neck retraction, scapular retraction, pelvic tilts; continue progression to semi-reclined positions for neck extension; anterior chest stretching; further balance assessment/exercises as appropriate.  Juliann Pulse has article with sample progression of exercises- to be faxed over to [facility] today by front office.    PT Home Exercise Plan Access Code: [email or web address]    Consulted and Agree with Plan of Care Patient             Patient will benefit from skilled therapeutic intervention in order to improve the following deficits and impairments:  Decreased range of motion, Decreased balance, Impaired flexibility, Postural dysfunction, Decreased strength  Visit Diagnosis:  Abnormal posture  Unsteadiness on feet  Muscle weakness (generalized)     Problem List Patient Active Problem List   Diagnosis Date Noted   Normocytic normochromic anemia 03/14/2021   Leg swelling 01/13/2020   Encounter for removal of sutures 08/13/2019   S/P aortic valve replacement and aortoplasty 07/29/2019   Coronary artery disease involving native coronary artery of native heart without  angina pectoris 01/13/2019   Leg edema 01/13/2019   Leg cramps 01/13/2019   Narcotic dependence, in remission ([place])    Hyperlipidemia, group A    Hypertension, benign    Hodgkins lymphoma ([place])    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 [place], [place], [address] Phone: [phone]   Fax:  [phone]  Name: ROLFE HARTSELL MRN: [id number] Date of Birth: 1966/02/19

## 2021-09-24 ENCOUNTER — Other Ambulatory Visit: Payer: Self-pay

## 2021-09-24 ENCOUNTER — Encounter: Payer: Self-pay | Admitting: Physical Therapy

## 2021-09-24 ENCOUNTER — Ambulatory Visit: Payer: Medicare Other | Admitting: Physical Therapy

## 2021-09-24 DIAGNOSIS — R2681 Unsteadiness on feet: Secondary | ICD-10-CM

## 2021-09-24 DIAGNOSIS — M6281 Muscle weakness (generalized): Secondary | ICD-10-CM | POA: Diagnosis not present

## 2021-09-24 DIAGNOSIS — R293 Abnormal posture: Secondary | ICD-10-CM

## 2021-09-24 NOTE — Therapy (Signed)
[place] [place], [place], [address] Phone: [phone]   Fax:  [phone]  Physical Therapy Treatment  Patient Details  Name: Henry Sheppard MRN: [id number] Date of Birth: 03-15-1966 Referring Provider (PT): Ledora Bottcher, MD   Encounter Date: 09/24/2021   PT End of Session - 09/24/21 0929     Visit Number 11    Number of Visits 27    Date for PT Re-Evaluation 11/13/21    Authorization Type UHC Medicare/Medicaid    PT Start Time 0848    PT Stop Time 0928    PT Time Calculation (min) 40 min    Activity Tolerance Patient tolerated treatment well;No increased pain    Behavior During Therapy [facility] for tasks assessed/performed             Past Medical History:  Diagnosis Date   Anxiety    Arthritis    COPD (chronic obstructive pulmonary disease) ([employer])    Depression    Dyspnea    H/O colonoscopy 2018   NORMAL   H/O echocardiogram 10/07/2018   MODERATE TO SEVERE  AORTIC VALVE STENOSIS..EF 40-50%   H/O endoscopy 2018   Heart murmur    Hodgkins lymphoma ([place])    in remission   Hyperlipidemia, group A    Hypertension, benign    Left bundle branch block    Narcotic dependence, in remission ([place])    Pneumonia     Past Surgical History:  Procedure Laterality Date   AORTIC VALVE REPLACEMENT     21 mm pericardial tissue valve 2011 [place]   ASCENDING AORTIC ROOT REPLACEMENT N/A 07/29/2019   Procedure: ASCENDING AORTIC PORCINE ROOT REPLACEMENT USING MEDTRONIC FREESTYLE AORTIC ROOT SIZE 74m and HEMOSHIELD STRAIGHT PLATINUM GRAFT SIZE 24;  Surgeon: BGaye Pollack MD;  [place] [facility]  Service: Open Heart Surgery;  Laterality: N/A;   CARDIAC CATHETERIZATION     12/22/18: 40% proximal RCA, normal LM, LAD, LCX   CARDIAC VALVE REPLACEMENT     [place] pericardial tissue valve 2011 ([place])   collarbone surgery Right    CORONARY ARTERY BYPASS GRAFT     lymph angiogram     when pt.  was 17 yrs. old    RIGHT/LEFT HEART CATH AND CORONARY ANGIOGRAPHY N/A 12/22/2018   Procedure: RIGHT/LEFT HEART CATH AND CORONARY ANGIOGRAPHY;  Surgeon: PNigel Mormon MD;  [facility];  Service: Cardiovascular;  Laterality: N/A;   SPLENECTOMY     STERNAL WIRES REMOVAL N/A 08/17/2021   Procedure: STERNAL WIRES REMOVAL;  Surgeon: BGaye Pollack MD;  [facility]  Service: Thoracic;  Laterality: N/A;   TEE WITHOUT CARDIOVERSION N/A 12/22/2018   Procedure: TRANSESOPHAGEAL ECHOCARDIOGRAM (TEE);  Surgeon: PNigel Mormon MD;  [facility];  Service: Cardiovascular;  Laterality: N/A;   TEE WITHOUT CARDIOVERSION N/A 07/29/2019   Procedure: TRANSESOPHAGEAL ECHOCARDIOGRAM (TEE);  Surgeon: BGaye Pollack MD;  Location: [facility]  Service: Open Heart Surgery;  Laterality: N/A;   TOTAL THYROIDECTOMY      There were no vitals filed for this visit.   Subjective Assessment - 09/24/21 0849     Subjective My back is starting to hurt, the sciatic ablation didn't work so now I have surgery to get it taken care of. My neck is really bothering me, it bothers me that they don't know what's causing it.    How long can you sit comfortably? He rarely sits-stands by choice-sitting hurts  Patient Stated Goals Pt's goals for therapy to no longer have to hold head up with my hand.    Currently in Pain? Yes    Pain Score 9     Pain Location Neck                               [facility] Adult PT Treatment/Exercise - 09/24/21 0001       Neck Exercises: Supine   Neck Retraction 15 reps;3 secs    Neck Retraction Limitations supine on mat table    Other Supine Exercise cervical extension stretch 4x60 seconds    Other Supine Exercise cervical rotation stretches 2x60 seconds B; scapular retractions 1x15 with 3 second holds      Manual Therapy   Manual Therapy Soft tissue mobilization;Muscle Energy Technique    Manual therapy comments cervical, upper trap B    Muscle Energy  Technique for cervical flexors and B SCMs today                     PT Education - 09/24/21 0929     Education Details exercise form and purpose, MET purpose    Person(s) Educated Patient    Methods Explanation    Comprehension Verbalized understanding              PT Short Term Goals - 09/18/21 1456       PT SHORT TERM GOAL #1   Title STGs = LTGs    Status Not Met      PT SHORT TERM GOAL #2   Title Patient will become I with HEP for BLE strength and balance.    Baseline Initiated, but not written    Time 4    Period Weeks    Status New    Target Date 10/16/21               PT Long Term Goals - 09/18/21 1457       PT LONG TERM GOAL #1   Title Pt will be independent with HEP to address flexibility, strength, balance for improved overall functional mobility.  TARGET 09/14/2021    Baseline 09/11/21: met with current program    Status Achieved      PT LONG TERM GOAL #2   Title Pt will be able to sustain hold of neck positioning in upright position without UE support x 30 seconds to demonstrate improved neck extensor strength.    Baseline 09/11/21: met in session with increased fatigue/decreased extension noted as time progressed    Status Achieved      PT LONG TERM GOAL #3   Title Pt will improve resting neck position to chin at least 1 inch from chest, for improved cervical spine strength.    Baseline 09/11/21: chin continues to rest on chest with resting position    Time 8    Status Not Met    Target Date 11/13/21      PT LONG TERM GOAL #4   Title Further balance testing to be performed and goals written as appropriate (DGI vs FGA?)    Baseline 09/11/21: remains unappropriate at this time    Status Achieved      PT LONG TERM GOAL #5   Title Pt will verbalize understanding of fall prevention in home envrionment.    Baseline 09/13/21: met in session today    Status Achieved      Additional Long Term Goals  Additional Long Term Goals Yes       PT LONG TERM GOAL #6   Title Patient will be I with final HEP.    Time 8    Period Weeks    Status New    Target Date 11/13/21      PT LONG TERM GOAL #7   Title Patient will improve FGA score by at least 6 points to bring him to medium for age and to decreased fall risk.    Baseline 20/30    Time 8    Period Weeks    Status New    Target Date 11/13/21                   Plan - 09/24/21 0930     Clinical Impression Statement Henry Sheppard arrives today frustrated with his neck and low back. Continued working on [employer] prior to exercises per his request, then continued cervical stretches and postural training- unfortunately unable to get much relief today. Might benefit from DN if certified clinician finds him appropriate for this intervention? Trialed muscle energy techniques for cervical flexors and SCMs  today as well. Many compensations noted through upper T-spine, also from general limited mobility. Noted significant muscle atrophy L upper trap and in shoulder stabilizers/rotator cuff today. Will continue efforts.    Personal Factors and Comorbidities Comorbidity 3+    Comorbidities anxiety, arthritis, COPD, depression, Hodgkins Lymphoma, HTN, aortic root replacement 2020 with sternal wires removed 08/17/21, CABG/cardiac valve replacement 2011, collarbone surgery R    Examination-Activity Limitations Locomotion Level;Self Feeding;Carry;Dressing;Hygiene/Grooming;Stand    Examination-Participation Restrictions Occupation;Community Activity;Driving;Shop    Stability/Clinical Decision Making Evolving/Moderate complexity    Clinical Decision Making Moderate    Rehab Potential Good    PT Frequency 2x / week    PT Duration 4 weeks    PT Treatment/Interventions ADLs/Self Care Home Management;Neuromuscular re-education;Balance training;Therapeutic exercise;Therapeutic activities;Functional mobility training;Patient/family education;Orthotic Fit/Training;Manual techniques;Passive range of  motion    PT Next Visit Plan PrImary PT to recert. Pt to transfer to [facility] closer to his house after today's session. Update and progress HEP: continue to work in prone/quadruped for activation against gravity.  Review HEP for neck extensor strenghtening and low back flexibility-supine neck retraction, scapular retraction, pelvic tilts; continue progression to semi-reclined positions for neck extension; anterior chest stretching; further balance assessment/exercises as appropriate.  Juliann Pulse has article with sample progression of exercises- to be faxed over to [facility] today by front office.    PT Home Exercise Plan Access Code: [email or web address]    Consulted and Agree with Plan of Care Patient             Patient will benefit from skilled therapeutic intervention in order to improve the following deficits and impairments:  Decreased range of motion, Decreased balance, Impaired flexibility, Postural dysfunction, Decreased strength  Visit Diagnosis: Abnormal posture  Muscle weakness (generalized)  Unsteadiness on feet     Problem List Patient Active Problem List   Diagnosis Date Noted   Normocytic normochromic anemia 03/14/2021   Leg swelling 01/13/2020   Encounter for removal of sutures 08/13/2019   S/P aortic valve replacement and aortoplasty 07/29/2019   Coronary artery disease involving native coronary artery of native heart without angina pectoris 01/13/2019   Leg edema 01/13/2019   Leg cramps 01/13/2019   Narcotic dependence, in remission ([place])    Hyperlipidemia, group A    Hypertension, benign    Hodgkins lymphoma ([place])    Prosthetic aortic valve  stenosis 12/22/2018   S/P AVR 12/21/2018   Exertional dyspnea 12/21/2018   Opioid dependence ([place]) 12/21/2018   Essential hypertension 12/21/2018   H/O echocardiogram 10/07/2018   Other secondary pulmonary hypertension ([place]) 07/30/2018   HFrEF (heart failure with reduced ejection fraction) ([place]) 07/30/2018    High cholesterol 07/30/2018   H/O endoscopy 11/18/2016   H/O colonoscopy 11/18/2016   Ann Lions PT, DPT, PN2   Supplemental Physical Therapist [place]    Pager [phone] Acute Rehab Office [place]. [place], [place], [address] Phone: [phone]   Fax:  [phone]  Name: Henry Sheppard MRN: [id number] Date of Birth: July 17, 1966

## 2021-09-27 ENCOUNTER — Ambulatory Visit: Payer: Medicare Other | Admitting: Physical Therapy

## 2021-09-27 ENCOUNTER — Encounter: Payer: Self-pay | Admitting: Physical Therapy

## 2021-09-27 ENCOUNTER — Encounter (INDEPENDENT_AMBULATORY_CARE_PROVIDER_SITE_OTHER): Payer: Self-pay

## 2021-09-27 ENCOUNTER — Other Ambulatory Visit: Payer: Self-pay

## 2021-09-27 DIAGNOSIS — R2681 Unsteadiness on feet: Secondary | ICD-10-CM

## 2021-09-27 DIAGNOSIS — R293 Abnormal posture: Secondary | ICD-10-CM

## 2021-09-27 DIAGNOSIS — Z4802 Encounter for removal of sutures: Secondary | ICD-10-CM

## 2021-09-27 DIAGNOSIS — M6281 Muscle weakness (generalized): Secondary | ICD-10-CM

## 2021-09-27 NOTE — Therapy (Signed)
[place], [place], [address] Phone: [phone]   Fax:  [phone]  Physical Therapy Treatment  Patient Details  Name: Henry Sheppard MRN: [id number] Date of Birth: 04-18-1966 Referring Provider (PT): Ledora Bottcher, MD   Encounter Date: 09/27/2021   PT End of Session - 09/27/21 1105     Visit Number 12    Number of Visits 27    Date for PT Re-Evaluation 11/13/21    Authorization Type UHC Medicare/Medicaid    PT Start Time 0932    PT Stop Time 1015    PT Time Calculation (min) 43 min    Activity Tolerance Patient tolerated treatment well;No increased pain    Behavior During Therapy [facility] for tasks assessed/performed             Past Medical History:  Diagnosis Date   Anxiety    Arthritis    COPD (chronic obstructive pulmonary disease) ([place])    Depression    Dyspnea    H/O colonoscopy 2018   NORMAL   H/O echocardiogram 10/07/2018   MODERATE TO SEVERE  AORTIC VALVE STENOSIS..EF 40-50%   H/O endoscopy 2018   Heart murmur    Hodgkins lymphoma ([place])    in remission   Hyperlipidemia, group A    Hypertension, benign    Left bundle branch block    Narcotic dependence, in remission ([place])    Pneumonia     Past Surgical History:  Procedure Laterality Date   AORTIC VALVE REPLACEMENT     21 mm pericardial tissue valve 2011 [place]   ASCENDING AORTIC ROOT REPLACEMENT N/A 07/29/2019   Procedure: ASCENDING AORTIC PORCINE ROOT REPLACEMENT USING MEDTRONIC FREESTYLE AORTIC ROOT SIZE 67m and HEMOSHIELD STRAIGHT PLATINUM GRAFT SIZE 24;  Surgeon: BGaye Pollack MD;  [place]  Service: Open Heart Surgery;  Laterality: N/A;   CARDIAC CATHETERIZATION     12/22/18: 40% proximal RCA, normal LM, LAD, LCX   CARDIAC VALVE REPLACEMENT     AV[place] pericardial tissue valve 2011 ([place])   collarbone surgery Right    CORONARY ARTERY BYPASS GRAFT     lymph angiogram     when pt.  was 17 yrs. old    RIGHT/LEFT HEART CATH AND CORONARY ANGIOGRAPHY N/A 12/22/2018   Procedure: RIGHT/LEFT HEART CATH AND CORONARY ANGIOGRAPHY;  Surgeon: PNigel Mormon MD;  [place] [facility];  Service: Cardiovascular;  Laterality: N/A;   SPLENECTOMY     STERNAL WIRES REMOVAL N/A 08/17/2021   Procedure: STERNAL WIRES REMOVAL;  Surgeon: BGaye Pollack MD;  [place]  Service: Thoracic;  Laterality: N/A;   TEE WITHOUT CARDIOVERSION N/A 12/22/2018   Procedure: TRANSESOPHAGEAL ECHOCARDIOGRAM (TEE);  Surgeon: PNigel Mormon MD;  [facility];  Service: Cardiovascular;  Laterality: N/A;   TEE WITHOUT CARDIOVERSION N/A 07/29/2019   Procedure: TRANSESOPHAGEAL ECHOCARDIOGRAM (TEE);  Surgeon: BGaye Pollack MD;  [place]  Service: Open Heart Surgery;  Laterality: N/A;   TOTAL THYROIDECTOMY      There were no vitals filed for this visit.   Subjective Assessment - 09/27/21 0903     Subjective Patient reports sciatic pain is improving, but he is suffering daily headaches.    Limitations Sitting;Walking;Lifting;Standing;House hold activities    How long can you sit comfortably? He rarely sits-stands by choice-sitting hurts    How long can you stand comfortably? All day    How long can you walk comfortably?  N/A from neck, but has problems with leg.    Diagnostic tests All testing negative- Has a rare muscular disease, but the Neurologist at [facility] says they can't treat it because they can't specifically identify.    Patient Stated Goals Pt's goals for therapy to no longer have to hold head up with my hand.    Currently in Pain? Yes    Pain Location Head    Pain Orientation Posterior;Proximal    Pain Descriptors / Indicators Aching    Pain Type Acute pain    Pain Onset In the past 7 days    Pain Frequency Constant   Reports new onset of HA in past several days.                                Trigger Point Dry Needling - 09/27/21 0001     Consent  Given? Yes    Education Handout Provided Yes    Muscles Treated Head and Neck Oblique capitus;Suboccipitals;Cervical multifidi    Oblique Capitus Response Palpable increased muscle length    Suboccipitals Response Palpable increased muscle length    Cervical multifidi Response Palpable increased muscle length                   PT Education - 09/27/21 1104     Education Details Updated HEP    Person(s) Educated Patient    Methods Explanation;Demonstration;Handout    Comprehension Returned demonstration;Verbalized understanding              PT Short Term Goals - 09/27/21 1109       PT SHORT TERM GOAL #1   Title STGs = LTGs    Status Not Met      PT SHORT TERM GOAL #2   Title Patient will become I with HEP for BLE strength and balance.    Baseline updated    Time 3    Period Weeks    Status On-going    Target Date 10/16/21               PT Long Term Goals - 09/18/21 1457       PT LONG TERM GOAL #1   Title Pt will be independent with HEP to address flexibility, strength, balance for improved overall functional mobility.  TARGET 09/14/2021    Baseline 09/11/21: met with current program    Status Achieved      PT LONG TERM GOAL #2   Title Pt will be able to sustain hold of neck positioning in upright position without UE support x 30 seconds to demonstrate improved neck extensor strength.    Baseline 09/11/21: met in session with increased fatigue/decreased extension noted as time progressed    Status Achieved      PT LONG TERM GOAL #3   Title Pt will improve resting neck position to chin at least 1 inch from chest, for improved cervical spine strength.    Baseline 09/11/21: chin continues to rest on chest with resting position    Time 8    Status Not Met    Target Date 11/13/21      PT LONG TERM GOAL #4   Title Further balance testing to be performed and goals written as appropriate (DGI vs FGA?)    Baseline 09/11/21: remains unappropriate at this  time    Status Achieved      PT LONG TERM GOAL #5   Title Pt  will verbalize understanding of fall prevention in home envrionment.    Baseline 09/13/21: met in session today    Status Achieved      Additional Long Term Goals   Additional Long Term Goals Yes      PT LONG TERM GOAL #6   Title Patient will be I with final HEP.    Time 8    Period Weeks    Status New    Target Date 11/13/21      PT LONG TERM GOAL #7   Title Patient will improve FGA score by at least 6 points to bring him to medium for age and to decreased fall risk.    Baseline 20/30    Time 8    Period Weeks    Status New    Target Date 11/13/21                   Plan - 09/27/21 1105     Clinical Impression Statement Patient reports H/A starting a few days ago and pretty constant. Provided DN to cervical area, with patient reporting some relief of the tightness. Established lower body stretch and strength HEP to address sciatica, which is also flairing up.    Personal Factors and Comorbidities Comorbidity 3+    Comorbidities anxiety, arthritis, COPD, depression, Hodgkins Lymphoma, HTN, aortic root replacement 2020 with sternal wires removed 08/17/21, CABG/cardiac valve replacement 2011, collarbone surgery R    Examination-Activity Limitations Locomotion Level;Self Feeding;Carry;Dressing;Hygiene/Grooming;Stand    Examination-Participation Restrictions Occupation;Community Activity;Driving;Shop    Stability/Clinical Decision Making Evolving/Moderate complexity    Clinical Decision Making Moderate    Rehab Potential Good    PT Frequency 2x / week    PT Duration 3 weeks    PT Treatment/Interventions ADLs/Self Care Home Management;Neuromuscular re-education;Balance training;Therapeutic exercise;Therapeutic activities;Functional mobility training;Patient/family education;Orthotic Fit/Training;Manual techniques;Passive range of motion    PT Next Visit Plan PrImary PT to recert. Pt to transfer to [facility] closer to his house after today's session. Update and progress HEP: continue to work in prone/quadruped for activation against gravity.  Review HEP for neck extensor strenghtening and low back flexibility-supine neck retraction, scapular retraction, pelvic tilts; continue progression to semi-reclined positions for neck extension; anterior chest stretching; further balance assessment/exercises as appropriate.  Juliann Pulse has article with sample progression of exercises- to be faxed over to [facility] today by front office.    PT Home Exercise Plan Access Code: [email or web address]    Consulted and Agree with Plan of Care Patient             Patient will benefit from skilled therapeutic intervention in order to improve the following deficits and impairments:  Decreased range of motion, Decreased balance, Impaired flexibility, Postural dysfunction, Decreased strength  Visit Diagnosis: Abnormal posture  Muscle weakness (generalized)  Unsteadiness on feet     Problem List Patient Active Problem List   Diagnosis Date Noted   Normocytic normochromic anemia 03/14/2021   Leg swelling 01/13/2020   Encounter for removal of sutures 08/13/2019   S/P aortic valve replacement and aortoplasty 07/29/2019   Coronary artery disease involving native coronary artery of native heart without angina pectoris 01/13/2019   Leg edema 01/13/2019   Leg cramps 01/13/2019   Narcotic dependence, in remission ([place])    Hyperlipidemia, group A    Hypertension, benign    Hodgkins lymphoma ([place])    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 [place], [place], [address] Phone: [phone]   Fax:  [phone]  Name: Henry Sheppard MRN: [id number] Date of Birth: 1966-08-02

## 2021-09-27 NOTE — Patient Instructions (Addendum)
Trigger Point Dry Needling  What is Trigger Point Dry Needling (DN)? DN is a physical therapy technique used to treat muscle pain and dysfunction. Specifically, DN helps deactivate muscle trigger points (muscle knots).  A thin filiform needle is used to penetrate the skin and stimulate the underlying trigger point. The goal is for a local twitch response (LTR) to occur and for the trigger point to relax. No medication of any kind is injected during the procedure.   What Does Trigger Point Dry Needling Feel Like?  The procedure feels different for each individual patient. Some patients report that they do not actually feel the needle enter the skin and overall the process is not painful. Very mild bleeding may occur. However, many patients feel a deep cramping in the muscle in which the needle was inserted. This is the local twitch response.   How Will I feel after the treatment? Soreness is normal, and the onset of soreness may not occur for a few hours. Typically this soreness does not last longer than two days.  Bruising is uncommon, however; ice can be used to decrease any possible bruising.  In rare cases feeling tired or nauseous after the treatment is normal. In addition, your symptoms may get worse before they get better, this period will typically not last longer than 24 hours.   What Can I do After My Treatment? Increase your hydration by drinking more water for the next 24 hours. You may place ice or heat on the areas treated that have become sore, however, do not use heat on inflamed or bruised areas. Heat often brings more relief post needling. You can continue your regular activities, but vigorous activity is not recommended initially after the treatment for 24 hours. DN is best combined with other physical therapy such as strengthening, stretching, and other therapies.    Access Code: [email or web address] URL: [email or web address] Date: 09/27/2021 Prepared by: Ethel Rana  Exercises Supine Figure 4 Piriformis Stretch - 1 x daily - 7 x weekly - 3 reps - 15 hold Supine Gluteus Stretch - 1 x daily - 7 x weekly - 3 reps - 15 hold Supine Hamstring Stretch - 1 x daily - 7 x weekly - 3 reps - 15 hold Supine Bridge with Pelvic Floor Contraction - 1 x daily - 7 x weekly - 2 sets - 10 reps Supine Hip Adduction Isometric with Ball - 1 x daily - 7 x weekly - 2 sets - 10 reps Hooklying Clamshell with Resistance - 1 x daily - 7 x weekly - 2 sets - 10 reps

## 2021-10-02 ENCOUNTER — Other Ambulatory Visit: Payer: Self-pay

## 2021-10-02 ENCOUNTER — Ambulatory Visit: Payer: Medicare Other | Admitting: Physical Therapy

## 2021-10-02 DIAGNOSIS — R2681 Unsteadiness on feet: Secondary | ICD-10-CM

## 2021-10-02 DIAGNOSIS — M6281 Muscle weakness (generalized): Secondary | ICD-10-CM | POA: Diagnosis not present

## 2021-10-02 DIAGNOSIS — R293 Abnormal posture: Secondary | ICD-10-CM

## 2021-10-02 NOTE — Therapy (Signed)
[place] [place], [place], [address] Phone: [phone]   Fax:  [phone]  Physical Therapy Treatment  Patient Details  Name: Henry Sheppard MRN: [id number] Date of Birth: 1966/03/07 Referring Provider (PT): Ledora Bottcher, MD   Encounter Date: 10/02/2021   PT End of Session - 10/02/21 1114     Visit Number 13    Number of Visits 27    Date for PT Re-Evaluation 11/13/21    Authorization Type UHC Medicare/Medicaid    PT Start Time 0849    PT Stop Time 0932    PT Time Calculation (min) 43 min    Activity Tolerance Patient tolerated treatment well;No increased pain    Behavior During Therapy [facility] for tasks assessed/performed             Past Medical History:  Diagnosis Date   Anxiety    Arthritis    COPD (chronic obstructive pulmonary disease) ([place])    Depression    Dyspnea    H/O colonoscopy 2018   NORMAL   H/O echocardiogram 10/07/2018   MODERATE TO SEVERE  AORTIC VALVE STENOSIS..EF 40-50%   H/O endoscopy 2018   Heart murmur    Hodgkins lymphoma ([place])    in remission   Hyperlipidemia, group A    Hypertension, benign    Left bundle branch block    Narcotic dependence, in remission ([place])    Pneumonia     Past Surgical History:  Procedure Laterality Date   AORTIC VALVE REPLACEMENT     21 mm pericardial tissue valve 2011 [place]   ASCENDING AORTIC ROOT REPLACEMENT N/A 07/29/2019   Procedure: ASCENDING AORTIC PORCINE ROOT REPLACEMENT USING MEDTRONIC FREESTYLE AORTIC ROOT SIZE 2m and HEMOSHIELD STRAIGHT PLATINUM GRAFT SIZE 24;  Surgeon: BGaye Pollack MD;  [place]  Service: Open Heart Surgery;  Laterality: N/A;   CARDIAC CATHETERIZATION     12/22/18: 40% proximal RCA, normal LM, LAD, LCX   CARDIAC VALVE REPLACEMENT     [place] pericardial tissue valve 2011 ([place])   collarbone surgery Right    CORONARY ARTERY BYPASS GRAFT     lymph angiogram     when pt.  was 17 yrs. old    RIGHT/LEFT HEART CATH AND CORONARY ANGIOGRAPHY N/A 12/22/2018   Procedure: RIGHT/LEFT HEART CATH AND CORONARY ANGIOGRAPHY;  Surgeon: PNigel Mormon MD;  [place];  Service: Cardiovascular;  Laterality: N/A;   SPLENECTOMY     STERNAL WIRES REMOVAL N/A 08/17/2021   Procedure: STERNAL WIRES REMOVAL;  Surgeon: BGaye Pollack MD;  [place]  Service: Thoracic;  Laterality: N/A;   TEE WITHOUT CARDIOVERSION N/A 12/22/2018   Procedure: TRANSESOPHAGEAL ECHOCARDIOGRAM (TEE);  Surgeon: PNigel Mormon MD;  [facility];  Service: Cardiovascular;  Laterality: N/A;   TEE WITHOUT CARDIOVERSION N/A 07/29/2019   Procedure: TRANSESOPHAGEAL ECHOCARDIOGRAM (TEE);  Surgeon: BGaye Pollack MD;  [place]  Service: Open Heart Surgery;  Laterality: N/A;   TOTAL THYROIDECTOMY      There were no vitals filed for this visit.       [facility] - 10/02/21 0001       Strength   Right Hip Flexion 4/5    Right Hip Extension 4-/5    Right Hip External Rotation  4-/5    Left Hip Flexion 4-/5    Left Hip Extension 3+/5    Left Hip ABduction 3+/5    Right  Knee Flexion 4-/5    Right Knee Extension 4/5    Left Knee Flexion 4-/5    Left Knee Extension 4-/5    Right Ankle Dorsiflexion 4-/5    Right Ankle Plantar Flexion 3+/5    Right Ankle Inversion 4-/5    Right Ankle Eversion 3/5    Left Ankle Dorsiflexion 4-/5    Left Ankle Plantar Flexion 3+/5    Left Ankle Inversion 4-/5                                    PT Education - 10/02/21 1113     Education Details Patient educated to limited imporvement noted with PT.    Person(s) Educated Patient    Methods Explanation    Comprehension Verbalized understanding              PT Short Term Goals - 09/27/21 1109       PT SHORT TERM GOAL #1   Title STGs = LTGs    Status Not Met      PT SHORT TERM GOAL #2   Title Patient will become I with HEP for BLE strength and  balance.    Baseline updated    Time 3    Period Weeks    Status On-going    Target Date 10/16/21               PT Long Term Goals - 10/02/21 0918       PT LONG TERM GOAL #1   Title Pt will be independent with HEP to address flexibility, strength, balance for improved overall functional mobility.  TARGET 09/14/2021    Baseline 09/11/21: met with current program    Status Achieved      PT LONG TERM GOAL #2   Title Pt will be able to sustain hold of neck positioning in upright position without UE support x 30 seconds to demonstrate improved neck extensor strength.    Baseline 09/11/21: met in session with increased fatigue/decreased extension noted as time progressed    Status Achieved      PT LONG TERM GOAL #3   Title Pt will improve resting neck position to chin at least 1 inch from chest, for improved cervical spine strength.    Baseline Continues to hold chin to chest. Able to hold chin elevated approximatly 3" off chest x 70 seconds, but reports strain and demosntrates multiple compensations.    Time 8    Status Not Met      PT LONG TERM GOAL #4   Title Further balance testing to be performed and goals written as appropriate (DGI vs FGA?)    Baseline 09/11/21: remains unappropriate at this time    Status Achieved      PT LONG TERM GOAL #5   Title Pt will verbalize understanding of fall prevention in home envrionment.    Baseline 09/13/21: met in session today    Status Achieved      PT LONG TERM GOAL #6   Title Patient will be I with final HEP.    Time 8    Period Weeks    Status On-going      PT LONG TERM GOAL #7   Title Patient will improve FGA score by at least 6 points to bring him to medium for age and to decreased fall risk.    Baseline 25/30    Time 8  Period Weeks    Status On-going                   Plan - 10/02/21 1114     Clinical Impression Statement Patient reports H/A continue, appears discouraged. He feels as if his legs are  getting weaker, although MMT does not indicate increased weakness. He reports that his Neurologist is in [place] and he cannot get there as he can not drive that far. Encouraged patient to reach out to primary Dr to request re-assessment and referrals to appropriate specialists to address cervical weakness, LE weakness. Will continue with PT for 2 more visits to maximize HEP set up.    Personal Factors and Comorbidities Comorbidity 3+    Comorbidities anxiety, arthritis, COPD, depression, Hodgkins Lymphoma, HTN, aortic root replacement 2020 with sternal wires removed 08/17/21, CABG/cardiac valve replacement 2011, collarbone surgery R    Examination-Activity Limitations Locomotion Level;Self Feeding;Carry;Dressing;Hygiene/Grooming;Stand    Examination-Participation Restrictions Occupation;Community Activity;Driving;Shop    Stability/Clinical Decision Making Evolving/Moderate complexity    Clinical Decision Making High    Rehab Potential Good    PT Frequency 2x / week    PT Duration 2 weeks    PT Treatment/Interventions ADLs/Self Care Home Management;Neuromuscular re-education;Balance training;Therapeutic exercise;Therapeutic activities;Functional mobility training;Patient/family education;Orthotic Fit/Training;Manual techniques;Passive range of motion    PT Next Visit Plan PrImary PT to recert. Pt to transfer to [facility] closer to his house after today's session. Update and progress HEP: continue to work in prone/quadruped for activation against gravity.  Review HEP for neck extensor strenghtening and low back flexibility-supine neck retraction, scapular retraction, pelvic tilts; continue progression to semi-reclined positions for neck extension; anterior chest stretching; further balance assessment/exercises as appropriate.  Juliann Pulse has article with sample progression of exercises- to be faxed over to [facility] today by front office.    PT Home Exercise Plan Access Code: [email or web address],  [email or web address]    Consulted and Agree with Plan of Care Patient             Patient will benefit from skilled therapeutic intervention in order to improve the following deficits and impairments:  Decreased range of motion, Decreased balance, Impaired flexibility, Postural dysfunction, Decreased strength  Visit Diagnosis: Abnormal posture  Muscle weakness (generalized)  Unsteadiness on feet     Problem List Patient Active Problem List   Diagnosis Date Noted   Normocytic normochromic anemia 03/14/2021   Leg swelling 01/13/2020   Encounter for removal of sutures 08/13/2019   S/P aortic valve replacement and aortoplasty 07/29/2019   Coronary artery disease involving native coronary artery of native heart without angina pectoris 01/13/2019   Leg edema 01/13/2019   Leg cramps 01/13/2019   Narcotic dependence, in remission ([place])    Hyperlipidemia, group A    Hypertension, benign    Hodgkins lymphoma ([place])    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 [place], [place], [address] Phone: [phone]   Fax:  [phone]  Name: Henry Sheppard MRN: [id number] Date of Birth: 07/15/1966

## 2021-10-04 ENCOUNTER — Other Ambulatory Visit: Payer: Self-pay

## 2021-10-04 ENCOUNTER — Ambulatory Visit: Payer: Medicare Other | Admitting: Physical Therapy

## 2021-10-04 ENCOUNTER — Encounter: Payer: Self-pay | Admitting: Physical Therapy

## 2021-10-04 DIAGNOSIS — R2681 Unsteadiness on feet: Secondary | ICD-10-CM

## 2021-10-04 DIAGNOSIS — M6281 Muscle weakness (generalized): Secondary | ICD-10-CM

## 2021-10-04 DIAGNOSIS — R293 Abnormal posture: Secondary | ICD-10-CM

## 2021-10-04 NOTE — Therapy (Signed)
[facility] [place], [place], [address] Phone: [phone]   Fax:  [phone]  October 04, 2021   No Recipients  Physical Therapy Discharge Summary  Patient: Henry Sheppard  MRN: [id number]  Date of Birth: 1966-04-15   Diagnosis: Abnormal posture  Muscle weakness (generalized)  Unsteadiness on feet Referring Provider (PT): Kip Corrington, MD   The above patient had been seen in Physical Therapy 14 times of 14 treatments scheduled with 0 no shows and 0 cancellations.  The treatment consisted of Strengthening, stretching, balance training,  The patient is: Unchanged  Subjective: Patient discouraged due to lack of progress.  Discharge Findings: He utilizes multiple compensatory movements and muscles to try to hold his head up, has developed headaches daily due to strain at base of skull, and feels he is getting weaker in BLE, although MMT does not shoe decline.  Functional Status at Discharge: I with functional mobility, but limited in all activities due to pain and safety.  Goals Partially Met   Plan - 10/04/21 0958     Clinical Impression Statement Patient is discouraged due to what he feels is a continued decline, with new onset of headaches, swelling in BLE, perceived weakness in BLE. he requested to make today his last treatment day. Therapist re-assessed HEP and added exercises for BLE strength and balance/stability, isometric cervical exercises. Patient would benefit from referral to local Neurologist to review his current status, and either educate him to prognosis or further assess issues.    Personal Factors and Comorbidities Comorbidity 3+    Comorbidities anxiety, arthritis, COPD, depression, Hodgkins Lymphoma, HTN, aortic root replacement 2020 with sternal wires removed 08/17/21, CABG/cardiac valve replacement 2011, collarbone surgery R    Examination-Activity Limitations Locomotion Level;Self  Feeding;Carry;Dressing;Hygiene/Grooming;Stand    Examination-Participation Restrictions Occupation;Community Activity;Driving;Shop    Stability/Clinical Decision Making Unstable/Unpredictable    Clinical Decision Making High    Rehab Potential Fair    PT Frequency 2x / week    PT Duration Other (comment)   D/C PT   PT Treatment/Interventions ADLs/Self Care Home Management;Neuromuscular re-education;Balance training;Therapeutic exercise;Therapeutic activities;Functional mobility training;Patient/family education;Orthotic Fit/Training;Manual techniques;Passive range of motion    PT Home Exercise Plan Access Code: [email or web address],  [email or web address]    Consulted and Agree with Plan of Care Patient             Sincerely,   Marcelina Morel, DPT   CC No Recipients  [address] [place], [place], [address] Phone: [phone]   Fax:  [phone]  Patient: Henry Sheppard  MRN: [id number]  Date of Birth: Sep 03, 1966

## 2021-10-04 NOTE — Patient Instructions (Signed)
Access Code: [email or web address] URL: [email or web address] Date: 10/04/2021 Prepared by: Ethel Rana  Exercises Ankle Inversion Eversion Towel Slide - 1 x daily - 7 x weekly - 3 sets - 10 reps Standing Bilateral Heel Raise on Step - 1 x daily - 7 x weekly - 3 sets - 10 reps Hip Abduction with Resistance Loop - 1 x daily - 7 x weekly - 3 sets - 10 reps Hip Extension with Resistance Loop - 1 x daily - 7 x weekly - 3 sets - 10 reps Hip Extension with Resistance Loop - 1 x daily - 7 x weekly - 3 sets - 10 reps Squat with Chair Touch - 1 x daily - 7 x weekly - 3 sets - 10 reps Seated Isometric Cervical Rotation - 1 x daily - 7 x weekly - 3 sets - 10 reps Standing Isometric Cervical Sidebending with Manual Resistance - 1 x daily - 7 x weekly - 3 sets - 10 reps Seated Isometric Cervical Extension - 1 x daily - 7 x weekly - 3 sets - 10 reps

## 2021-10-09 ENCOUNTER — Ambulatory Visit: Payer: Medicare Other | Admitting: Physical Therapy

## 2021-10-16 ENCOUNTER — Ambulatory Visit: Payer: Medicare Other | Admitting: Physical Therapy

## 2021-10-18 ENCOUNTER — Ambulatory Visit: Payer: Medicare Other | Admitting: Physical Therapy

## 2021-12-12 ENCOUNTER — Encounter: Payer: Self-pay | Admitting: Pulmonary Disease

## 2021-12-12 ENCOUNTER — Other Ambulatory Visit: Payer: Self-pay

## 2021-12-12 ENCOUNTER — Ambulatory Visit (INDEPENDENT_AMBULATORY_CARE_PROVIDER_SITE_OTHER): Payer: Medicare Other | Admitting: Pulmonary Disease

## 2021-12-12 VITALS — BP 116/62 | HR 73 | Temp 98.1°F | Ht 71.0 in | Wt 194.8 lb

## 2021-12-12 DIAGNOSIS — F17210 Nicotine dependence, cigarettes, uncomplicated: Secondary | ICD-10-CM

## 2021-12-12 DIAGNOSIS — G4734 Idiopathic sleep related nonobstructive alveolar hypoventilation: Secondary | ICD-10-CM | POA: Diagnosis not present

## 2021-12-12 DIAGNOSIS — J449 Chronic obstructive pulmonary disease, unspecified: Secondary | ICD-10-CM

## 2021-12-12 DIAGNOSIS — Z720 Tobacco use: Secondary | ICD-10-CM

## 2021-12-12 NOTE — Progress Notes (Signed)
Smoking Cessation Counseling:   The patients current tobacco use: <1 ppd  The patient was advised to quit and impact of smoking on their health.  I assessed the patient's willingness to attempt to quit. I provided methods and skills for cessation. We reviewed medication management of smoking session drugs if appropriate. Resources to help quit smoking were provided. A smoking cessation quit date was set: 10 weeks Follow-up was arranged in our clinic.  The amount of time spent counseling patient was 4 mins    Garner Nash, DO [facility] 12/12/2021 4:43 PM

## 2021-12-12 NOTE — Progress Notes (Signed)
Synopsis: Referred in September 2019 for COPD evaluation by Lilian Coma., MD  Subjective:   PATIENT ID: Henry Sheppard GENDER: male DOB: [birth year], MRN: [id number]  Chief Complaint  Patient presents with   Follow-up    Follow up    Just moved from [place]. He has been diagnosed with COPD a few years ago. He was seeing a pulmonologist in [place]. He is working on quitting smoking. Plans to start chantix with his PCP. Currently established care with [place].   Ov 07/2018: He had a broken collar bone and went for stress test prior which he failed and led to CABG with Valve replacement. He was able to stop smoking for about 2 years. He had a splenectomy at age 42. Unsure if vaccinated. Does not like getting a flu shot because he always gets sick. On occasion he needs oxygen and feels breathless. Climbing stairs makes this worse. Routinely has phlegm production. He has had 1 hospitalization for COPD that was 3 years ago. Has had PFTs in the past. Last chest imaging was in May 2019. (?[place] [facility])   OV 03/08/2020: Patient here today for follow-up.  Patient was referred again to the pulmonary office as he was lost to follow-up after last visit with me in 2019.  Referral again for hypoxemic respiratory failure.  No previous PFTs in our system but documented from last note had them in May 2019.  Still do not have these records.  Today, unfortunately states that he has started smoking again. He is working at his home and redoing furniture. He is still smoking 1ppd. He presents today without oxygen. He checks his O2 regularly. He wears his oxygen at night time but is not using it during the day. He doesn't like wearing it. He is using only albuterol.  He is very depressed today that he still smoking but he states that he has been working on it for several months and has yet to be able to quit.  OV 12/12/2021: Here today for follow-up pulmonary office last  seen in April 2021.  Unfortunately the patient is still smoking.  He says he smoking less than a pack a day.  He does not use his maintenance inhaler regularly.  He uses Trelegy as needed.  Still has significant shortness of breath with exertion.  Wears oxygen at night.  Tries to monitor his O2 sats during the day.  Patient feels as if most of his symptoms are related to low testosterone.  I reviewed his pulmonary function test today in the office which demonstrate stage III COPD.   Past Medical History:  Diagnosis Date   Anxiety    Arthritis    COPD (chronic obstructive pulmonary disease) ([place])    Depression    Dyspnea    H/O colonoscopy 2018   NORMAL   H/O echocardiogram 10/07/2018   MODERATE TO SEVERE  AORTIC VALVE STENOSIS..EF 40-50%   H/O endoscopy 2018   Heart murmur    Hodgkins lymphoma ([place])    in remission   Hyperlipidemia, group A    Hypertension, benign    Left bundle branch block    Narcotic dependence, in remission ([place])    Pneumonia      Family History  Problem Relation Age of Onset   Cancer Father    Cancer Mother    Asthma Brother    Asthma Brother    Lung disease Neg Hx      Past Surgical  History:  Procedure Laterality Date   AORTIC VALVE REPLACEMENT     21 mm pericardial tissue valve 2011 in [place]   ASCENDING AORTIC ROOT REPLACEMENT N/A 07/29/2019   Procedure: ASCENDING AORTIC PORCINE ROOT REPLACEMENT USING MEDTRONIC FREESTYLE AORTIC ROOT SIZE 37mm and HEMOSHIELD STRAIGHT PLATINUM GRAFT SIZE 24;  Surgeon: [name], MD;  Location: [facility];  Service: Open Heart Surgery;  Laterality: N/A;   CARDIAC CATHETERIZATION     12/22/18: 40% proximal RCA, normal LM, LAD, LCX   CARDIAC VALVE REPLACEMENT     AVR pericardial tissue valve 2011 ([place])   collarbone surgery Right    CORONARY ARTERY BYPASS GRAFT     lymph angiogram     when pt.  was 17 yrs. old   RIGHT/LEFT HEART CATH AND CORONARY ANGIOGRAPHY N/A 12/22/2018   Procedure: RIGHT/LEFT HEART CATH AND  CORONARY ANGIOGRAPHY;  Surgeon: Nigel Mormon, MD;  Location: [facility];  Service: Cardiovascular;  Laterality: N/A;   SPLENECTOMY     STERNAL WIRES REMOVAL N/A 08/17/2021   Procedure: STERNAL WIRES REMOVAL;  Surgeon: [name], MD;  Location: [facility];  Service: Thoracic;  Laterality: N/A;   TEE WITHOUT CARDIOVERSION N/A 12/22/2018   Procedure: TRANSESOPHAGEAL ECHOCARDIOGRAM (TEE);  Surgeon: Nigel Mormon, MD;  Location[facility];  Service: Cardiovascular;  Laterality: N/A;   TEE WITHOUT CARDIOVERSION N/A 07/29/2019   Procedure: TRANSESOPHAGEAL ECHOCARDIOGRAM (TEE);  Surgeon: [name], MD;  Location: [facility];  Service: Open Heart Surgery;  Laterality: N/A;   TOTAL THYROIDECTOMY      Social History   Socioeconomic History   Marital status: Married    Spouse name: Not on file   Number of children: 3   Years of education: Not on file   Highest education level: Not on file  Occupational History   Not on file  Tobacco Use   Smoking status: Every Day    Packs/day: 2.00    Years: 34.00    Pack years: 68.00    Types: Cigarettes    Last attempt to quit: 03/16/2019    Years since quitting: 2.7   Smokeless tobacco: Never   Tobacco comments:    started smoking 2 months ago doing about 1ppd as of 03/08/20  Vaping Use   Vaping Use: Never used  Substance and Sexual Activity   Alcohol use: Not Currently   Drug use: Not on file    Comment: smokes 1 joint daily   Sexual activity: Yes  Other Topics Concern   Not on file  Social History Narrative   Not on file   Social Determinants of Health   Financial Resource Strain: Not on file  Food Insecurity: Not on file  Transportation Needs: Not on file  Physical Activity: Not on file  Stress: Not on file  Social Connections: Not on file  Intimate Partner Violence: Not on file     No Known Allergies   Outpatient Medications Prior to Visit  Medication Sig Dispense Refill   albuterol (VENTOLIN HFA) 108 (90  Base) MCG/ACT inhaler Inhale 2 puffs into the lungs every 6 (six) hours as needed (wheezing/shortness of breath). 18 g 6   aspirin EC 81 MG tablet Take 1 tablet (81 mg total) by mouth daily. 90 tablet 3   atorvastatin (LIPITOR) 40 MG tablet Take 20 mg by mouth daily.     Budeson-Glycopyrrol-Formoterol (BREZTRI AEROSPHERE) 160-9-4.8 MCG/ACT AERO Inhale 2 puffs into the lungs in the morning and at bedtime. 5.9  g 0   diazepam (VALIUM) 5 MG tablet Take 5 mg by mouth 2 (two) times daily as needed for anxiety.     fluticasone (FLOVENT HFA) 110 MCG/ACT inhaler Inhale 2 puffs into the lungs 2 (two) times daily.     folic acid (FOLVITE) 671 MCG tablet Take 400 mcg by mouth daily.     levothyroxine (SYNTHROID) 175 MCG tablet Take 175 mcg by mouth daily before breakfast.     losartan (COZAAR) 25 MG tablet Take 25 mg by mouth at bedtime.      meloxicam (MOBIC) 15 MG tablet Take 15 mg by mouth daily as needed for pain.     metoprolol tartrate (LOPRESSOR) 50 MG tablet Take 50 mg by mouth 2 (two) times daily.     MOVANTIK 25 MG TABS tablet Take 25 mg by mouth daily.     OXYGEN Inhale 3 L into the lungs at bedtime.     pantoprazole (PROTONIX) 40 MG tablet Take 40 mg by mouth 2 (two) times daily.     potassium chloride (MICRO-K) 10 MEQ CR capsule Take 10 mEq by mouth 2 (two) times daily.     SUBOXONE 8-2 MG FILM Place 1 Film under the tongue in the morning, at noon, and at bedtime.  0   amoxicillin-clavulanate (AUGMENTIN) 875-125 MG tablet Take 1 tablet by mouth 2 (two) times daily. (Patient not taking: Reported on 12/12/2021)     hydrOXYzine (ATARAX/VISTARIL) 25 MG tablet Take 25 mg by mouth every 6 (six) hours as needed (pain). (Patient not taking: Reported on 12/12/2021)     ibuprofen (ADVIL) 800 MG tablet Take 800 mg by mouth every 8 (eight) hours as needed for moderate pain. (Patient not taking: Reported on 12/12/2021)     predniSONE (DELTASONE) 20 MG tablet Take 60 mg by mouth daily. (Patient not taking:  Reported on 12/12/2021)     sertraline (ZOLOFT) 100 MG tablet Take 200 mg by mouth at bedtime.     torsemide (DEMADEX) 20 MG tablet TAKE 2 TABLETS BY MOUTH EVERY DAY (Patient not taking: Reported on 12/12/2021) 180 tablet 0   No facility-administered medications prior to visit.    Review of Systems  Constitutional:  Negative for chills, fever, malaise/fatigue and weight loss.  HENT:  Negative for hearing loss, sore throat and tinnitus.   Eyes:  Negative for blurred vision and double vision.  Respiratory:  Positive for shortness of breath. Negative for cough, hemoptysis, sputum production, wheezing and stridor.   Cardiovascular:  Negative for chest pain, palpitations, orthopnea, leg swelling and PND.  Gastrointestinal:  Negative for abdominal pain, constipation, diarrhea, heartburn, nausea and vomiting.  Genitourinary:  Negative for dysuria, hematuria and urgency.  Musculoskeletal:  Negative for joint pain and myalgias.  Skin:  Negative for itching and rash.  Neurological:  Negative for dizziness, tingling, weakness and headaches.  Endo/Heme/Allergies:  Negative for environmental allergies. Does not bruise/bleed easily.  Psychiatric/Behavioral:  Negative for depression. The patient is not nervous/anxious and does not have insomnia.   All other systems reviewed and are negative.   Objective:  Physical Exam Vitals reviewed.  Constitutional:      General: He is not in acute distress.    Appearance: He is well-developed.  HENT:     Head: Normocephalic and atraumatic.  Eyes:     General: No scleral icterus.    Conjunctiva/sclera: Conjunctivae normal.     Pupils: Pupils are equal, round, and reactive to light.  Neck:     Vascular: No  JVD.     Trachea: No tracheal deviation.  Cardiovascular:     Rate and Rhythm: Normal rate and regular rhythm.     Heart sounds: Normal heart sounds. No murmur heard. Pulmonary:     Effort: Pulmonary effort is normal. No tachypnea, accessory muscle usage  or respiratory distress.     Breath sounds: No stridor. No wheezing, rhonchi or rales.     Comments: Diminished breath sounds bilaterally no wheeze Abdominal:     General: There is no distension.     Palpations: Abdomen is soft.     Tenderness: There is no abdominal tenderness.  Musculoskeletal:        General: Deformity present. No tenderness.     Cervical back: Neck supple.     Comments: Severe kyphosis of the cervical spine  Lymphadenopathy:     Cervical: No cervical adenopathy.  Skin:    General: Skin is warm and dry.     Capillary Refill: Capillary refill takes less than 2 seconds.     Findings: No rash.  Neurological:     Mental Status: He is alert and oriented to person, place, and time.  Psychiatric:        Behavior: Behavior normal.    Vitals:   12/12/21 1633  BP: 116/62  Pulse: 73  Temp: 98.1 F (36.7 C)  TempSrc: Oral  SpO2: 95%  Weight: 194 lb 12.8 oz (88.4 kg)  Height: 5\' 11"  (1.803 m)   95% on room air BMI Readings from Last 3 Encounters:  12/12/21 27.17 kg/m  08/27/21 25.24 kg/m  08/17/21 25.10 kg/m   Wt Readings from Last 3 Encounters:  12/12/21 194 lb 12.8 oz (88.4 kg)  08/27/21 181 lb (82.1 kg)  08/17/21 180 lb (81.6 kg)   CBC    Component Value Date/Time   WBC 13.1 (H) 08/16/2021 0830   RBC 4.54 08/16/2021 0830   HGB 14.0 08/16/2021 0830   HCT 43.5 08/16/2021 0830   PLT 188 08/16/2021 0830   MCV 95.8 08/16/2021 0830   MCH 30.8 08/16/2021 0830   MCHC 32.2 08/16/2021 0830   RDW 16.8 (H) 08/16/2021 0830   LYMPHSABS 2.1 02/27/2021 1216   MONOABS 1.1 (H) 02/27/2021 1216   EOSABS 0.6 (H) 02/27/2021 1216   BASOSABS 0.1 02/27/2021 1216    No available labs to review  Chest Imaging: Most recent imaging was completed and May 2019 in [place] [facility]. Will request medical records.  Pulmonary Functions Testing Results: TLC  Date Value Ref Range Status  05/24/2019 5.77 L Final    None available to review.  Patient is unsure  when his last PFTs were completed.  FeNO: None  Pathology: None  Echocardiogram: None  Heart Catheterization: None to review has had this in the.    Assessment & Plan:   Stage 3 severe COPD by GOLD classification ([place])  Tobacco use - Plan: Ambulatory Referral for Lung Cancer Scre  Nocturnal hypoxemia  Discussion:  This is a 56 year old gentleman, multiple medical problems history of coronary disease, aortic valve replacement because history of Hodgkin's lymphoma, chest radiation, history of thyroidectomy.  Longstanding history of tobacco abuse.  Still smoking at least a pack per day.  Nocturnal hypoxemia, stage III severe COPD on previous PFTs  Plan: He needs to be on a daily maintenance inhaler. Recommend him start using his Trelegy daily. Continue albuterol for shortness of breath and wheezing. With the recent update in lung cancer screening recommendations he qualifies for lung cancer screening  if his insurance will approve. I have placed referral to our lung cancer screening program. Patient was counseled heavily on tobacco abuse Please see separate documentation regarding this.  Routine COPD follow-up, RTC 1 year or as needed.   Current Outpatient Medications:    albuterol (VENTOLIN HFA) 108 (90 Base) MCG/ACT inhaler, Inhale 2 puffs into the lungs every 6 (six) hours as needed (wheezing/shortness of breath)., Disp: 18 g, Rfl: 6   aspirin EC 81 MG tablet, Take 1 tablet (81 mg total) by mouth daily., Disp: 90 tablet, Rfl: 3   atorvastatin (LIPITOR) 40 MG tablet, Take 20 mg by mouth daily., Disp: , Rfl:    Budeson-Glycopyrrol-Formoterol (BREZTRI AEROSPHERE) 160-9-4.8 MCG/ACT AERO, Inhale 2 puffs into the lungs in the morning and at bedtime., Disp: 5.9 g, Rfl: 0   diazepam (VALIUM) 5 MG tablet, Take 5 mg by mouth 2 (two) times daily as needed for anxiety., Disp: , Rfl:    fluticasone (FLOVENT HFA) 110 MCG/ACT inhaler, Inhale 2 puffs into the lungs 2 (two) times daily., Disp:  , Rfl:    folic acid (FOLVITE) 203 MCG tablet, Take 400 mcg by mouth daily., Disp: , Rfl:    levothyroxine (SYNTHROID) 175 MCG tablet, Take 175 mcg by mouth daily before breakfast., Disp: , Rfl:    losartan (COZAAR) 25 MG tablet, Take 25 mg by mouth at bedtime. , Disp: , Rfl:    meloxicam (MOBIC) 15 MG tablet, Take 15 mg by mouth daily as needed for pain., Disp: , Rfl:    metoprolol tartrate (LOPRESSOR) 50 MG tablet, Take 50 mg by mouth 2 (two) times daily., Disp: , Rfl:    MOVANTIK 25 MG TABS tablet, Take 25 mg by mouth daily., Disp: , Rfl:    OXYGEN, Inhale 3 L into the lungs at bedtime., Disp: , Rfl:    pantoprazole (PROTONIX) 40 MG tablet, Take 40 mg by mouth 2 (two) times daily., Disp: , Rfl:    potassium chloride (MICRO-K) 10 MEQ CR capsule, Take 10 mEq by mouth 2 (two) times daily., Disp: , Rfl:    SUBOXONE 8-2 MG FILM, Place 1 Film under the tongue in the morning, at noon, and at bedtime., Disp: , Rfl: 0   amoxicillin-clavulanate (AUGMENTIN) 875-125 MG tablet, Take 1 tablet by mouth 2 (two) times daily. (Patient not taking: Reported on 12/12/2021), Disp: , Rfl:    hydrOXYzine (ATARAX/VISTARIL) 25 MG tablet, Take 25 mg by mouth every 6 (six) hours as needed (pain). (Patient not taking: Reported on 12/12/2021), Disp: , Rfl:    ibuprofen (ADVIL) 800 MG tablet, Take 800 mg by mouth every 8 (eight) hours as needed for moderate pain. (Patient not taking: Reported on 12/12/2021), Disp: , Rfl:    predniSONE (DELTASONE) 20 MG tablet, Take 60 mg by mouth daily. (Patient not taking: Reported on 12/12/2021), Disp: , Rfl:    sertraline (ZOLOFT) 100 MG tablet, Take 200 mg by mouth at bedtime., Disp: , Rfl:    torsemide (DEMADEX) 20 MG tablet, TAKE 2 TABLETS BY MOUTH EVERY DAY (Patient not taking: Reported on 12/12/2021), Disp: 180 tablet, Rfl: 0   Garner Nash, DO [facility] 12/12/2021 5:02 PM

## 2021-12-12 NOTE — Patient Instructions (Addendum)
Thank you for visiting Dr. Valeta Harms at [facility]. Today we recommend the following:  Orders Placed This Encounter  Procedures   Ambulatory Referral for Lung Cancer Scre   Trelegy use this daily  Albuterol as needed   Return in about 1 year (around 12/12/2022) for with Eric Form, NP, or Dr. Valeta Harms.    Please do your part to reduce the spread of COVID-19.   You must quit smoking or vaping. This is the single most important thing that you can do to improve your lung health.   S = Set a quit date. T = Tell family, friends, and the people around you that you plan to quit. A = Anticipate or plan ahead for the tough times you'll face while quitting. R = Remove cigarettes and other tobacco products from your home, car, and work T = Talk to [place] about getting help to quit  If you need help feel free to reach out to our office, [place] [facility]essation [facility]: [phone], call 1-800-QUIT-NOW, or visit [email or web address]

## 2022-01-18 DIAGNOSIS — E119 Type 2 diabetes mellitus without complications: Secondary | ICD-10-CM

## 2022-02-05 ENCOUNTER — Other Ambulatory Visit: Payer: Self-pay

## 2022-02-05 DIAGNOSIS — Z87891 Personal history of nicotine dependence: Secondary | ICD-10-CM

## 2022-02-05 DIAGNOSIS — F17210 Nicotine dependence, cigarettes, uncomplicated: Secondary | ICD-10-CM

## 2022-02-19 ENCOUNTER — Ambulatory Visit
Admission: RE | Admit: 2022-02-19 | Discharge: 2022-02-19 | Disposition: A | Discharge status: Home / Self Care | Payer: Medicare Other | Source: Ambulatory Visit | Attending: Acute Care | Admitting: Acute Care

## 2022-02-19 ENCOUNTER — Encounter: Payer: Self-pay | Admitting: Acute Care

## 2022-02-19 ENCOUNTER — Ambulatory Visit (INDEPENDENT_AMBULATORY_CARE_PROVIDER_SITE_OTHER): Payer: Medicare Other | Admitting: Acute Care

## 2022-02-19 DIAGNOSIS — F17210 Nicotine dependence, cigarettes, uncomplicated: Secondary | ICD-10-CM | POA: Diagnosis not present

## 2022-02-19 DIAGNOSIS — Z87891 Personal history of nicotine dependence: Secondary | ICD-10-CM

## 2022-02-19 NOTE — Patient Instructions (Signed)
Thank you for participating in the [facility]. It was our pleasure to meet you today. We will call you with the results of your scan within the next few days. Your scan will be assigned a Lung RADS category score by the physicians reading the scans.  This Lung RADS score determines follow up scanning.  See below for description of categories, and follow up screening recommendations. We will be in touch to schedule your follow up screening annually or based on recommendations of our providers. We will fax a copy of your scan results to your Primary Care Physician, or the physician who referred you to the program, to ensure they have the results. Please call the office if you have any questions or concerns regarding your scanning experience or results.  Our office number is [phone]. Please speak with Denise Phelps, RN. , or  Denise Buckner RN, They are  our Lung Cancer Screening RN.'s If They are unavailable when you call, Please leave a message on the voice mail. We will return your call at our earliest convenience.This voice mail is monitored several times a day.  Remember, if your scan is normal, we will scan you annually as long as you continue to meet the criteria for the program. (Age 55-77, Current smoker or smoker who has quit within the last 15 years). If you are a smoker, remember, quitting is the single most powerful action that you can take to decrease your risk of lung cancer and other pulmonary, breathing related problems. We know quitting is hard, and we are here to help.  Please let us know if there is anything we can do to help you meet your goal of quitting. If you are a former smoker, congratulations. We are proud of you! Remain smoke free! Remember you can refer friends or family members through the number above.  We will screen them to make sure they meet criteria for the program. Thank you for helping us take better care of you by  participating in Lung Screening.  You can receive free nicotine replacement therapy ( patches, gum or mints) by calling 1-800-QUIT NOW. Please call so we can get you on the path to becoming  a non-smoker. I know it is hard, but you can do this!  Lung RADS Categories:  Lung RADS 1: no nodules or definitely non-concerning nodules.  Recommendation is for a repeat annual scan in 12 months.  Lung RADS 2:  nodules that are non-concerning in appearance and behavior with a very low likelihood of becoming an active cancer. Recommendation is for a repeat annual scan in 12 months.  Lung RADS 3: nodules that are probably non-concerning , includes nodules with a low likelihood of becoming an active cancer.  Recommendation is for a 6-month repeat screening scan. Often noted after an upper respiratory illness. We will be in touch to make sure you have no questions, and to schedule your 6-month scan.  Lung RADS 4 A: nodules with concerning findings, recommendation is most often for a follow up scan in 3 months or additional testing based on our provider's assessment of the scan. We will be in touch to make sure you have no questions and to schedule the recommended 3 month follow up scan.  Lung RADS 4 B:  indicates findings that are concerning. We will be in touch with you to schedule additional diagnostic testing based on our provider's  assessment of the scan.  Other options for assistance in smoking cessation (   As covered by your insurance benefits)  Hypnosis for smoking cessation  [employer] [phone]  Acupuncture for smoking cessation  [facility] [phone]   

## 2022-02-19 NOTE — Progress Notes (Signed)
Virtual Visit via Telephone Note ? ?I connected with Henry Sheppard on 10/02/21 at  2:00 PM EST by telephone and verified that I am speaking with the correct person using two identifiers. ? ?Location: ?Patient: Home ?Provider: Working from home ?  ?I discussed the limitations, risks, security and privacy concerns of performing an evaluation and management service by telephone and the availability of in person appointments. I also discussed with the patient that there may be a patient responsible charge related to this service. The patient expressed understanding and agreed to proceed. ? ?Shared Decision Making Visit Lung Cancer Screening Program ?([id number]) ? ? ?Eligibility: ?Age 56 y.o. ?Pack Years Smoking History Calculation 57 ?(# packs/per year x # years smoked) ?Recent History of coughing up blood  no ?Unexplained weight loss? yes ?( >Than 15 pounds within the last 6 months ) ?Prior History Lung / other cancer no ?(Diagnosis within the last 5 years already requiring surveillance chest CT Scans). ?Smoking Status Current Smoker ?Former Smokers: Years since quit: NA ? Quit Date: NA ? ?Visit Components: ?Discussion included one or more decision making aids. yes ?Discussion included risk/benefits of screening. yes ?Discussion included potential follow up diagnostic testing for abnormal scans. yes ?Discussion included meaning and risk of over diagnosis. yes ?Discussion included meaning and risk of False Positives. yes ?Discussion included meaning of total radiation exposure. yes ? ?Counseling Included: ?Importance of adherence to annual lung cancer LDCT screening. yes ?Impact of comorbidities on ability to participate in the program. yes ?Ability and willingness to under diagnostic treatment. yes ? ?Smoking Cessation Counseling: ?Current Smokers:  ?Discussed importance of smoking cessation. yes ?Information about tobacco cessation classes and interventions provided to patient. yes ?Patient provided with "ticket" for  LDCT Scan. yes ?Symptomatic Patient. yes ? Counseling(Intermediate counseling: > three minutes) [id number] ?Diagnosis Code: Tobacco Use Z72.0 ?Asymptomatic Patient no ? Counseling NA ?Former Smokers:  ?Discussed the importance of maintaining cigarette abstinence. yes ?Diagnosis Code: Personal History of Nicotine Dependence. B34.193 ?Information about tobacco cessation classes and interventions provided to patient. Yes ?Patient provided with "ticket" for LDCT Scan. yes ?Written Order for Lung Cancer Screening with LDCT placed in Epic. Yes ?(CT Chest Lung Cancer Screening Low Dose W/O CM) [id number] ?Z12.2-Screening of respiratory organs ?Z87.891-Personal history of nicotine dependence ? ? ?I spent 25 minutes of face to face time with him discussing the risks and benefits of lung cancer screening. We viewed a power point together that explained in detail the above noted topics. We took the time to pause the power point at intervals to allow for questions to be asked and answered to ensure understanding. We discussed that he had taken the single most powerful action possible to decrease his risk of developing lung cancer when he quit smoking. I counseled him to remain smoke free, and to contact me if he ever had the desire to smoke again so that I can provide resources and tools to help support the effort to remain smoke free. We discussed the time and location of the scan, and that either  Doroteo Glassman RN or I will call with the results within  24-48 hours of receiving them. He has my card and contact information in the event he needs to speak with me, in addition to a copy of the power point we reviewed as a resource. He verbalized understanding of all of the above and had no further questions upon leaving the office.  ? ? ? ?I explained to the patient that there has been a  high incidence of coronary artery disease noted on these exams. I explained that this is a non-gated exam therefore degree or severity cannot be  determined. This patient is on statin therapy. I have asked the patient to follow-up with their PCP regarding any incidental finding of coronary artery disease and management with diet or medication as they feel is clinically indicated. The patient verbalized understanding of the above and had no further questions. ? ? ?I spent 3 minutes counseling on smoking cessation and the health risks of continued tobacco abuse  ? ? ?Madissen Wyse D. Harris, NP-C ?[facility] ?Personal contact information can be found on Amion  ?02/19/2022, 9:10 AM ? ? ? ? ? ? ? ? ? ?

## 2022-02-21 ENCOUNTER — Other Ambulatory Visit: Payer: Self-pay

## 2022-02-21 DIAGNOSIS — Z87891 Personal history of nicotine dependence: Secondary | ICD-10-CM

## 2022-02-21 DIAGNOSIS — F17210 Nicotine dependence, cigarettes, uncomplicated: Secondary | ICD-10-CM

## 2022-02-21 DIAGNOSIS — Z122 Encounter for screening for malignant neoplasm of respiratory organs: Secondary | ICD-10-CM

## 2022-03-09 ENCOUNTER — Other Ambulatory Visit: Payer: Self-pay | Admitting: Cardiology

## 2022-03-09 DIAGNOSIS — R600 Localized edema: Secondary | ICD-10-CM

## 2022-03-13 ENCOUNTER — Other Ambulatory Visit: Payer: Self-pay | Admitting: Cardiology

## 2022-03-13 DIAGNOSIS — R600 Localized edema: Secondary | ICD-10-CM

## 2022-06-13 ENCOUNTER — Encounter (HOSPITAL_COMMUNITY): Payer: Self-pay | Admitting: Emergency Medicine

## 2022-06-13 ENCOUNTER — Inpatient Hospital Stay (HOSPITAL_COMMUNITY)
Admission: EM | Admit: 2022-06-13 | Discharge: 2022-07-19 | DRG: 193 | Disposition: E | Payer: Medicare Other | Attending: Internal Medicine | Admitting: Internal Medicine

## 2022-06-13 ENCOUNTER — Other Ambulatory Visit: Payer: Self-pay

## 2022-06-13 ENCOUNTER — Emergency Department (HOSPITAL_COMMUNITY): Payer: Medicare Other

## 2022-06-13 DIAGNOSIS — Z791 Long term (current) use of non-steroidal anti-inflammatories (NSAID): Secondary | ICD-10-CM

## 2022-06-13 DIAGNOSIS — R64 Cachexia: Secondary | ICD-10-CM | POA: Diagnosis present

## 2022-06-13 DIAGNOSIS — J120 Adenoviral pneumonia: Principal | ICD-10-CM | POA: Diagnosis present

## 2022-06-13 DIAGNOSIS — J189 Pneumonia, unspecified organism: Principal | ICD-10-CM

## 2022-06-13 DIAGNOSIS — F419 Anxiety disorder, unspecified: Secondary | ICD-10-CM | POA: Diagnosis present

## 2022-06-13 DIAGNOSIS — M1990 Unspecified osteoarthritis, unspecified site: Secondary | ICD-10-CM | POA: Diagnosis present

## 2022-06-13 DIAGNOSIS — F17210 Nicotine dependence, cigarettes, uncomplicated: Secondary | ICD-10-CM | POA: Diagnosis present

## 2022-06-13 DIAGNOSIS — Z7984 Long term (current) use of oral hypoglycemic drugs: Secondary | ICD-10-CM

## 2022-06-13 DIAGNOSIS — Z7951 Long term (current) use of inhaled steroids: Secondary | ICD-10-CM

## 2022-06-13 DIAGNOSIS — I110 Hypertensive heart disease with heart failure: Secondary | ICD-10-CM | POA: Diagnosis present

## 2022-06-13 DIAGNOSIS — Z9981 Dependence on supplemental oxygen: Secondary | ICD-10-CM

## 2022-06-13 DIAGNOSIS — C8190 Hodgkin lymphoma, unspecified, unspecified site: Secondary | ICD-10-CM | POA: Diagnosis present

## 2022-06-13 DIAGNOSIS — J159 Unspecified bacterial pneumonia: Secondary | ICD-10-CM | POA: Diagnosis present

## 2022-06-13 DIAGNOSIS — I5043 Acute on chronic combined systolic (congestive) and diastolic (congestive) heart failure: Secondary | ICD-10-CM | POA: Diagnosis present

## 2022-06-13 DIAGNOSIS — J441 Chronic obstructive pulmonary disease with (acute) exacerbation: Secondary | ICD-10-CM | POA: Diagnosis not present

## 2022-06-13 DIAGNOSIS — I350 Nonrheumatic aortic (valve) stenosis: Secondary | ICD-10-CM | POA: Diagnosis present

## 2022-06-13 DIAGNOSIS — Z953 Presence of xenogenic heart valve: Secondary | ICD-10-CM

## 2022-06-13 DIAGNOSIS — Z66 Do not resuscitate: Secondary | ICD-10-CM | POA: Diagnosis not present

## 2022-06-13 DIAGNOSIS — J9622 Acute and chronic respiratory failure with hypercapnia: Secondary | ICD-10-CM | POA: Diagnosis present

## 2022-06-13 DIAGNOSIS — I10 Essential (primary) hypertension: Secondary | ICD-10-CM | POA: Diagnosis not present

## 2022-06-13 DIAGNOSIS — G8929 Other chronic pain: Secondary | ICD-10-CM | POA: Diagnosis present

## 2022-06-13 DIAGNOSIS — Z8571 Personal history of Hodgkin lymphoma: Secondary | ICD-10-CM

## 2022-06-13 DIAGNOSIS — I2510 Atherosclerotic heart disease of native coronary artery without angina pectoris: Secondary | ICD-10-CM | POA: Diagnosis present

## 2022-06-13 DIAGNOSIS — J9621 Acute and chronic respiratory failure with hypoxia: Secondary | ICD-10-CM | POA: Diagnosis present

## 2022-06-13 DIAGNOSIS — Y831 Surgical operation with implant of artificial internal device as the cause of abnormal reaction of the patient, or of later complication, without mention of misadventure at the time of the procedure: Secondary | ICD-10-CM | POA: Diagnosis present

## 2022-06-13 DIAGNOSIS — R519 Headache, unspecified: Secondary | ICD-10-CM | POA: Diagnosis present

## 2022-06-13 DIAGNOSIS — I5033 Acute on chronic diastolic (congestive) heart failure: Secondary | ICD-10-CM

## 2022-06-13 DIAGNOSIS — E039 Hypothyroidism, unspecified: Secondary | ICD-10-CM | POA: Diagnosis not present

## 2022-06-13 DIAGNOSIS — I878 Other specified disorders of veins: Secondary | ICD-10-CM | POA: Diagnosis present

## 2022-06-13 DIAGNOSIS — Z7982 Long term (current) use of aspirin: Secondary | ICD-10-CM

## 2022-06-13 DIAGNOSIS — Z952 Presence of prosthetic heart valve: Secondary | ICD-10-CM

## 2022-06-13 DIAGNOSIS — Z951 Presence of aortocoronary bypass graft: Secondary | ICD-10-CM

## 2022-06-13 DIAGNOSIS — F32A Depression, unspecified: Secondary | ICD-10-CM | POA: Diagnosis present

## 2022-06-13 DIAGNOSIS — E890 Postprocedural hypothyroidism: Secondary | ICD-10-CM | POA: Diagnosis present

## 2022-06-13 DIAGNOSIS — Z79890 Hormone replacement therapy (postmenopausal): Secondary | ICD-10-CM

## 2022-06-13 DIAGNOSIS — T82857A Stenosis of cardiac prosthetic devices, implants and grafts, initial encounter: Secondary | ICD-10-CM | POA: Diagnosis present

## 2022-06-13 DIAGNOSIS — J9601 Acute respiratory failure with hypoxia: Secondary | ICD-10-CM | POA: Diagnosis present

## 2022-06-13 DIAGNOSIS — I447 Left bundle-branch block, unspecified: Secondary | ICD-10-CM | POA: Diagnosis present

## 2022-06-13 DIAGNOSIS — B970 Adenovirus as the cause of diseases classified elsewhere: Secondary | ICD-10-CM | POA: Diagnosis present

## 2022-06-13 DIAGNOSIS — I2729 Other secondary pulmonary hypertension: Secondary | ICD-10-CM | POA: Diagnosis present

## 2022-06-13 DIAGNOSIS — Z6822 Body mass index (BMI) 22.0-22.9, adult: Secondary | ICD-10-CM

## 2022-06-13 DIAGNOSIS — I5020 Unspecified systolic (congestive) heart failure: Secondary | ICD-10-CM | POA: Diagnosis not present

## 2022-06-13 DIAGNOSIS — K219 Gastro-esophageal reflux disease without esophagitis: Secondary | ICD-10-CM | POA: Diagnosis not present

## 2022-06-13 DIAGNOSIS — E119 Type 2 diabetes mellitus without complications: Secondary | ICD-10-CM | POA: Diagnosis present

## 2022-06-13 DIAGNOSIS — J439 Emphysema, unspecified: Secondary | ICD-10-CM | POA: Diagnosis present

## 2022-06-13 DIAGNOSIS — E7800 Pure hypercholesterolemia, unspecified: Secondary | ICD-10-CM | POA: Diagnosis present

## 2022-06-13 DIAGNOSIS — Z789 Other specified health status: Secondary | ICD-10-CM

## 2022-06-13 DIAGNOSIS — Z9081 Acquired absence of spleen: Secondary | ICD-10-CM

## 2022-06-13 DIAGNOSIS — F1121 Opioid dependence, in remission: Secondary | ICD-10-CM | POA: Diagnosis present

## 2022-06-13 DIAGNOSIS — R110 Nausea: Secondary | ICD-10-CM | POA: Diagnosis not present

## 2022-06-13 DIAGNOSIS — Z20822 Contact with and (suspected) exposure to covid-19: Secondary | ICD-10-CM | POA: Diagnosis present

## 2022-06-13 DIAGNOSIS — Z515 Encounter for palliative care: Secondary | ICD-10-CM | POA: Diagnosis not present

## 2022-06-13 DIAGNOSIS — J9690 Respiratory failure, unspecified, unspecified whether with hypoxia or hypercapnia: Secondary | ICD-10-CM | POA: Diagnosis present

## 2022-06-13 DIAGNOSIS — Z79899 Other long term (current) drug therapy: Secondary | ICD-10-CM

## 2022-06-13 DIAGNOSIS — Z825 Family history of asthma and other chronic lower respiratory diseases: Secondary | ICD-10-CM

## 2022-06-13 LAB — PROTIME-INR
INR: 1.1 (ref 0.8–1.2)
Prothrombin Time: 14 seconds (ref 11.4–15.2)

## 2022-06-13 LAB — BASIC METABOLIC PANEL
Anion gap: 9 (ref 5–15)
BUN: 9 mg/dL (ref 6–20)
CO2: 28 mmol/L (ref 22–32)
Calcium: 8.6 mg/dL — ABNORMAL LOW (ref 8.9–10.3)
Chloride: 96 mmol/L — ABNORMAL LOW (ref 98–111)
Creatinine, Ser: 0.58 mg/dL — ABNORMAL LOW (ref 0.61–1.24)
GFR, Estimated: >60 mL/min (ref >60)
Glucose, Bld: 135 mg/dL — ABNORMAL HIGH (ref 70–99)
Potassium: 3.5 mmol/L (ref 3.5–5.1)
Sodium: 133 mmol/L — ABNORMAL LOW (ref 135–145)

## 2022-06-13 LAB — CBC WITH DIFFERENTIAL/PLATELET
Abs Immature Granulocytes: 0.06 10*3/uL (ref 0.00–0.07)
Basophils Absolute: 0.1 10*3/uL (ref 0.0–0.1)
Basophils Relative: 0 %
Eosinophils Absolute: 0 10*3/uL (ref 0.0–0.5)
Eosinophils Relative: 0 %
HCT: 44.3 % (ref 39.0–52.0)
Hemoglobin: 14.1 g/dL (ref 13.0–17.0)
Immature Granulocytes: 0 %
Lymphocytes Relative: 7 %
Lymphs Abs: 1 10*3/uL (ref 0.7–4.0)
MCH: 28.8 pg (ref 26.0–34.0)
MCHC: 31.8 g/dL (ref 30.0–36.0)
MCV: 90.6 fL (ref 80.0–100.0)
Monocytes Absolute: 1.9 10*3/uL — ABNORMAL HIGH (ref 0.1–1.0)
Monocytes Relative: 13 %
Neutro Abs: 11.1 10*3/uL — ABNORMAL HIGH (ref 1.7–7.7)
Neutrophils Relative %: 80 %
Platelets: 221 10*3/uL (ref 150–400)
RBC: 4.89 MIL/uL (ref 4.22–5.81)
RDW: 18.3 % — ABNORMAL HIGH (ref 11.5–15.5)
WBC: 14.1 10*3/uL — ABNORMAL HIGH (ref 4.0–10.5)
nRBC: 0 % (ref 0.0–0.2)

## 2022-06-13 LAB — BRAIN NATRIURETIC PEPTIDE: B Natriuretic Peptide: 436.7 pg/mL — ABNORMAL HIGH (ref 0.0–100.0)

## 2022-06-13 LAB — SARS CORONAVIRUS 2 BY RT PCR: SARS Coronavirus 2 by RT PCR: NEGATIVE

## 2022-06-13 LAB — TROPONIN I (HIGH SENSITIVITY): Troponin I (High Sensitivity): 6 ng/L (ref <18)

## 2022-06-13 LAB — LACTIC ACID, PLASMA: Lactic Acid, Venous: 1.1 mmol/L (ref 0.5–1.9)

## 2022-06-13 MED ORDER — PANTOPRAZOLE SODIUM 40 MG PO TBEC
40.0000 mg | DELAYED_RELEASE_TABLET | Freq: Two times a day (BID) | ORAL | Status: DC
  Administered 2022-06-14 – 2022-06-16 (×6): 40 mg via ORAL
  Filled 2022-06-13 (×6): qty 1

## 2022-06-13 MED ORDER — SODIUM CHLORIDE 0.9 % IV SOLN
2.0000 g | INTRAVENOUS | Status: DC
  Administered 2022-06-14 – 2022-06-16 (×3): 2 g via INTRAVENOUS
  Filled 2022-06-13 (×3): qty 20

## 2022-06-13 MED ORDER — BUPRENORPHINE HCL-NALOXONE HCL 8-2 MG SL SUBL
1.0000 | SUBLINGUAL_TABLET | Freq: Every day | SUBLINGUAL | Status: DC
  Administered 2022-06-14: 1 via SUBLINGUAL
  Filled 2022-06-13: qty 1

## 2022-06-13 MED ORDER — UMECLIDINIUM BROMIDE 62.5 MCG/ACT IN AEPB
1.0000 | INHALATION_SPRAY | Freq: Every day | RESPIRATORY_TRACT | Status: DC
  Filled 2022-06-13: qty 7

## 2022-06-13 MED ORDER — ATORVASTATIN CALCIUM 10 MG PO TABS
20.0000 mg | ORAL_TABLET | Freq: Every day | ORAL | Status: DC
  Administered 2022-06-14 – 2022-06-15 (×2): 20 mg via ORAL
  Filled 2022-06-13 (×2): qty 2

## 2022-06-13 MED ORDER — BUTALBITAL-APAP-CAFFEINE 50-325-40 MG PO TABS: 1.0000 | ORAL_TABLET | Freq: Four times a day (QID) | ORAL | Status: DC | PRN

## 2022-06-13 MED ORDER — INSULIN ASPART 100 UNIT/ML IJ SOLN
0.0000 [IU] | Freq: Three times a day (TID) | INTRAMUSCULAR | Status: DC
  Administered 2022-06-14: 2 [IU] via SUBCUTANEOUS
  Administered 2022-06-14 – 2022-06-15 (×3): 1 [IU] via SUBCUTANEOUS
  Administered 2022-06-15: 2 [IU] via SUBCUTANEOUS
  Administered 2022-06-15: 1 [IU] via SUBCUTANEOUS
  Administered 2022-06-16: 2 [IU] via SUBCUTANEOUS
  Administered 2022-06-17: 1 [IU] via SUBCUTANEOUS
  Administered 2022-06-17: 2 [IU] via SUBCUTANEOUS
  Filled 2022-06-13: qty 0.09

## 2022-06-13 MED ORDER — SODIUM CHLORIDE 0.9 % IV SOLN
500.0000 mg | Freq: Once | INTRAVENOUS | Status: AC
  Administered 2022-06-13: 500 mg via INTRAVENOUS
  Filled 2022-06-13: qty 5

## 2022-06-13 MED ORDER — ACETAMINOPHEN 325 MG PO TABS
650.0000 mg | ORAL_TABLET | Freq: Four times a day (QID) | ORAL | Status: DC | PRN
  Administered 2022-06-14 – 2022-06-15 (×2): 650 mg via ORAL
  Filled 2022-06-13 (×3): qty 2

## 2022-06-13 MED ORDER — SODIUM CHLORIDE 0.9% FLUSH
3.0000 mL | Freq: Two times a day (BID) | INTRAVENOUS | Status: DC
  Administered 2022-06-14 – 2022-06-17 (×8): 3 mL via INTRAVENOUS

## 2022-06-13 MED ORDER — SERTRALINE HCL 100 MG PO TABS
200.0000 mg | ORAL_TABLET | Freq: Every day | ORAL | Status: DC
  Administered 2022-06-14 – 2022-06-16 (×4): 200 mg via ORAL
  Filled 2022-06-13: qty 2
  Filled 2022-06-13: qty 4
  Filled 2022-06-13 (×2): qty 2

## 2022-06-13 MED ORDER — ENOXAPARIN SODIUM 40 MG/0.4ML IJ SOSY
40.0000 mg | PREFILLED_SYRINGE | INTRAMUSCULAR | Status: DC
  Administered 2022-06-14 – 2022-06-17 (×4): 40 mg via SUBCUTANEOUS
  Filled 2022-06-13 (×4): qty 0.4

## 2022-06-13 MED ORDER — IPRATROPIUM BROMIDE 0.02 % IN SOLN: 0.5000 mg | Freq: Four times a day (QID) | RESPIRATORY_TRACT | Status: DC

## 2022-06-13 MED ORDER — TORSEMIDE 20 MG PO TABS
40.0000 mg | ORAL_TABLET | Freq: Every day | ORAL | Status: DC
  Filled 2022-06-13: qty 2

## 2022-06-13 MED ORDER — ASPIRIN 81 MG PO TBEC
81.0000 mg | DELAYED_RELEASE_TABLET | Freq: Every day | ORAL | Status: DC
  Administered 2022-06-14 – 2022-06-15 (×2): 81 mg via ORAL
  Filled 2022-06-13 (×2): qty 1

## 2022-06-13 MED ORDER — POLYETHYLENE GLYCOL 3350 17 G PO PACK: 17.0000 g | PACK | Freq: Every day | ORAL | Status: DC | PRN

## 2022-06-13 MED ORDER — METOPROLOL TARTRATE 25 MG PO TABS
50.0000 mg | ORAL_TABLET | Freq: Two times a day (BID) | ORAL | Status: DC
  Administered 2022-06-14 – 2022-06-15 (×4): 50 mg via ORAL
  Filled 2022-06-13 (×4): qty 2

## 2022-06-13 MED ORDER — IPRATROPIUM-ALBUTEROL 0.5-2.5 (3) MG/3ML IN SOLN
3.0000 mL | Freq: Once | RESPIRATORY_TRACT | Status: AC
  Administered 2022-06-13: 3 mL via RESPIRATORY_TRACT
  Filled 2022-06-13: qty 3

## 2022-06-13 MED ORDER — SODIUM CHLORIDE 0.9 % IV SOLN
500.0000 mg | INTRAVENOUS | Status: DC
  Administered 2022-06-14 – 2022-06-16 (×3): 500 mg via INTRAVENOUS
  Filled 2022-06-13 (×3): qty 5

## 2022-06-13 MED ORDER — FUROSEMIDE 10 MG/ML IJ SOLN: 40.0000 mg | Freq: Two times a day (BID) | INTRAMUSCULAR | Status: DC

## 2022-06-13 MED ORDER — SODIUM CHLORIDE 0.9 % IV SOLN
1.0000 g | Freq: Once | INTRAVENOUS | Status: AC
  Administered 2022-06-13: 1 g via INTRAVENOUS
  Filled 2022-06-13: qty 10

## 2022-06-13 MED ORDER — LEVOTHYROXINE SODIUM 75 MCG PO TABS
175.0000 ug | ORAL_TABLET | Freq: Every day | ORAL | Status: DC
  Administered 2022-06-14 – 2022-06-17 (×3): 175 ug via ORAL
  Filled 2022-06-13 (×4): qty 1

## 2022-06-13 MED ORDER — FLUTICASONE FUROATE-VILANTEROL 100-25 MCG/ACT IN AEPB
1.0000 | INHALATION_SPRAY | Freq: Every day | RESPIRATORY_TRACT | Status: DC
  Filled 2022-06-13: qty 28

## 2022-06-13 MED ORDER — ACETAMINOPHEN 650 MG RE SUPP: 650.0000 mg | Freq: Four times a day (QID) | RECTAL | Status: DC | PRN

## 2022-06-13 MED ORDER — BUTALBITAL-APAP-CAFFEINE 50-325-40 MG PO TABS
1.0000 | ORAL_TABLET | Freq: Once | ORAL | Status: AC
  Administered 2022-06-13: 1 via ORAL
  Filled 2022-06-13: qty 1

## 2022-06-13 MED ORDER — LOSARTAN POTASSIUM 50 MG PO TABS
25.0000 mg | ORAL_TABLET | Freq: Every day | ORAL | Status: DC
  Administered 2022-06-14 – 2022-06-15 (×2): 25 mg via ORAL
  Filled 2022-06-13 (×2): qty 1

## 2022-06-13 MED ORDER — ALBUTEROL SULFATE (2.5 MG/3ML) 0.083% IN NEBU
2.5000 mg | INHALATION_SOLUTION | RESPIRATORY_TRACT | Status: DC | PRN
  Administered 2022-06-14 – 2022-06-17 (×2): 2.5 mg via RESPIRATORY_TRACT
  Filled 2022-06-13 (×2): qty 3

## 2022-06-13 NOTE — ED Triage Notes (Signed)
Patient BIB [facility] c/o shob x 2 days.  Patient has history of COPD and CHF and has been non-compliant with medications d/t insurance.  Patient also endorses headache x 1 week that has been relieved by fioricet. EMS reports patient was 67% on RA and has been 89% on 5L [place].  Patient normally wears 2 L [place] at night but has been having to wear it during the day recently.   138/70 HR 100 89% on 5L [place] 151 CBG RR 20

## 2022-06-13 NOTE — ED Notes (Signed)
Patient and visitor updated on plan of care

## 2022-06-13 NOTE — H&P (Signed)
History and Physical   Henry Sheppard OFB:[id number] DOB: 06-05-1966 DOA: 06/14/2022  PCP: Lilian Coma., MD   Patient coming from: Home  Chief Complaint: Shortness of breath  HPI: Henry Sheppard is a 56 y.o. male with medical history significant of anemia, pulmonary hypertension, CHF, hyperlipidemia, status post aortic valve replacement x2, hypertension, CAD, Hodgkin's lymphoma, COPD, chronic nightly oxygen use presenting with shortness of breath.  Patient reports 2 days of worsening shortness of breath.  Is on baseline 2 L at night.  He states he has taking his medication as rpescribed.  Has had steadily increasing shortness of breath especially on exertion became severe enough today he called EMS.  Found to be saturating 67% on room air and improved to 89% on 5 L.  Also reporting productive cough and intermittent headaches.  Reports subjective Lee feeling hot and cold but has not measured his temperature.  Also reporting some abdominal pain related to history of hernia.  He denies chest pain, constipation, diarrhea, nausea, vomiting.  ED Course: Vital signs in the ED significant for respiratory rate in the 20s, requiring 5 L to 6 L to maintain saturations.  Lab work-up included BMP with sodium 133, chloride 96, glucose 135, calcium 8.6.  CBC with leukocytosis to 14.1.  PT and INR within normal limits.  Lactic acid normal.  Troponin negative with repeat pending.  BNP elevated to 430.  COVID screening negative.  Blood cultures pending.  Chest x-ray showed cardiomegaly with increased vascular congestion, emphysema changes, patchy bilateral groundglass opacities suspicious for pneumonia.  Patient received Fioricet, ceftriaxone, azithromycin, DuoNeb in the ED.  Review of Systems: As per HPI otherwise all other systems reviewed and are negative.  Past Medical History:  Diagnosis Date   Anxiety    Arthritis    COPD (chronic obstructive pulmonary disease) ([place])    Depression    Dyspnea     H/O colonoscopy 2018   NORMAL   H/O echocardiogram 10/07/2018   MODERATE TO SEVERE  AORTIC VALVE STENOSIS..EF 40-50%   H/O endoscopy 2018   Heart murmur    Hodgkins lymphoma ([place])    in remission   Hyperlipidemia, group A    Hypertension, benign    Left bundle branch block    Narcotic dependence, in remission ([place])    Pneumonia     Past Surgical History:  Procedure Laterality Date   AORTIC VALVE REPLACEMENT     21 mm pericardial tissue valve 2011 [place]   ASCENDING AORTIC ROOT REPLACEMENT N/A 07/29/2019   Procedure: ASCENDING AORTIC PORCINE ROOT REPLACEMENT USING MEDTRONIC FREESTYLE AORTIC ROOT SIZE 35m and HEMOSHIELD STRAIGHT PLATINUM GRAFT SIZE 24;  Surgeon: BGaye Pollack MD;  [facility]  Service: Open Heart Surgery;  Laterality: N/A;   CARDIAC CATHETERIZATION     12/22/18: 40% proximal RCA, normal LM, LAD, LCX   CARDIAC VALVE REPLACEMENT     AVR pericardial tissue valve 2011 ([place])   collarbone surgery Right    CORONARY ARTERY BYPASS GRAFT     lymph angiogram     when pt.  was 17 yrs. old   RIGHT/LEFT HEART CATH AND CORONARY ANGIOGRAPHY N/A 12/22/2018   Procedure: RIGHT/LEFT HEART CATH AND CORONARY ANGIOGRAPHY;  Surgeon: PNigel Mormon MD;  [facility];  Service: Cardiovascular;  Laterality: N/A;   SPLENECTOMY     STERNAL WIRES REMOVAL N/A 08/17/2021   Procedure: STERNAL WIRES REMOVAL;  Surgeon: BGaye Pollack MD;  [facility]  Service: Thoracic;  Laterality: N/A;   TEE WITHOUT CARDIOVERSION N/A 12/22/2018   Procedure: TRANSESOPHAGEAL ECHOCARDIOGRAM (TEE);  Surgeon: Nigel Mormon, MD;  Location[facility] ENDOSCOPY;  Service: Cardiovascular;  Laterality: N/A;   TEE WITHOUT CARDIOVERSION N/A 07/29/2019   Procedure: TRANSESOPHAGEAL ECHOCARDIOGRAM (TEE);  Surgeon: [name], MD;  Location: [facility];  Service: Open Heart Surgery;  Laterality: N/A;   TOTAL THYROIDECTOMY      Social History  reports that he has been smoking cigarettes.  He has a 68.00 pack-year smoking history. He has never used smokeless tobacco. He reports that he does not currently use alcohol.  Drug: Marijuana.  No Known Allergies  Family History  Problem Relation Age of Onset   Cancer Father    Cancer Mother    Asthma Brother    Asthma Brother    Lung disease Neg Hx   Reviewed on admission  Prior to Admission medications   Medication Sig Start Date End Date Taking? Authorizing Provider  albuterol (VENTOLIN HFA) 108 (90 Base) MCG/ACT inhaler Inhale 2 puffs into the lungs every 6 (six) hours as needed (wheezing/shortness of breath). 03/08/20   Garner Nash, DO  aspirin EC 81 MG tablet Take 1 tablet (81 mg total) by mouth daily. 10/22/19   Patwardhan, Reynold Bowen, MD  atorvastatin (LIPITOR) 40 MG tablet Take 20 mg by mouth daily.    [provider]  Budeson-Glycopyrrol-Formoterol (BREZTRI AEROSPHERE) 160-9-4.8 MCG/ACT AERO Inhale 2 puffs into the lungs in the morning and at bedtime. 03/08/20   Icard, Octavio Graves, DO  butalbital-acetaminophen-caffeine (FIORICET) 50-325-40 MG tablet Take 1 tablet by mouth every 6 (six) hours as needed. 05/29/22   [provider]  diazepam (VALIUM) 5 MG tablet Take 5 mg by mouth 2 (two) times daily as needed for anxiety. 12/03/19   [provider]  fluticasone (FLOVENT HFA) 110 MCG/ACT inhaler Inhale 2 puffs into the lungs 2 (two) times daily. 03/20/20   [provider]  folic acid (FOLVITE) 891 MCG tablet Take 400 mcg by mouth daily. 08/28/20   [provider]  ibuprofen (ADVIL) 800 MG tablet Take 800 mg by mouth every 8 (eight) hours as needed for moderate pain. Patient not taking: Reported on 12/12/2021 12/03/19   [provider]  levothyroxine (SYNTHROID) 175 MCG tablet Take 175 mcg by mouth daily before breakfast. 02/12/21   [provider]  losartan (COZAAR) 25 MG tablet Take 25 mg by mouth at bedtime.  05/27/18   [provider]  meloxicam (MOBIC) 15 MG  tablet Take 15 mg by mouth daily as needed for pain. 02/20/21   [provider]  metFORMIN (GLUCOPHAGE) 500 MG tablet Take 500 mg by mouth 2 (two) times daily. 05/01/22   [provider]  metoprolol tartrate (LOPRESSOR) 50 MG tablet Take 50 mg by mouth 2 (two) times daily. 02/07/21   [provider]  MOVANTIK 25 MG TABS tablet Take 25 mg by mouth daily. 02/11/21   [provider]  OXYGEN Inhale 3 L into the lungs at bedtime.    [provider]  pantoprazole (PROTONIX) 40 MG tablet Take 40 mg by mouth 2 (two) times daily. 01/24/21   [provider]  potassium chloride (MICRO-K) 10 MEQ CR capsule Take 10 mEq by mouth 2 (two) times daily. 06/28/19   [provider]  predniSONE (DELTASONE) 20 MG tablet Take 60 mg by mouth daily. Patient not taking: Reported on 12/12/2021 08/09/21   [provider]  sertraline (ZOLOFT)  100 MG tablet Take 200 mg by mouth at bedtime. 05/05/18 09/22/21  [provider]  SUBOXONE 8-2 MG FILM Place 1 Film under the tongue in the morning, at noon, and at bedtime. 07/21/18   [provider]  SUMAtriptan (IMITREX) 50 MG tablet Take by mouth. 05/29/22   [provider]  testosterone cypionate (DEPOTESTOSTERONE CYPIONATE) 200 MG/ML injection Inject 0.5 mLs into the muscle as directed. Every 2 weeks 04/27/22   [provider]  torsemide (DEMADEX) 20 MG tablet TAKE 2 TABLETS BY MOUTH EVERY DAY 03/11/22   Adrian Prows, MD  TRELEGY ELLIPTA 100-62.5-25 MCG/ACT AEPB Take 1 puff by mouth daily. 05/17/22   [provider]    Physical Exam: Vitals:   06/09/2022 2145 05/18/2022 2200 06/12/2022 2215 06/05/2022 2230  BP: (!) 85/63 116/67 114/68 (!) 112/57  Pulse: 84 79 79 87  Resp: (!) 21 (!) 23 (!) 23 (!) 21  Temp:      TempSrc:      SpO2: 92% 91% 92% (!) 89%  Weight:      Height:        Physical Exam Constitutional:      General: He is not in acute distress.    Appearance: Normal  appearance.  HENT:     Head: Normocephalic and atraumatic.     Mouth/Throat:     Mouth: Mucous membranes are moist.     Pharynx: Oropharynx is clear.  Eyes:     Extraocular Movements: Extraocular movements intact.     Pupils: Pupils are equal, round, and reactive to light.  Cardiovascular:     Rate and Rhythm: Normal rate and regular rhythm.     Pulses: Normal pulses.     Heart sounds: Normal heart sounds.  Pulmonary:     Effort: Pulmonary effort is normal. No respiratory distress.     Breath sounds: Wheezing, rhonchi and rales present.  Abdominal:     General: Bowel sounds are normal. There is no distension.     Palpations: Abdomen is soft.     Tenderness: There is no abdominal tenderness.  Musculoskeletal:        General: No swelling or deformity.  Skin:    General: Skin is warm and dry.  Neurological:     General: No focal deficit present.     Mental Status: Mental status is at baseline.    Labs on Admission: I have personally reviewed following labs and imaging studies  CBC: Recent Labs  Lab 05/22/2022 2040  WBC 14.1*  NEUTROABS 11.1*  HGB 14.1  HCT 44.3  MCV 90.6  PLT 643    Basic Metabolic Panel: Recent Labs  Lab 05/31/2022 2040  NA 133*  K 3.5  CL 96*  CO2 28  GLUCOSE 135*  BUN 9  CREATININE 0.58*  CALCIUM 8.6*    GFR: Estimated Creatinine Clearance: 111.1 mL/min (A) (by C-G formula based on SCr of 0.58 mg/dL (L)).  Liver Function Tests: No results for input(s): "AST", "ALT", "ALKPHOS", "BILITOT", "PROT", "ALBUMIN" in the last 168 hours.  Urine analysis:    Component Value Date/Time   COLORURINE YELLOW 07/27/2019 1140   APPEARANCEUR CLEAR 07/27/2019 1140   LABSPEC 1.027 07/27/2019 1140   PHURINE 6.0 07/27/2019 1140   GLUCOSEU NEGATIVE 07/27/2019 1140   HGBUR SMALL (A) 07/27/2019 1140   BILIRUBINUR NEGATIVE 07/27/2019 1140   KETONESUR NEGATIVE 07/27/2019 1140   PROTEINUR NEGATIVE 07/27/2019 1140   NITRITE NEGATIVE 07/27/2019 [place] 07/27/2019 1140  Radiological Exams on Admission: DG Chest Port 1 View  Result Date: 05/23/2022 CLINICAL DATA:  Short of breath EXAM: PORTABLE CHEST 1 VIEW COMPARISON:  08/27/2021, chest CT 02/19/2022 FINDINGS: Electronic device over left chest. Cardiomegaly with vascular congestion. Possible tiny pleural effusions. Patchy bilateral consolidations and ground-glass opacities. Aortic atherosclerosis. Surgical plate and fixating screws in the right clavicle IMPRESSION: 1. Cardiomegaly with vascular congestion and possible tiny pleural effusions. 2. Emphysema. Patchy bilateral ground-glass opacities and consolidations are suspect for bilateral pneumonia Electronically Signed   By: Donavan Foil M.D.   On: 06/10/2022 20:40    EKG: Independently reviewed.  Sinus rhythm at 87 bpm.  Left bundle branch block.  Upsloping J-point elevation in anterior leads likely repolarization abnormality.  PAC noted.  Upsloping J-point elevation is more pronounced from previous.  Assessment/Plan Principal Problem:   Acute on chronic respiratory failure with hypoxia (HCC) Active Problems:   Other secondary pulmonary hypertension (HCC)   HFrEF (heart failure with reduced ejection fraction) (HCC)   S/P AVR   Essential hypertension   Prosthetic aortic valve stenosis   Narcotic dependence, in remission ([place])   Hyperlipidemia, group A   Hodgkins lymphoma ([place])   Coronary artery disease involving native coronary artery of native heart without angina pectoris   Gastroesophageal reflux disease without esophagitis   Personal history of Hodgkin lymphoma   On home oxygen therapy   Type 2 diabetes mellitus without complication, without long-term current use of insulin (HCC)   Hypothyroidism   COPD with acute exacerbation (HCC)   Acute on chronic respiratory failure with hypoxia CHF exacerbation Pneumonia ?  COPD exacerbation Pulmonary hypertension > Patient presenting with shortness of breath  gradual worsening for 2 days.  > In the ED noted to have wheezing and rales.  BNP elevated to 430 consistent with CHF exacerbation vs heart strain (with hx of pHTN) as well as chest x-ray with degree of vascular congestion and cardiomegaly.  Leukocytosis to 14.1 as well as chest x-ray changes of patchy bilateral groundglass opacities consistent with pneumonia.  Given wheezing also suspect a degree of COPD exacerbation. > Last echo in 2020 with EF 72-09%, grade 2 diastolic dysfunction. > Requiring 5 to 6 L to maintain saturations in the ED.  No baseline oxygen during the day but does use 2 L at night at home. - Monitor on progressive unit - Continue with supplemental oxygen wean as tolerated For PNA - Continue with ceftriaxone azithromycin - Trend fever curve and WBC - Follow-up blood cultures For COPD - With persistent wheezing will start scheduled Atrovent and continue as needed albuterol - Hold off steroids for now - Replace home Trelegy with formulary Breo and Incruse For ? Mild CHF exacerbation - We will hold off on IV Lasix for now given lack of lower extremity edema which he states he does get during flareups as well as low normal blood pressures when seen. - Strict I's and O's, daily weights - Echocardiogram - Trend renal function electrolytes - Check magnesium - Continue home metoprolol, losartan, torsemide  Hyperlipidemia - Continue home atorvastatin  Hypertension - Continue losartan, metoprolol - On diuretic as above  Chronic headaches - Continue as needed Fioricet  CAD - Continue home ASA, atorvastatin, metoprolol  Status post aortic valve replacement with redo - Noted  Anxiety - Continue home sertraline  GERD - Continue PPI  Hypothyroidism - Continue home Synthroid  Diabetes - SSI  Opiate use disorder in remission - Continue home Suboxone  History of Hodgkin's lymphoma -  Noted  DVT prophylaxis: Lovenox Code Status:   Full Family Communication:   None on admission Disposition Plan:   Patient is from:  Home  Anticipated DC to:  Home  Anticipated DC date:  2 to 4 days  Anticipated DC barriers: None  Consults called:  None Admission status:  Inpatient, progressive  Severity of Illness: The appropriate patient status for this patient is INPATIENT. Inpatient status is judged to be reasonable and necessary in order to provide the required intensity of service to ensure the patient's safety. The patient's presenting symptoms, physical exam findings, and initial radiographic and laboratory data in the context of their chronic comorbidities is felt to place them at high risk for further clinical deterioration. Furthermore, it is not anticipated that the patient will be medically stable for discharge from the hospital within 2 midnights of admission.   * I certify that at the point of admission it is my clinical judgment that the patient will require inpatient hospital care spanning beyond 2 midnights from the point of admission due to high intensity of service, high risk for further deterioration and high frequency of surveillance required.Marcelyn Bruins MD [facility]  How to contact the [facility] Attending or Consulting provider [place] or covering provider during after hours [place], for this patient?   Check the care team in [facility] and look for a) attending/consulting [facility] provider listed and b) the [facility] team listed Log into [email or web address] and use [place]'s universal password to access. If you do not have the password, please contact the hospital operator. Locate the [facility] provider you are looking for under [facility] and page to a number that you can be directly reached. If you still have difficulty reaching the provider, please page the [facility] (Director on Call) for the Hospitalists listed on amion for assistance.  06/16/2022, 11:56 PM

## 2022-06-13 NOTE — ED Provider Notes (Signed)
[facility] DEPT Provider Note   CSN: [id number] Arrival date & time: 06/17/2022  1925     History  Chief Complaint  Patient presents with   Shortness of Breath    Henry Sheppard is a 56 y.o. male.  He has significant lung disease including heart failure, aortic valve replacement, pulmonary hypertension and uses 2 L of nasal cannula oxygen at night.  He said for the last 2 days he has had increased shortness of breath during the day especially with any type of exertion.  Today while walking across the room he desatted into the 60s.  He has had a cough productive of some thick yellow sputum.  He has felt hot and cold but has not checked his temperature.  No chest pain.  Has some chronic abdominal pain that he says is related to a hernia.  He is a smoker.  The history is provided by the patient.  Shortness of Breath Severity:  Severe Onset quality:  Gradual Duration:  2 days Timing:  Constant Progression:  Worsening Chronicity:  New Context: activity   Relieved by:  Nothing Worsened by:  Activity Ineffective treatments:  Oxygen and diuretics Associated symptoms: abdominal pain, cough, fever, headaches, sputum production and wheezing   Associated symptoms: no chest pain, no hemoptysis and no sore throat   Risk factors: hx of cancer and tobacco use        Home Medications Prior to Admission medications   Medication Sig Start Date End Date Taking? Authorizing Provider  albuterol (VENTOLIN HFA) 108 (90 Base) MCG/ACT inhaler Inhale 2 puffs into the lungs every 6 (six) hours as needed (wheezing/shortness of breath). 03/08/20   June Leap L, DO  amoxicillin-clavulanate (AUGMENTIN) 875-125 MG tablet Take 1 tablet by mouth 2 (two) times daily. Patient not taking: Reported on 12/12/2021 08/13/21   [provider]  aspirin EC 81 MG tablet Take 1 tablet (81 mg total) by mouth daily. 10/22/19   Patwardhan, Reynold Bowen, MD  atorvastatin (LIPITOR) 40 MG  tablet Take 20 mg by mouth daily.    [provider]  Budeson-Glycopyrrol-Formoterol (BREZTRI AEROSPHERE) 160-9-4.8 MCG/ACT AERO Inhale 2 puffs into the lungs in the morning and at bedtime. 03/08/20   Icard, Octavio Graves, DO  diazepam (VALIUM) 5 MG tablet Take 5 mg by mouth 2 (two) times daily as needed for anxiety. 12/03/19   [provider]  fluticasone (FLOVENT HFA) 110 MCG/ACT inhaler Inhale 2 puffs into the lungs 2 (two) times daily. 03/20/20   [provider]  folic acid (FOLVITE) 409 MCG tablet Take 400 mcg by mouth daily. 08/28/20   [provider]  hydrOXYzine (ATARAX/VISTARIL) 25 MG tablet Take 25 mg by mouth every 6 (six) hours as needed (pain). Patient not taking: Reported on 12/12/2021 11/22/20   [provider]  ibuprofen (ADVIL) 800 MG tablet Take 800 mg by mouth every 8 (eight) hours as needed for moderate pain. Patient not taking: Reported on 12/12/2021 12/03/19   [provider]  levothyroxine (SYNTHROID) 175 MCG tablet Take 175 mcg by mouth daily before breakfast. 02/12/21   [provider]  losartan (COZAAR) 25 MG tablet Take 25 mg by mouth at bedtime.  05/27/18   [provider]  meloxicam (MOBIC) 15 MG tablet Take 15 mg by mouth daily as needed for pain. 02/20/21   [provider]  metoprolol tartrate (LOPRESSOR) 50 MG tablet Take 50 mg by mouth 2 (two) times daily. 02/07/21   [provider]  MOVANTIK 25 MG TABS tablet Take 25 mg by mouth daily. 02/11/21   [provider]  OXYGEN Inhale 3 L into the lungs at bedtime.    [provider]  pantoprazole (PROTONIX) 40 MG tablet Take 40 mg by mouth 2 (two) times daily. 01/24/21   [provider]  potassium chloride (MICRO-K) 10 MEQ CR capsule Take 10 mEq by mouth 2 (two) times daily. 06/28/19   [provider]  predniSONE (DELTASONE) 20 MG tablet Take 60 mg by mouth daily. Patient not taking: Reported on 12/12/2021 08/09/21    [provider]  sertraline (ZOLOFT) 100 MG tablet Take 200 mg by mouth at bedtime. 05/05/18 09/22/21  [provider]  SUBOXONE 8-2 MG FILM Place 1 Film under the tongue in the morning, at noon, and at bedtime. 07/21/18   [provider]  torsemide (DEMADEX) 20 MG tablet TAKE 2 TABLETS BY MOUTH EVERY DAY 03/11/22   Adrian Prows, MD      Allergies    Patient has no known allergies.    Review of Systems   Review of Systems  Constitutional:  Positive for fever.  HENT:  Negative for sore throat.   Eyes:  Negative for visual disturbance.  Respiratory:  Positive for cough, sputum production, shortness of breath and wheezing. Negative for hemoptysis.   Cardiovascular:  Negative for chest pain.  Gastrointestinal:  Positive for abdominal pain.  Genitourinary:  Negative for dysuria.  Neurological:  Positive for headaches.    Physical Exam Updated Vital Signs BP 120/72   Pulse 91   Temp 100.1 F (37.8 C) (Oral)   Resp 20   Ht '5\' 11"'$  (1.803 m)   Wt 80.3 kg   SpO2 92%   BMI 24.69 kg/m  Physical Exam Vitals and nursing note reviewed.  Constitutional:      General: He is not in acute distress.    Appearance: He is well-developed.  HENT:     Head: Normocephalic and atraumatic.  Eyes:     Conjunctiva/sclera: Conjunctivae normal.  Cardiovascular:     Rate and Rhythm: Normal rate and regular rhythm.     Heart sounds: No murmur heard. Pulmonary:     Effort: Accessory muscle usage present. No respiratory distress.     Breath sounds: Rhonchi present.  Abdominal:     Palpations: Abdomen is soft.     Tenderness: There is no abdominal tenderness.  Musculoskeletal:        General: No swelling.     Cervical back: Neck supple.     Right lower leg: No tenderness. No edema.     Left lower leg: No tenderness. No edema.  Skin:    General: Skin is warm and dry.     Capillary Refill: Capillary refill takes less than 2 seconds.  Neurological:     General: No focal  deficit present.     Mental Status: He is alert.     ED Results / Procedures / Treatments   Labs (all labs ordered are listed, but only abnormal results are displayed) Labs Reviewed  BASIC METABOLIC PANEL - Abnormal; Notable for the following components:      Result Value   Sodium 133 (*)    Chloride 96 (*)    Glucose, Bld 135 (*)    Creatinine, Ser 0.58 (*)    Calcium 8.6 (*)    All other components within normal limits  BRAIN NATRIURETIC PEPTIDE - Abnormal; Notable for the following components:   B Natriuretic Peptide  436.7 (*)    All other components within normal limits  CBC WITH DIFFERENTIAL/PLATELET - Abnormal; Notable for the following components:   WBC 14.1 (*)    RDW 18.3 (*)    Neutro Abs 11.1 (*)    Monocytes Absolute 1.9 (*)    All other components within normal limits  COMPREHENSIVE METABOLIC PANEL - Abnormal; Notable for the following components:   Glucose, Bld 129 (*)    Creatinine, Ser 0.53 (*)    Albumin 3.4 (*)    All other components within normal limits  CBC - Abnormal; Notable for the following components:   WBC 15.9 (*)    RDW 18.0 (*)    All other components within normal limits  CBG MONITORING, ED - Abnormal; Notable for the following components:   Glucose-Capillary 133 (*)    All other components within normal limits  CULTURE, BLOOD (ROUTINE X 2)  CULTURE, BLOOD (ROUTINE X 2)  SARS CORONAVIRUS 2 BY RT PCR  RESPIRATORY PANEL BY PCR  MRSA NEXT GEN BY PCR, NASAL  LACTIC ACID, PLASMA  PROTIME-INR  HIV ANTIBODY (ROUTINE TESTING W REFLEX)  MAGNESIUM  TSH  TROPONIN I (HIGH SENSITIVITY)  TROPONIN I (HIGH SENSITIVITY)    EKG EKG Interpretation  Date/Time:  Thursday June 13 2022 20:40:20 EDT Ventricular Rate:  87 PR Interval:  153 QRS Duration: 158 QT Interval:  408 QTC Calculation: 491 R Axis:   82 Text Interpretation: Sinus rhythm Atrial premature complex Left bundle branch block increased t wave amplitude and ST changes from prior 9/22  Confirmed by Aletta Edouard [phone]) on 05/22/2022 9:07:37 PM  Radiology DG Chest Port 1 View  Result Date: 05/29/2022 CLINICAL DATA:  Short of breath EXAM: PORTABLE CHEST 1 VIEW COMPARISON:  08/27/2021, chest CT 02/19/2022 FINDINGS: Electronic device over left chest. Cardiomegaly with vascular congestion. Possible tiny pleural effusions. Patchy bilateral consolidations and ground-glass opacities. Aortic atherosclerosis. Surgical plate and fixating screws in the right clavicle IMPRESSION: 1. Cardiomegaly with vascular congestion and possible tiny pleural effusions. 2. Emphysema. Patchy bilateral ground-glass opacities and consolidations are suspect for bilateral pneumonia Electronically Signed   By: Donavan Foil M.D.   On: 05/23/2022 20:40    Procedures .Critical Care  Performed by: Hayden Rasmussen, MD Authorized by: Hayden Rasmussen, MD   Critical care provider statement:    Critical care time (minutes):  45   Critical care time was exclusive of:  Separately billable procedures and treating other patients   Critical care was necessary to treat or prevent imminent or life-threatening deterioration of the following conditions:  Respiratory failure   Critical care was time spent personally by me on the following activities:  Development of treatment plan with patient or surrogate, discussions with consultants, evaluation of patient's response to treatment, examination of patient, obtaining history from patient or surrogate, ordering and performing treatments and interventions, ordering and review of laboratory studies, ordering and review of radiographic studies, pulse oximetry, re-evaluation of patient's condition and review of old charts   I assumed direction of critical care for this patient from another provider in my specialty: no       Medications Ordered in ED Medications  aspirin EC tablet 81 mg (81 mg Oral Given 06/14/22 0825)  butalbital-acetaminophen-caffeine (FIORICET) 50-325-40  MG per tablet 1 tablet (has no administration in time range)  losartan (COZAAR) tablet 25 mg (25 mg Oral Given 06/14/22 0825)  atorvastatin (LIPITOR) tablet 20 mg (20 mg Oral Given 06/14/22 0824)  levothyroxine (SYNTHROID) tablet 175  mcg (175 mcg Oral Given 06/14/22 0655)  pantoprazole (PROTONIX) EC tablet 40 mg (40 mg Oral Given 06/14/22 0824)  enoxaparin (LOVENOX) injection 40 mg (40 mg Subcutaneous Given 06/14/22 0825)  cefTRIAXone (ROCEPHIN) 2 g in sodium chloride 0.9 % 100 mL IVPB (has no administration in time range)  azithromycin (ZITHROMAX) 500 mg in sodium chloride 0.9 % 250 mL IVPB (has no administration in time range)  albuterol (PROVENTIL) (2.5 MG/3ML) 0.083% nebulizer solution 2.5 mg (2.5 mg Nebulization Given 06/14/22 0429)  sodium chloride flush (NS) 0.9 % injection 3 mL (3 mLs Intravenous Not Given 06/03/2022 2337)  acetaminophen (TYLENOL) tablet 650 mg (has no administration in time range)    Or  acetaminophen (TYLENOL) suppository 650 mg (has no administration in time range)  polyethylene glycol (MIRALAX / GLYCOLAX) packet 17 g (has no administration in time range)  buprenorphine-naloxone (SUBOXONE) 8-2 mg per SL tablet 1 tablet (has no administration in time range)  sertraline (ZOLOFT) tablet 200 mg (200 mg Oral Given 06/14/22 0046)  metoprolol tartrate (LOPRESSOR) tablet 50 mg (50 mg Oral Given 06/14/22 0824)  insulin aspart (novoLOG) injection 0-9 Units (1 Units Subcutaneous Given 06/14/22 0845)  albuterol (PROVENTIL) (2.5 MG/3ML) 0.083% nebulizer solution 2.5 mg (2.5 mg Nebulization Given 06/14/22 0831)  furosemide (LASIX) injection 40 mg (40 mg Intravenous Given 06/14/22 0825)  arformoterol (BROVANA) nebulizer solution 15 mcg (15 mcg Nebulization Given 06/14/22 1018)  budesonide (PULMICORT) nebulizer solution 0.5 mg (0.5 mg Nebulization Given 06/14/22 1019)  revefenacin (YUPELRI) nebulizer solution 175 mcg (175 mcg Nebulization Not Given 06/14/22 0850)  Chlorhexidine Gluconate Cloth 2 %  PADS 6 each (6 each Topical Given 06/14/22 1046)  Oral care mouth rinse (has no administration in time range)  ipratropium-albuterol (DUONEB) 0.5-2.5 (3) MG/3ML nebulizer solution 3 mL (3 mLs Nebulization Given 06/14/2022 2011)  cefTRIAXone (ROCEPHIN) 1 g in sodium chloride 0.9 % 100 mL IVPB (0 g Intravenous Stopped 06/08/2022 2153)  azithromycin (ZITHROMAX) 500 mg in sodium chloride 0.9 % 250 mL IVPB (0 mg Intravenous Stopped 06/06/2022 2209)  butalbital-acetaminophen-caffeine (FIORICET) 50-325-40 MG per tablet 1 tablet (1 tablet Oral Given 06/15/2022 2105)    ED Course/ Medical Decision Making/ A&P Clinical Course as of 06/14/22 1129  Thu Jun 13, 2022  2044 Chest x-ray interpreted by me as possible multifocal pneumonia and failure.  Awaiting radiology reading. [MB]  2044 DG Chest Port 1 View [MB]  2231 Discussed with Dr. Trilby Drummer [facility] hospitalist who will evaluate patient for admission. [MB]    Clinical Course User Index [MB] Hayden Rasmussen, MD                           Medical Decision Making Amount and/or Complexity of Data Reviewed Labs: ordered. Radiology: ordered. Decision-making details documented in ED Course.  Risk Prescription drug management. Decision regarding hospitalization.  This patient complains of cough shortness of breath hypoxia; this involves an extensive number of treatment Options and is a complaint that carries with it a high risk of complications and morbidity. The differential includes CHF, COPD, pneumonia, COVID, flu, anemia, ACS, PE thorax  I ordered, reviewed and interpreted labs, which included CBC with elevated white count normal hemoglobin, chemistries and LFTs fairly unremarkable, BNP elevated, troponins flat, COVID-negative I ordered medication IV antibiotics breathing treatments and reviewed PMP when indicated. I ordered imaging studies which included chest x-ray and I independently    visualized and interpreted imaging which showed multifocal pneumonia and  CHF Additional history  obtained from EMS Previous records obtained and reviewed no recent admissions I consulted [facility] hospitalist Dr. Trilby Drummer and discussed lab and imaging findings and discussed disposition.  Cardiac monitoring reviewed, normal sinus rhythm Social determinants considered, ongoing tobacco use Critical Interventions: Management of patient's hypoxia with oxygen and antibiotics  After the interventions stated above, I reevaluated the patient and found patient still be tachypneic and symptomatic although appearing comfortable on 5 L Admission and further testing considered, patient will need admission to the hospital for further management.  Patient in agreement with plan for admission.          Final Clinical Impression(s) / ED Diagnoses Final diagnoses:  Multifocal pneumonia  Acute respiratory failure with hypoxia [facility])    Rx / DC Orders ED Discharge Orders     None         Hayden Rasmussen, MD 06/14/22 1133

## 2022-06-14 ENCOUNTER — Encounter (HOSPITAL_COMMUNITY): Payer: Self-pay | Admitting: Internal Medicine

## 2022-06-14 ENCOUNTER — Inpatient Hospital Stay (HOSPITAL_COMMUNITY): Payer: Medicare Other

## 2022-06-14 DIAGNOSIS — J9621 Acute and chronic respiratory failure with hypoxia: Secondary | ICD-10-CM

## 2022-06-14 DIAGNOSIS — J9690 Respiratory failure, unspecified, unspecified whether with hypoxia or hypercapnia: Secondary | ICD-10-CM | POA: Diagnosis present

## 2022-06-14 LAB — RESPIRATORY PANEL BY PCR

## 2022-06-14 LAB — COMPREHENSIVE METABOLIC PANEL
ALT: 13 U/L (ref 0–44)
AST: 26 U/L (ref 15–41)
Albumin: 3.4 g/dL — ABNORMAL LOW (ref 3.5–5.0)
Alkaline Phosphatase: 107 U/L (ref 38–126)
Anion gap: 8 (ref 5–15)
BUN: 10 mg/dL (ref 6–20)
CO2: 29 mmol/L (ref 22–32)
Calcium: 9.2 mg/dL (ref 8.9–10.3)
Chloride: 101 mmol/L (ref 98–111)
Creatinine, Ser: 0.53 mg/dL — ABNORMAL LOW (ref 0.61–1.24)
GFR, Estimated: >60 mL/min (ref >60)
Glucose, Bld: 129 mg/dL — ABNORMAL HIGH (ref 70–99)
Potassium: 3.8 mmol/L (ref 3.5–5.1)
Sodium: 138 mmol/L (ref 135–145)
Total Bilirubin: 0.9 mg/dL (ref 0.3–1.2)
Total Protein: 7.5 g/dL (ref 6.5–8.1)

## 2022-06-14 LAB — TROPONIN I (HIGH SENSITIVITY): Troponin I (High Sensitivity): 6 ng/L (ref <18)

## 2022-06-14 LAB — MRSA NEXT GEN BY PCR, NASAL: MRSA by PCR Next Gen: NOT DETECTED

## 2022-06-14 LAB — CBC
HCT: 44 % (ref 39.0–52.0)
Hemoglobin: 14 g/dL (ref 13.0–17.0)
MCH: 28.9 pg (ref 26.0–34.0)
MCHC: 31.8 g/dL (ref 30.0–36.0)
MCV: 90.9 fL (ref 80.0–100.0)
Platelets: 218 10*3/uL (ref 150–400)
RBC: 4.84 MIL/uL (ref 4.22–5.81)
RDW: 18 % — ABNORMAL HIGH (ref 11.5–15.5)
WBC: 15.9 10*3/uL — ABNORMAL HIGH (ref 4.0–10.5)
nRBC: 0 % (ref 0.0–0.2)

## 2022-06-14 LAB — TSH: TSH: 3.674 u[IU]/mL (ref 0.350–4.500)

## 2022-06-14 LAB — PROCALCITONIN: Procalcitonin: <0.1 ng/mL

## 2022-06-14 LAB — CBG MONITORING, ED: Glucose-Capillary: 133 mg/dL — ABNORMAL HIGH (ref 70–99)

## 2022-06-14 LAB — GLUCOSE, CAPILLARY
Glucose-Capillary: 130 mg/dL — ABNORMAL HIGH (ref 70–99)
Glucose-Capillary: 150 mg/dL — ABNORMAL HIGH (ref 70–99)
Glucose-Capillary: 113 mg/dL — ABNORMAL HIGH (ref 70–99)
Glucose-Capillary: 162 mg/dL — ABNORMAL HIGH (ref 70–99)

## 2022-06-14 LAB — MAGNESIUM: Magnesium: 2 mg/dL (ref 1.7–2.4)

## 2022-06-14 LAB — HIV ANTIBODY (ROUTINE TESTING W REFLEX): HIV Screen 4th Generation wRfx: NONREACTIVE

## 2022-06-14 MED ORDER — BUPRENORPHINE HCL-NALOXONE HCL 8-2 MG SL SUBL
1.0000 | SUBLINGUAL_TABLET | Freq: Three times a day (TID) | SUBLINGUAL | Status: DC
  Administered 2022-06-14 – 2022-06-16 (×5): 1 via SUBLINGUAL
  Filled 2022-06-14 (×5): qty 1

## 2022-06-14 MED ORDER — ASPIRIN-ACETAMINOPHEN-CAFFEINE 250-250-65 MG PO TABS
1.0000 | ORAL_TABLET | Freq: Four times a day (QID) | ORAL | Status: DC | PRN
  Administered 2022-06-14 – 2022-06-17 (×3): 1 via ORAL
  Filled 2022-06-14 (×6): qty 1

## 2022-06-14 MED ORDER — FUROSEMIDE 10 MG/ML IJ SOLN
40.0000 mg | Freq: Two times a day (BID) | INTRAMUSCULAR | Status: DC
  Administered 2022-06-14 – 2022-06-15 (×3): 40 mg via INTRAVENOUS
  Filled 2022-06-14 (×3): qty 4

## 2022-06-14 MED ORDER — ALBUTEROL SULFATE (2.5 MG/3ML) 0.083% IN NEBU
2.5000 mg | INHALATION_SOLUTION | Freq: Three times a day (TID) | RESPIRATORY_TRACT | Status: DC
  Administered 2022-06-14 – 2022-06-17 (×11): 2.5 mg via RESPIRATORY_TRACT
  Filled 2022-06-14 (×13): qty 3

## 2022-06-14 MED ORDER — ARFORMOTEROL TARTRATE 15 MCG/2ML IN NEBU
15.0000 ug | INHALATION_SOLUTION | Freq: Two times a day (BID) | RESPIRATORY_TRACT | Status: DC
  Administered 2022-06-14 – 2022-06-17 (×7): 15 ug via RESPIRATORY_TRACT
  Filled 2022-06-14 (×9): qty 2

## 2022-06-14 MED ORDER — CHLORHEXIDINE GLUCONATE CLOTH 2 % EX PADS
6.0000 | MEDICATED_PAD | Freq: Every day | CUTANEOUS | Status: DC
  Administered 2022-06-14 – 2022-06-17 (×4): 6 via TOPICAL

## 2022-06-14 MED ORDER — ONDANSETRON HCL 4 MG/2ML IJ SOLN
4.0000 mg | Freq: Four times a day (QID) | INTRAMUSCULAR | Status: DC | PRN
  Administered 2022-06-15 – 2022-06-16 (×2): 4 mg via INTRAVENOUS
  Filled 2022-06-14 (×2): qty 2

## 2022-06-14 MED ORDER — DAPAGLIFLOZIN PROPANEDIOL 10 MG PO TABS
10.0000 mg | ORAL_TABLET | Freq: Every day | ORAL | Status: DC
  Administered 2022-06-16: 10 mg via ORAL
  Filled 2022-06-14 (×4): qty 1

## 2022-06-14 MED ORDER — DAPAGLIFLOZIN PROPANEDIOL 10 MG PO TABS
10.0000 mg | ORAL_TABLET | Freq: Every day | ORAL | Status: DC
  Filled 2022-06-14: qty 1

## 2022-06-14 MED ORDER — REVEFENACIN 175 MCG/3ML IN SOLN
175.0000 ug | Freq: Every day | RESPIRATORY_TRACT | Status: DC
  Administered 2022-06-15 – 2022-06-17 (×3): 175 ug via RESPIRATORY_TRACT
  Filled 2022-06-14 (×5): qty 3

## 2022-06-14 MED ORDER — ORAL CARE MOUTH RINSE: 15.0000 mL | OROMUCOSAL | Status: DC | PRN

## 2022-06-14 MED ORDER — BUDESONIDE 0.5 MG/2ML IN SUSP
0.5000 mg | Freq: Two times a day (BID) | RESPIRATORY_TRACT | Status: DC
  Administered 2022-06-14 – 2022-06-17 (×7): 0.5 mg via RESPIRATORY_TRACT
  Filled 2022-06-14 (×7): qty 2

## 2022-06-14 NOTE — Progress Notes (Signed)
Patient placed on BiPAP, comfortable at this time.

## 2022-06-14 NOTE — ED Notes (Signed)
RT called to place patient on bipap due to increased WOB

## 2022-06-14 NOTE — Progress Notes (Addendum)
  Echocardiogram 2D Echocardiogram has been performed. Dr. Virgina Jock paged at 12:21 pm   Darlina Sicilian M 06/14/2022, 12:09 PM

## 2022-06-14 NOTE — Progress Notes (Signed)
PROGRESS NOTE    Henry Sheppard  PZW:[id number] DOB: 25-Jan-1966 DOA: 06/02/2022 PCP: Lilian Coma., MD   Brief Narrative: 56 year old with past medical history significant for anemia, pulmonary hypertension, CHF per lipidemia, status post aortic valve replacement x2, hypertension, CAD, Hodgkin lymphoma, COPD, chronic nightly oxygen who presents complaining of worsening shortness of breath that is started 2 days prior to admission.  He was found to have oxygen saturation 67 on room air and improved to 89 on 5 L.  He reports productive cough and headaches.  Patient admitted with acute on chronic hypoxic respiratory failure, multifactorial secondary to pneumonia, COPD exacerbation and or heart failure exacerbation   Assessment & Plan:   Principal Problem:   Acute on chronic respiratory failure with hypoxia (HCC) Active Problems:   Other secondary pulmonary hypertension (HCC)   HFrEF (heart failure with reduced ejection fraction) (HCC)   S/P AVR   Essential hypertension   Prosthetic aortic valve stenosis   Narcotic dependence, in remission ([place])   Hyperlipidemia, group A   Hodgkins lymphoma ([place])   Coronary artery disease involving native coronary artery of native heart without angina pectoris   Gastroesophageal reflux disease without esophagitis   Personal history of Hodgkin lymphoma   On home oxygen therapy   Type 2 diabetes mellitus without complication, without long-term current use of insulin (HCC)   Hypothyroidism   COPD with acute exacerbation (HCC)   1-Acute on Chronic Hypoxic Respiratory Failure:  Acute systolic heart failure exacerbation Pneumonia COPD  -Patient presented with worsening shortness of breath, cough, leukocytosis, elevated BNP, chest x-ray patchy bilateral groundglass opacity consistent with pneumonia also degree of vascular congestion. -We will change torsemide to IV Lasix 40 mg IV twice daily.  He has bilateral crackles and positive  JVD. -Continue  with IV ceftriaxone and azithromycin -Continue with scheduled nebulizer.  Continue with Breo and Incruse -Cardiology  consulted -ECHO ordered.  -Change bed to step down unit. Might need BIPAP.   2-Hyperlipidemia: Continue with atorvastatin  3-Hypertension;  Continue with losartan and Metoprolol.   S/P Aortic Valve replacement.   Anxiety/ Continue with Sertraline.   GERD; Continue with PPI.  Diabetes: SSI Opioid use disorder in remission: Continue with Suboxone History of Hodgkin lymphoma  Weight loss: Follow HIV, check TSH.  Will need further evaluation by his PCP  Estimated body mass index is 24.69 kg/m as calculated from the following:   Height as of this encounter: '5\' 11"'$  (1.803 m).   Weight as of this encounter: 80.3 kg.   DVT prophylaxis: Lovenox Code Status: Full code Family Communication: Care discussed with patient.  Disposition Plan:  Status is: Inpatient Remains inpatient appropriate because: admitted with respiratory failure    Consultants:  Cardiology Pulmonology   Procedures:  ECHO  Antimicrobials:    Subjective: He is SOB, tachypnea, not feeling well.   Objective: Vitals:   06/14/22 0545 06/14/22 0600 06/14/22 0615 06/14/22 0738  BP: (!) 144/82 139/75 (!) 142/71   Pulse: 84 72 72   Resp: (!) 33 (!) 33 (!) 29   Temp:    98.5 F (36.9 C)  TempSrc:    Oral  SpO2: 93% 92% 90%   Weight:      Height:        Intake/Output Summary (Last 24 hours) at 06/14/2022 0755 Last data filed at 06/12/2022 2209 Gross per 24 hour  Intake 350 ml  Output --  Net 350 ml   Filed Weights   06/05/2022 1939  Weight: 80.3  kg    Examination:  General exam: Appears calm and comfortable  Respiratory system: increase work of breathing, BL crackles.  Cardiovascular system: S1 & S2 heard, RRR. positive JVD, Gastrointestinal system: Abdomen is nondistended, soft and nontender. No organomegaly or masses felt. Normal bowel sounds heard. Central nervous system:  Alert and oriented. No focal neurological deficits. Extremities: Symmetric 5 x 5 power.    Data Reviewed: I have personally reviewed following labs and imaging studies  CBC: Recent Labs  Lab 06/05/2022 2040 06/14/22 0530  WBC 14.1* 15.9*  NEUTROABS 11.1*  --   HGB 14.1 14.0  HCT 44.3 44.0  MCV 90.6 90.9  PLT 221 144   Basic Metabolic Panel: Recent Labs  Lab 05/26/2022 0050 06/06/2022 2040 06/14/22 0530  NA  --  133* 138  K  --  3.5 3.8  CL  --  96* 101  CO2  --  28 29  GLUCOSE  --  135* 129*  BUN  --  9 10  CREATININE  --  0.58* 0.53*  CALCIUM  --  8.6* 9.2  MG 2.0  --   --    GFR: Estimated Creatinine Clearance: 111.1 mL/min (A) (by C-G formula based on SCr of 0.53 mg/dL (L)). Liver Function Tests: Recent Labs  Lab 06/14/22 0530  AST 26  ALT 13  ALKPHOS 107  BILITOT 0.9  PROT 7.5  ALBUMIN 3.4*   No results for input(s): "LIPASE", "AMYLASE" in the last 168 hours. No results for input(s): "AMMONIA" in the last 168 hours. Coagulation Profile: Recent Labs  Lab 06/12/2022 2040  INR 1.1   Cardiac Enzymes: No results for input(s): "CKTOTAL", "CKMB", "CKMBINDEX", "TROPONINI" in the last 168 hours. BNP (last 3 results) No results for input(s): "PROBNP" in the last 8760 hours. HbA1C: No results for input(s): "HGBA1C" in the last 72 hours. CBG: No results for input(s): "GLUCAP" in the last 168 hours. Lipid Profile: No results for input(s): "CHOL", "HDL", "LDLCALC", "TRIG", "CHOLHDL", "LDLDIRECT" in the last 72 hours. Thyroid Function Tests: No results for input(s): "TSH", "T4TOTAL", "FREET4", "T3FREE", "THYROIDAB" in the last 72 hours. Anemia Panel: No results for input(s): "VITAMINB12", "FOLATE", "FERRITIN", "TIBC", "IRON", "RETICCTPCT" in the last 72 hours. Sepsis Labs: Recent Labs  Lab 06/05/2022 2040  LATICACIDVEN 1.1    Recent Results (from the past 240 hour(s))  Culture, blood (routine x 2)     Status: None (Preliminary result)   Collection Time:  06/15/2022  8:30 PM   Specimen: BLOOD  Result Value Ref Range Status   Specimen Description   Final    BLOOD RIGHT ANTECUBITAL Performed at [facility] [address], [place], [place] [address]    Special Requests   Final    BOTTLES DRAWN AEROBIC AND ANAEROBIC Blood Culture adequate volume Performed at [facility] [address], [place], [place] [address]    Culture   Final    NO GROWTH < 12 HOURS Performed at [facility] [address], [place], [place] [address]    Report Status PENDING  Incomplete  Culture, blood (routine x 2)     Status: None (Preliminary result)   Collection Time: 05/18/2022  8:40 PM   Specimen: BLOOD  Result Value Ref Range Status   Specimen Description   Final    BLOOD BLOOD RIGHT ARM Performed at [facility] [address] [place], [place] [address]    Special Requests   Final    BOTTLES DRAWN  AEROBIC AND ANAEROBIC Blood Culture adequate volume Performed at [facility] [address], [place], [place]  [address]    Culture   Final    NO GROWTH < 12 HOURS Performed at [facility] [address], [place], [place] [address]    Report Status PENDING  Incomplete  SARS Coronavirus 2 by RT PCR (hospital order, performed in [facility] hospital lab) *cepheid single result test* Anterior Nasal Swab     Status: None   Collection Time: 05/31/2022  8:40 PM   Specimen: Anterior Nasal Swab  Result Value Ref Range Status   SARS Coronavirus 2 by RT PCR NEGATIVE NEGATIVE Final    Comment: (NOTE) SARS-CoV-2 target nucleic acids are NOT DETECTED.  The SARS-CoV-2 RNA is generally detectable in upper and lower respiratory specimens during the acute phase of infection. The lowest concentration of SARS-CoV-2 viral copies this assay can detect is 250 copies / mL. A negative result does not preclude SARS-CoV-2 infection and should not be used as the sole basis for treatment or  other patient management decisions.  A negative result may occur with improper specimen collection / handling, submission of specimen other than nasopharyngeal swab, presence of viral mutation(s) within the areas targeted by this assay, and inadequate number of viral copies (<250 copies / mL). A negative result must be combined with clinical observations, patient history, and epidemiological information.  Fact Sheet for Patients:   https://www.patel.info/  Fact Sheet for Healthcare Providers: https://hall.com/  This test is not yet approved or  cleared by the [place] FDA and has been authorized for detection and/or diagnosis of SARS-CoV-2 by FDA under an Emergency Use Authorization (EUA).  This EUA will remain in effect (meaning this test can be used) for the duration of the COVID-19 declaration under Section 564(b)(1) of the Act, 21 U.S.C. section 360bbb-3(b)(1), unless the authorization is terminated or revoked sooner.  Performed at [facility], [place] [address], [place], [place] [address]          Radiology Studies: [facility] Chest Port 1 View  Result Date: 05/28/2022 CLINICAL DATA:  Short of breath EXAM: PORTABLE CHEST 1 VIEW COMPARISON:  08/27/2021, chest CT 02/19/2022 FINDINGS: Electronic device over left chest. Cardiomegaly with vascular congestion. Possible tiny pleural effusions. Patchy bilateral consolidations and ground-glass opacities. Aortic atherosclerosis. Surgical plate and fixating screws in the right clavicle IMPRESSION: 1. Cardiomegaly with vascular congestion and possible tiny pleural effusions. 2. Emphysema. Patchy bilateral ground-glass opacities and consolidations are suspect for bilateral pneumonia Electronically Signed   By: Donavan Foil M.D.   On: 05/23/2022 20:40        Scheduled Meds:  albuterol  2.5 mg Nebulization TID   aspirin EC  81 mg Oral Daily   atorvastatin  20 mg Oral Daily    buprenorphine-naloxone  1 tablet Sublingual Daily   enoxaparin (LOVENOX) injection  40 mg Subcutaneous Q24H   fluticasone furoate-vilanterol  1 puff Inhalation Daily   And   umeclidinium bromide  1 puff Inhalation Daily   insulin aspart  0-9 Units Subcutaneous TID WC   levothyroxine  175 mcg Oral Q0600   losartan  25 mg Oral Daily   metoprolol tartrate  50 mg Oral BID   pantoprazole  40 mg Oral BID   sertraline  200 mg Oral QHS   sodium chloride flush  3 mL Intravenous Q12H   torsemide  40 mg Oral Daily   Continuous Infusions:  azithromycin     cefTRIAXone (  ROCEPHIN)  IV       LOS: 1 day    Time spent: 35 minutes.     Elmarie Shiley, MD [facility]   If 7PM-7AM, please contact night-coverage [email or web address]  06/14/2022, 7:55 AM

## 2022-06-14 NOTE — Consult Note (Signed)
CARDIOLOGY CONSULT NOTE  Patient ID: Henry Sheppard MRN: [id number] DOB/AGE: 1966-05-09 56 y.o.  Admit date: 05/23/2022 Attending physician: Elmarie Shiley, MD Primary Physician:  Lilian Coma., MD Outpatient Cardiologist: Dr. Vernell Leep Inpatient Cardiologist: Rex Kras, DO, [facility]  Reason of consultation: CHF Referring physician: Elmarie Shiley, MD  Chief complaint: shortness of breath   HPI:  Henry Sheppard is a 56 y.o. 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  Over the last couple days he has been experiencing progressive dyspnea and has pulse ox at home has also noted hypoxia with saturation as low as 67% on 4L nasal cannula oxygen. He also has orthopnea and PND with no significant LE swelling. No sick contact but has been having chills. He stopped smoking ONE week ago. He endorses compliance w/ his home medications.   Due to hypoxia patient required BiPAP support cardiology consult requested for CHF evaluation.   At the time of evaluation patient is laying in bed at 45 degree, on [place] oxygen, mild distress w/ use of accessory muscles, and no chest pain. Since arrival his shortness of breath, orthopnea and PND have improved. He tested positive for adenovirus and is in isolation.   Has an extensive cardiac history but last office visit was 2 years ago. Patient states he has been doing well otherwise. No family at bedside.    ALLERGIES: No Known Allergies  PAST MEDICAL HISTORY: Past Medical History:  Diagnosis Date   Anxiety    Arthritis    COPD (chronic obstructive pulmonary disease) ([place])    Depression    H/O colonoscopy 2018   NORMAL   H/O echocardiogram 10/07/2018   MODERATE TO SEVERE  AORTIC VALVE STENOSIS..EF 40-50%   H/O endoscopy 2018   Heart murmur    Hodgkins lymphoma ([place])    in remission   Hyperlipidemia, group A    Hypertension, benign    Left bundle branch block    Narcotic dependence, in remission ([place])    Pneumonia     PAST SURGICAL HISTORY: Past Surgical History:  Procedure Laterality Date   AORTIC VALVE REPLACEMENT     21 mm pericardial tissue valve 2011 in [place]   ASCENDING AORTIC ROOT REPLACEMENT N/A 07/29/2019   Procedure: ASCENDING AORTIC PORCINE ROOT REPLACEMENT USING MEDTRONIC FREESTYLE AORTIC ROOT SIZE 88m and HEMOSHIELD STRAIGHT PLATINUM GRAFT SIZE 24;  Surgeon: BGaye Pollack MD;  [place]  Service: Open Heart Surgery;  Laterality: N/A;   CARDIAC CATHETERIZATION     12/22/18: 40% proximal RCA, normal LM, LAD, LCX   CARDIAC VALVE REPLACEMENT     AVR pericardial tissue valve 2011 ([place])   collarbone surgery Right    CORONARY ARTERY BYPASS GRAFT     lymph angiogram     when pt.  was 17 yrs. old   RIGHT/LEFT HEART CATH AND CORONARY ANGIOGRAPHY N/A 12/22/2018   Procedure: RIGHT/LEFT HEART CATH AND CORONARY ANGIOGRAPHY;  Surgeon: PNigel Mormon MD;  [facility];  Service: Cardiovascular;  Laterality: N/A;  SPLENECTOMY     STERNAL WIRES REMOVAL N/A 08/17/2021   Procedure: STERNAL WIRES REMOVAL;  Surgeon: [name], MD;  Location: [place];  Service: Thoracic;  Laterality: N/A;   TEE WITHOUT CARDIOVERSION N/A 12/22/2018   Procedure: TRANSESOPHAGEAL ECHOCARDIOGRAM (TEE);  Surgeon: Nigel Mormon, MD;  Location: [facility] ENDOSCOPY;  Service: Cardiovascular;  Laterality: N/A;   TEE WITHOUT CARDIOVERSION N/A 07/29/2019   Procedure:  TRANSESOPHAGEAL ECHOCARDIOGRAM (TEE);  Surgeon: [name], MD;  Location: [place];  Service: Open Heart Surgery;  Laterality: N/A;   TOTAL THYROIDECTOMY      FAMILY HISTORY: The patient's family history includes Asthma in his brother and brother; Cancer in his father and mother.   SOCIAL HISTORY:  The patient  reports that he has been smoking cigarettes. He has a 68.00 pack-year smoking history. He has never used smokeless tobacco. He reports that he does not currently use alcohol.  Drug: Marijuana.  MEDICATIONS: Current Outpatient Medications  Medication Instructions   albuterol (VENTOLIN HFA) 108 (90 Base) MCG/ACT inhaler 2 puffs, Inhalation, Every 6 hours PRN   aspirin EC 81 mg, Oral, Daily   atorvastatin (LIPITOR) 20 mg, Oral, Daily   Budeson-Glycopyrrol-Formoterol (BREZTRI AEROSPHERE) 160-9-4.8 MCG/ACT AERO 2 puffs, Inhalation, 2 times daily   butalbital-acetaminophen-caffeine (FIORICET) 50-325-40 MG tablet 1 tablet, Oral, Every 6 hours PRN   Cyanocobalamin (VITAMIN B12 PO) 1 tablet, Oral, Daily   folic acid (FOLVITE) 867 mcg, Oral, Daily   levothyroxine (SYNTHROID) 175 mcg, Oral, Daily before breakfast   losartan (COZAAR) 25 mg, Oral, Daily at bedtime   meloxicam (MOBIC) 15 mg, Oral, Daily PRN   metFORMIN (GLUCOPHAGE) 500 mg, Oral, 2 times daily   metoprolol tartrate (LOPRESSOR) 50 mg, Oral, 2 times daily   Movantik 25 mg, Oral, Daily   OXYGEN 3 L, Inhalation, Daily at bedtime   pantoprazole (PROTONIX) 40 mg, Oral, 2 times daily   potassium chloride (MICRO-K) 10 MEQ CR capsule 10 mEq, Oral, 2 times daily   predniSONE (DELTASONE) 60 mg, Daily   sertraline (ZOLOFT) 200 mg, Oral, Daily at bedtime   SUBOXONE 8-2 MG FILM 1 Film, Sublingual, 3 times daily   SUMAtriptan (IMITREX) 50 mg, Oral, Every 2 hours PRN   testosterone cypionate (DEPOTESTOSTERONE CYPIONATE) 200 MG/ML injection 0.5 mLs, Intramuscular, As directed, Every 2 weeks   torsemide (DEMADEX) 20 MG tablet TAKE 2  TABLETS BY MOUTH EVERY DAY   TRELEGY ELLIPTA 100-62.5-25 MCG/ACT AEPB 1 puff, Oral, Daily    REVIEW OF SYSTEMS: Review of Systems  Constitutional: Positive for diaphoresis and weight loss.  Cardiovascular:  Positive for dyspnea on exertion, orthopnea and paroxysmal nocturnal dyspnea. Negative for chest pain, claudication, irregular heartbeat, leg swelling, near-syncope, palpitations and syncope.  Respiratory:  Positive for cough, shortness of breath, sputum production and wheezing.   Hematologic/Lymphatic: Negative for bleeding problem.  Musculoskeletal:  Negative for muscle cramps and myalgias.  Neurological:  Negative for dizziness and light-headedness.  All other systems reviewed and are negative.   PHYSICAL EXAM:    06/14/2022    5:00 PM 06/14/2022    4:17 PM 06/14/2022    4:09 PM  Vitals with BMI  Systolic 619    Diastolic 78    Pulse 509 90 91     Intake/Output Summary (Last 24 hours) at 06/14/2022 1819 Last data filed at 06/14/2022 1612 Gross per 24 hour  Intake 350 ml  Output 2100 ml  Net -1750 ml    Net IO Since Admission: -1,750 mL [06/14/22 1819]  CONSTITUTIONAL: Appears older than stated age, hemodynamically stable, mild respiratory distress, use of accessory muscles, pursed breathing.  SKIN: Skin is warm and dry. No rash noted.  HEAD: Normocephalic and atraumatic.  EYES: No scleral icterus.  No xanthelasmas MOUTH/THROAT: Moist oral membranes.  NECK: JVD present. No thyromegaly noted. No carotid bruits  CHEST Normal respiratory effort. No intercostal retractions  LUNGS: Decreased breath sounds bilaterally, expiratory wheezing and rales noted bilaterally, use of accessory and abdominal muscles. CARDIOVASCULAR: Tachycardic, positive S1-S2, no murmurs rubs or gallops appreciated secondary to tachycardia, sternotomy site is well-healed.  ABDOMINAL: Soft, nontender, nondistended, positive bowel sounds in all 4 quadrants, no apparent ascites.  EXTREMITIES: No pitting  edema, warm to touch, 2+ bilateral posterior tibial pulses. HEMATOLOGIC: No significant bruising NEUROLOGIC: Oriented to person, place, and time. Nonfocal. Normal muscle tone.  PSYCHIATRIC: Normal mood and affect. Normal behavior. Cooperative  RADIOLOGY: ECHOCARDIOGRAM COMPLETE  Result Date: 06/14/2022    ECHOCARDIOGRAM REPORT   Patient Name:   Henry Sheppard Date of Exam: 06/14/2022 Medical Rec #:  [id number]         Height:       71.0 in Accession #:    [id number]        Weight:       172.0 lb Date of Birth:  02-05-1966         BSA:          1.977 m Patient Age:    43 years          BP:           125/73 mmHg Patient Gender: M                 HR:           73 bpm. Exam Location:  Inpatient Procedure: 2D Echo, Cardiac Doppler and Color Doppler Indications:     CHF-Acute Systolic W29.56  History:         Patient has prior history of Echocardiogram examinations, most                  recent 09/05/2019. COPD, Arrythmias:LBBB,                  Signs/Symptoms:Murmur; Risk Factors:Dyslipidemia and                  Hypertension.                  Aortic Valve: 21 mm bioprosthetic valve is present in the                  aortic position. Procedure Date: 07/29/2019.  Sonographer:     Darlina Sicilian RDCS Referring Phys:  [id number] [place] Diagnosing Phys: Vernell Leep MD  Sonographer Comments: Imaging limited due to BiPap. IMPRESSIONS  1. Left ventricular ejection fraction, by estimation, is 50 to 55%. The left ventricle has low normal function. The left ventricle demonstrates regional wall motion abnormalities (see scoring diagram/findings for description). Left ventricular diastolic  parameters are indeterminate.  2. Right ventricular systolic function is normal. The right ventricular size is normal.  3. The mitral valve is abnormal. Mild mitral valve regurgitation. Mild mitral stenosis. The mean mitral valve gradient is 5.0 mmHg. Moderate to severe mitral annular calcification.  4. The aortic valve  has been repaired/replaced. Aortic valve regurgitation is not visualized. There is a 21 mm bioprosthetic valve present in the aortic position. Procedure Date: 07/29/2019. Echo findings are consistent with normal  structure and function of the aortic valve prosthesis.  5. The inferior vena cava is dilated in size with <50% respiratory variability, suggesting right atrial pressure of 15 mmHg. Comparison(s): A prior study was performed on 09/03/2019. LVEF has improved from 40-45%. Tricuspid regurgitation not well appreciated on this study. FINDINGS  Left Ventricle: Left ventricular ejection fraction, by estimation, is 50 to 55%. The left ventricle has low normal function. The left ventricle demonstrates regional wall motion abnormalities. The left ventricular internal cavity size was normal in size. There is no left ventricular hypertrophy. Abnormal (paradoxical) septal motion consistent with post-operative status. Left ventricular diastolic parameters are indeterminate. Right Ventricle: The right ventricular size is normal. No increase in right ventricular wall thickness. Right ventricular systolic function is normal. Left Atrium: Left atrial size was normal in size. Right Atrium: Right atrial size was normal in size. Pericardium: There is no evidence of pericardial effusion. Mitral Valve: The mitral valve is abnormal. Moderate to severe mitral annular calcification. Mild mitral valve regurgitation. Mild mitral valve stenosis. MV peak gradient, 9.4 mmHg. The mean mitral valve gradient is 5.0 mmHg. Tricuspid Valve: The tricuspid valve is normal in structure. Tricuspid valve regurgitation is not demonstrated. No evidence of tricuspid stenosis. Aortic Valve: Mean PG 13-15 mmHg is acceptable. The aortic valve has been repaired/replaced. Aortic valve regurgitation is not visualized. Aortic valve mean gradient measures 12.3 mmHg. Aortic valve peak gradient measures 23.4 mmHg. Aortic valve area, by  VTI measures 1.82 cm.  There is a 21 mm bioprosthetic valve present in the aortic position. Procedure Date: 07/29/2019. Echo findings are consistent with normal structure and function of the aortic valve prosthesis. Pulmonic Valve: The pulmonic valve was grossly normal. Pulmonic valve regurgitation is trivial. Aorta: The aortic root is normal in size and structure. Venous: The inferior vena cava is dilated in size with less than 50% respiratory variability, suggesting right atrial pressure of 15 mmHg. IAS/Shunts: The interatrial septum was not assessed.  LEFT VENTRICLE PLAX 2D LVIDd:         5.60 cm LVIDs:         4.00 cm LV PW:         0.90 cm LV IVS:        0.90 cm LVOT diam:     1.90 cm LV SV:         74 LV SV Index:   38 LVOT Area:     2.84 cm  LV Volumes (MOD) LV vol d, MOD A2C: 96.4 ml LV vol d, MOD A4C: 128.0 ml LV vol s, MOD A2C: 58.2 ml LV vol s, MOD A4C: 54.4 ml LV SV MOD A2C:     38.2 ml LV SV MOD A4C:     128.0 ml LV SV MOD BP:      56.9 ml RIGHT VENTRICLE TAPSE (M-mode): 1.9 cm LEFT ATRIUM             Index LA diam:        4.40 cm 2.23 cm/m LA Vol (A2C):   53.2 ml 26.88 ml/m LA Vol (A4C):   46.9 ml 23.72 ml/m LA Biplane Vol: 44.8 ml 22.66 ml/m  AORTIC VALVE AV Area (Vmax):    1.67 cm AV Area (Vmean):   1.76 cm AV Area (VTI):     1.82 cm AV Vmax:           241.67 cm/s AV Vmean:          158.333 cm/s AV VTI:  0.409 m AV Peak Grad:      23.4 mmHg AV Mean Grad:      12.3 mmHg LVOT Vmax:         142.00 cm/s LVOT Vmean:        98.300 cm/s LVOT VTI:          0.262 m LVOT/AV VTI ratio: 0.64  AORTA Ao Asc diam: 3.00 cm MITRAL VALVE MV Area (PHT): 2.34 cm     SHUNTS MV Area VTI:   1.71 cm     Systemic VTI:  0.26 m MV Peak grad:  9.4 mmHg     Systemic Diam: 1.90 cm MV Mean grad:  5.0 mmHg MV Vmax:       1.54 m/s MV Vmean:      103.2 cm/s MV Decel Time: 324 msec MV E velocity: 144.00 cm/s MV A velocity: 110.00 cm/s MV E/A ratio:  1.31 Manish Patwardhan MD Electronically signed by Vernell Leep MD Signature  Date/Time: 06/14/2022/2:00:25 PM    Final    DG Chest Port 1 View  Result Date: 06/06/2022 CLINICAL DATA:  Short of breath EXAM: PORTABLE CHEST 1 VIEW COMPARISON:  08/27/2021, chest CT 02/19/2022 FINDINGS: Electronic device over left chest. Cardiomegaly with vascular congestion. Possible tiny pleural effusions. Patchy bilateral consolidations and ground-glass opacities. Aortic atherosclerosis. Surgical plate and fixating screws in the right clavicle IMPRESSION: 1. Cardiomegaly with vascular congestion and possible tiny pleural effusions. 2. Emphysema. Patchy bilateral ground-glass opacities and consolidations are suspect for bilateral pneumonia Electronically Signed   By: Donavan Foil M.D.   On: 05/25/2022 20:40    LABORATORY DATA: Lab Results  Component Value Date   WBC 15.9 (H) 06/14/2022   HGB 14.0 06/14/2022   HCT 44.0 06/14/2022   MCV 90.9 06/14/2022   PLT 218 06/14/2022    Recent Labs  Lab 06/14/22 0530  NA 138  K 3.8  CL 101  CO2 29  BUN 10  CREATININE 0.53*  CALCIUM 9.2  PROT 7.5  BILITOT 0.9  ALKPHOS 107  ALT 13  AST 26  GLUCOSE 129*    Lipid Panel  No results found for: "CHOL", "HDL", "LDLCALC", "LDLDIRECT", "TRIG", "CHOLHDL"  BNP (last 3 results) Recent Labs    06/09/2022 2040  BNP 436.7*    HEMOGLOBIN A1C Lab Results  Component Value Date   HGBA1C 6.3 (H) 07/27/2019   MPG 134 07/27/2019    Cardiac Panel (last 3 results) Recent Labs    06/09/2022 0050 05/23/2022 2040  TROPONINIHS 6 6     TSH Recent Labs    06/14/22 0830  TSH 3.674     CARDIAC DATABASE: EKG: 06/05/2022: Sinus rhythm, 87 bpm, left bundle branch block, PACs, ST-T changes likely secondary to LBBB but ischemia cannot be entirely ruled out.  Echocardiogram: 06/14/2022  1. Left ventricular ejection fraction, by estimation, is 50 to 55%. The  left ventricle has low normal function. The left ventricle demonstrates  regional wall motion abnormalities (see scoring diagram/findings  for  description). Left ventricular diastolic   parameters are indeterminate.   2. Right ventricular systolic function is normal. The right ventricular  size is normal.   3. The mitral valve is abnormal. Mild mitral valve regurgitation. Mild  mitral stenosis. The mean mitral valve gradient is 5.0 mmHg. Moderate to  severe mitral annular calcification.   4. The aortic valve has been repaired/replaced. Aortic valve  regurgitation is not visualized. There is a 21 mm bioprosthetic valve  present in the aortic position. Procedure  Date: 07/29/2019. Echo findings  are consistent with normal structure and  function of the aortic valve prosthesis.   5. The inferior vena cava is dilated in size with <50% respiratory  variability, suggesting right atrial pressure of 15 mmHg.  Comparison(s): A prior study was performed on 09/03/2019. LVEF has  improved from 40-45%. Tricuspid regurgitation not well appreciated on this  study.   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  IMPRESSION & RECOMMENDATIONS: Henry Sheppard is a 56 y.o. 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  Impression:  Acute exacerbation of heart failure with systolic and diastolic EF.  Acute on chronic hypoxemic respiratory failure due to CHF exacerbation/adenovirus/underlying COPD with chronic oxygen dependence  Status post redo sternotomy and aortic root replacement with 21 mm Medtronic freestyle aortic prosthesis  Left bundle branch block  COPD/nicotine dependence (quit smoking 1 week ago)/nocturnal oxygen dependence  History of opioid use  Hypothyroidism  Hypertension.  Hyperlipidemia  Plan:  His current acute exacerbation of congestive heart failure is likely secondary to recent adenovirus in a patient who has predisposing pulmonary comorbidities such as COPD, nocturnal oxygen dependence, active smoking until a week ago.  Denies anginal discomfort and high sensitive troponins negative x2.  EKG illustrates sinus rhythm with left bundle branch block.  Patient states that he has been compliant with his cardiac medications at home.  He takes Lopressor 50 twice daily, torsemide 40 mg every other day, Cozaar, aspirin, statin therapy.  Continue home dose of beta-blocker for now to help control the ventricular rate and avoid the possibility of cardiac arrhythmia.  Continue telemetry.  We will avoid up titration of AV nodal blocking agents given the Acute HFpEF exacerbation.  Echocardiogram notes low normal LVEF, aortic valve prosthesis functioning within acceptable limits, and mild degree of mitral stenosis (may be heart rate related).  Continue Lasix IV push twice daily.  We will start Farxiga 10 mg p.o. daily.  Strict I's and O's, daily weights.  Continue CPAP/BiPAP as tolerated given the degree of CHF/COPD exacerbation/adenovirus.  Given his productive cough and baseline COPD currently being treated for community-acquired pneumonia with parenteral antibiotics.  We will uptitrate his GDMT prior to  discharge as hemodynamics and renal function allows.  Plan of care discussed with attending physician.   Total encounter time 88 minutes. *Total Encounter Time as defined by the Centers for Medicare and Medicaid Services includes, in addition to the face-to-face time of a patient visit (documented in the note above) non-face-to-face time: obtaining and reviewing outside history, ordering and reviewing medications, tests or procedures, care coordination (communications with other health care professionals or caregivers) and documentation in the medical record.  Patient's questions and concerns were addressed to his satisfaction. He voices understanding of the  instructions provided during this encounter.   This note was created using a voice recognition software as a result there may be grammatical errors inadvertently enclosed that do not reflect the nature of this encounter. Every attempt is made to correct such errors.  [facility]  Pager: [phone] Office: [phone] 06/14/2022, 6:19 PM\

## 2022-06-14 NOTE — Progress Notes (Signed)
RT NOTE:  Pt placed on BiPAP per MD order for increased WOB. Pt states he feels good on the machine at this time. Pt instructed to take the mask off if he feels like he needs to throw up. Vitals are stable at this time, RT will continue to monitor.

## 2022-06-14 NOTE — ED Notes (Signed)
Patient reports SHOB.  Medicated with PRN breathing treatment

## 2022-06-14 NOTE — Progress Notes (Signed)
Patient called out complaining of shortness of breath stating he, "can't catch his breath." Respiratory called to bedsides. Patient's scheduled breathing treatments given, HFNC increased to 15L and left on with treatment. Patient's O2 saturation improved to 93-94%

## 2022-06-14 NOTE — Consult Note (Signed)
NAME:  Henry Sheppard, MRN:  [id number], DOB:  1966-05-31, LOS: 1 ADMISSION DATE:  06/12/2022, CONSULTATION DATE:  7/28 REFERRING MD:  Dr. Tyrell Antonio, CHIEF COMPLAINT:  SOB   History of Present Illness:  56 y/o M who presented to [facility] ER on 7/27 with reports of 2 days of progressive shortness of breath.    Initially there was some question of patient compliance with medications due to insurance issues but he reports that EMS misunderstood and that he has been taking his medications appropriately.  He reports he is not on chronic narcotics at this point.  States that he wears 3 L of oxygen at night for sleep.  He reports he has had at least 3 days of increasing shortness of breath, cough with yellow sputum production and 2 weeks of decreased activity tolerance.  He reports he continues to smoke 1 pack/day.  At home he noted his room air saturations dropped into the 60s with exertion.  He reports chills but no documented fever.  Initial ER evaluation notable for negative COVID screening.  RVP pending.  Initial labs notable for NA 133, K3.5, BUN 9, creatinine 0.58, BNP 436, troponin 6, WBC 14.1, hemoglobin 14.1, and platelets 221.  EKG notable for sinus rhythm with PAC and left bundle branch block.  Chest x-ray consistent with baseline emphysema, cardiomegaly with vascular congestion and patchy groundglass opacities.  Patient required BiPAP support on the a.m. of 7/28 for increased work of breathing.  He was given Lasix with at least 1.6 L urinary output in the emergency room.  PCCM consulted for pulmonary evaluation.  Pertinent  Medical History  COPD  Chronic Hypoxic Respiratory Failure - 2L QHS O2  Anxiety / Depression  Chronic Pain, Narcotic Dependence  HTN  HLD LBBB Murmur  Moderate to Severe Aortic Stenosis s/p AVR Arthritis   Significant Hospital Events: Including procedures, antibiotic start and stop dates in addition to other pertinent events   7/27 Admit per [facility] for SOB 7/28 PCCM  consulted for pulmonary evaluation   Interim History / Subjective:  Pt reports improved SOB on BiPAP.  Now having some nausea but no vomiting.  States he feels "dry" Afebrile   Objective   Blood pressure 140/62, pulse 79, temperature 98.3 F (36.8 C), temperature source Oral, resp. rate (!) 29, height '5\' 11"'$  (1.803 m), weight 80.3 kg, SpO2 93 %.        Intake/Output Summary (Last 24 hours) at 06/14/2022 0849 Last data filed at 06/07/2022 2209 Gross per 24 hour  Intake 350 ml  Output --  Net 350 ml   Filed Weights   06/06/2022 1939  Weight: 80.3 kg    Examination: General: chronically ill appearing adult male lying in bed in NAD on BiPAP  HENT: MM pink/dry, BiPAP mask in place, anicteric  Lungs: non-labored at rest on bipap, good air entry but diminished breath sounds  Cardiovascular: s1s2 RRR, no m/r/g, JVD+ Abdomen: non-tender, non-distended, BSx4 active  Extremities: warm/dry, BLE changes consistent with chronic venous stasis  Neuro: AAOx4, speech clear, MAE  Resolved Hospital Problem list     Assessment & Plan:   Acute on Chronic Hypoxic Respiratory Failure  COPD with Acute Exacerbation Rule Out PNA  Emphysema   Baseline 3L QHS O2 dependent.  -rocephin + azithromycin for COPD exacerbation  -lasix for volume removal  -PRN bipap for increased work of breathing  -follow intermittent CXR  -pulmonary hygiene -IS, mobilize  -assess PCT  -assess RVP  -hold home inhalers, add  brovana, pulmicort & yupelri while acutely ill -will need ambulatory O2 needs assessment prior to discharge  Acute Systolic HF Exacerbation  HTN, HLD, LBBB Hx AS s/p Tissue AVR Troponin flat, EKG without ST changes.  -tele monitoring  -assess ECHO -per [facility] / Cardiology   Anxiety  -per [facility]   Hx Opioid Use Disorder  -suboxone per [facility]   Weight Loss  ? Pulmonary cachexia  -HIV, TSH per primary   Hypothyroidism  -per [facility]   DM II  -per [facility]  Best Practice (right click and "Reselect  all SmartList Selections" daily)  Per [facility] / Primary SVC   Labs   CBC: Recent Labs  Lab 05/18/2022 2040 06/14/22 0530  WBC 14.1* 15.9*  NEUTROABS 11.1*  --   HGB 14.1 14.0  HCT 44.3 44.0  MCV 90.6 90.9  PLT 221 102    Basic Metabolic Panel: Recent Labs  Lab 05/19/2022 0050 06/04/2022 2040 06/14/22 0530  NA  --  133* 138  K  --  3.5 3.8  CL  --  96* 101  CO2  --  28 29  GLUCOSE  --  135* 129*  BUN  --  9 10  CREATININE  --  0.58* 0.53*  CALCIUM  --  8.6* 9.2  MG 2.0  --   --    GFR: Estimated Creatinine Clearance: 111.1 mL/min (A) (by C-G formula based on SCr of 0.53 mg/dL (L)). Recent Labs  Lab 06/12/2022 2040 06/14/22 0530  WBC 14.1* 15.9*  LATICACIDVEN 1.1  --     Liver Function Tests: Recent Labs  Lab 06/14/22 0530  AST 26  ALT 13  ALKPHOS 107  BILITOT 0.9  PROT 7.5  ALBUMIN 3.4*   No results for input(s): "LIPASE", "AMYLASE" in the last 168 hours. No results for input(s): "AMMONIA" in the last 168 hours.  ABG    Component Value Date/Time   PHART 7.365 07/29/2019 2219   PCO2ART 44.3 07/29/2019 2219   PO2ART 76.0 (L) 07/29/2019 2219   HCO3 25.1 07/29/2019 2219   TCO2 26 07/29/2019 2219   O2SAT 94.0 07/29/2019 2219     Coagulation Profile: Recent Labs  Lab 06/16/2022 2040  INR 1.1    Cardiac Enzymes: No results for input(s): "CKTOTAL", "CKMB", "CKMBINDEX", "TROPONINI" in the last 168 hours.  HbA1C: Hgb A1c MFr Bld  Date/Time Value Ref Range Status  07/27/2019 12:20 PM 6.3 (H) 4.8 - 5.6 % Final    Comment:    (NOTE)         Prediabetes: 5.7 - 6.4         Diabetes: >6.4         Glycemic control for adults with diabetes: <7.0     CBG: Recent Labs  Lab 06/14/22 0832  GLUCAP 133*    Review of Systems: Positives in [place]   Gen: Denies fever, chills, weight change, fatigue, night sweats HEENT: Denies blurred vision, double vision, hearing loss, tinnitus, sinus congestion, rhinorrhea, sore throat, neck stiffness, dysphagia PULM: Denies  shortness of breath, cough, sputum production, hemoptysis, wheezing CV: Denies chest pain, edema, orthopnea, paroxysmal nocturnal dyspnea, palpitations GI: Denies abdominal pain, nausea, vomiting, diarrhea, hematochezia, melena, constipation, change in bowel habits GU: Denies dysuria, hematuria, polyuria, oliguria, urethral discharge Endocrine: Denies hot or cold intolerance, polyuria, polyphagia or appetite change Derm: Denies rash, dry skin, scaling or peeling skin change Heme: Denies easy bruising, bleeding, bleeding gums Neuro: Denies headache, numbness, weakness, slurred speech, loss of memory or consciousness  Past Medical  History:  He,  has a past medical history of Anxiety, Arthritis, COPD (chronic obstructive pulmonary disease) ([place]), Depression, Dyspnea, H/O colonoscopy (2018), H/O echocardiogram (10/07/2018), H/O endoscopy (2018), Heart murmur, Hodgkins lymphoma ([place]), Hyperlipidemia, group A, Hypertension, benign, Left bundle branch block, Narcotic dependence, in remission ([place]), and Pneumonia.   Surgical History:   Past Surgical History:  Procedure Laterality Date   AORTIC VALVE REPLACEMENT     21 mm pericardial tissue valve 2011 in [place]   ASCENDING AORTIC ROOT REPLACEMENT N/A 07/29/2019   Procedure: ASCENDING AORTIC PORCINE ROOT REPLACEMENT USING MEDTRONIC FREESTYLE AORTIC ROOT SIZE 54m and HEMOSHIELD STRAIGHT PLATINUM GRAFT SIZE 24;  Surgeon: BGaye Pollack MD;  [place] [facility]  Service: Open Heart Surgery;  Laterality: N/A;   CARDIAC CATHETERIZATION     12/22/18: 40% proximal RCA, normal LM, LAD, LCX   CARDIAC VALVE REPLACEMENT     AVR pericardial tissue valve 2011 ([place])   collarbone surgery Right    CORONARY ARTERY BYPASS GRAFT     lymph angiogram     when pt.  was 17 yrs. old   RIGHT/LEFT HEART CATH AND CORONARY ANGIOGRAPHY N/A 12/22/2018   Procedure: RIGHT/LEFT HEART CATH AND CORONARY ANGIOGRAPHY;  Surgeon: PNigel Mormon MD;  [facility];   Service: Cardiovascular;  Laterality: N/A;   SPLENECTOMY     STERNAL WIRES REMOVAL N/A 08/17/2021   Procedure: STERNAL WIRES REMOVAL;  Surgeon: BGaye Pollack MD;  [place] [facility]  Service: Thoracic;  Laterality: N/A;   TEE WITHOUT CARDIOVERSION N/A 12/22/2018   Procedure: TRANSESOPHAGEAL ECHOCARDIOGRAM (TEE);  Surgeon: PNigel Mormon MD;  [facility];  Service: Cardiovascular;  Laterality: N/A;   TEE WITHOUT CARDIOVERSION N/A 07/29/2019   Procedure: TRANSESOPHAGEAL ECHOCARDIOGRAM (TEE);  Surgeon: BGaye Pollack MD;  [place] [facility]  Service: Open Heart Surgery;  Laterality: N/A;   TOTAL THYROIDECTOMY       Social History:   reports that he has been smoking cigarettes. He has a 68.00 pack-year smoking history. He has never used smokeless tobacco. He reports that he does not currently use alcohol.  Drug: Marijuana.   Family History:  His family history includes Asthma in his brother and brother; Cancer in his father and mother. There is no history of Lung disease.   Allergies No Known Allergies   Home Medications  Prior to Admission medications   Medication Sig Start Date End Date Taking? Authorizing Provider  albuterol (VENTOLIN HFA) 108 (90 Base) MCG/ACT inhaler Inhale 2 puffs into the lungs every 6 (six) hours as needed (wheezing/shortness of breath). 03/08/20  Yes Icard, BOctavio Graves DO  aspirin EC 81 MG tablet Take 1 tablet (81 mg total) by mouth daily. 10/22/19  Yes Patwardhan, Manish J, MD  atorvastatin (LIPITOR) 40 MG tablet Take 20 mg by mouth daily.   Yes [provider]  butalbital-acetaminophen-caffeine (FIORICET) 50-325-40 MG tablet Take 1 tablet by mouth every 6 (six) hours as needed. 05/29/22  Yes [provider]  Cyanocobalamin (VITAMIN B12 PO) Take 1 tablet by mouth daily.   Yes [provider]  folic acid (FOLVITE) 4063MCG tablet Take 400 mcg by mouth daily. 08/28/20  Yes [provider]  levothyroxine (SYNTHROID) 175  MCG tablet Take 175 mcg by mouth daily before breakfast. 02/12/21  Yes [provider]  losartan (COZAAR) 25 MG tablet Take 25 mg by mouth at bedtime.  05/27/18  Yes [provider]  meloxicam (MOBIC) 15 MG tablet Take 15  mg by mouth daily as needed for pain. 02/20/21  Yes [provider]  metFORMIN (GLUCOPHAGE) 500 MG tablet Take 500 mg by mouth 2 (two) times daily. 05/01/22  Yes [provider]  metoprolol tartrate (LOPRESSOR) 50 MG tablet Take 50 mg by mouth 2 (two) times daily. 02/07/21  Yes [provider]  MOVANTIK 25 MG TABS tablet Take 25 mg by mouth daily. 02/11/21  Yes [provider]  pantoprazole (PROTONIX) 40 MG tablet Take 40 mg by mouth 2 (two) times daily. 01/24/21  Yes [provider]  potassium chloride (MICRO-K) 10 MEQ CR capsule Take 10 mEq by mouth 2 (two) times daily. 06/28/19  Yes [provider]  sertraline (ZOLOFT) 100 MG tablet Take 200 mg by mouth at bedtime. 05/05/18 05/31/2022 Yes [provider]  SUBOXONE 8-2 MG FILM Place 1 Film under the tongue in the morning, at noon, and at bedtime. 07/21/18  Yes [provider]  SUMAtriptan (IMITREX) 50 MG tablet Take 50 mg by mouth every 2 (two) hours as needed for migraine. 05/29/22  Yes [provider]  testosterone cypionate (DEPOTESTOSTERONE CYPIONATE) 200 MG/ML injection Inject 0.5 mLs into the muscle as directed. Every 2 weeks 04/27/22  Yes [provider]  torsemide (DEMADEX) 20 MG tablet TAKE 2 TABLETS BY MOUTH EVERY DAY Patient taking differently: Take 40 mg by mouth daily. 03/11/22  Yes Adrian Prows, MD  TRELEGY ELLIPTA 100-62.5-25 MCG/ACT AEPB Take 1 puff by mouth daily. 05/17/22  Yes [provider]  Budeson-Glycopyrrol-Formoterol (BREZTRI AEROSPHERE) 160-9-4.8 MCG/ACT AERO Inhale 2 puffs into the lungs in the morning and at bedtime. Patient not taking: Reported on 05/24/2022 03/08/20   June Leap L, DO  OXYGEN Inhale 3 L  into the lungs at bedtime.    [provider]  predniSONE (DELTASONE) 20 MG tablet Take 60 mg by mouth daily. Patient not taking: Reported on 12/12/2021 08/09/21   [provider]     Critical care time:     Noe Gens, MSN, APRN, NP-C, AGACNP-BC [facility] 06/14/2022, 8:49 AM   Please see [email or web address] for pager details.   From 7A-7P if no response, please call [phone] After hours, please call ELink [phone]

## 2022-06-15 ENCOUNTER — Inpatient Hospital Stay (HOSPITAL_COMMUNITY): Payer: Medicare Other

## 2022-06-15 DIAGNOSIS — I5033 Acute on chronic diastolic (congestive) heart failure: Secondary | ICD-10-CM

## 2022-06-15 DIAGNOSIS — J9621 Acute and chronic respiratory failure with hypoxia: Secondary | ICD-10-CM | POA: Diagnosis not present

## 2022-06-15 LAB — CBC WITH DIFFERENTIAL/PLATELET
Abs Immature Granulocytes: 0.07 10*3/uL (ref 0.00–0.07)
Basophils Absolute: 0.1 10*3/uL (ref 0.0–0.1)
Basophils Relative: 0 %
Eosinophils Absolute: 0 10*3/uL (ref 0.0–0.5)
Eosinophils Relative: 0 %
HCT: 47.2 % (ref 39.0–52.0)
Hemoglobin: 15 g/dL (ref 13.0–17.0)
Immature Granulocytes: 0 %
Lymphocytes Relative: 6 %
Lymphs Abs: 1 10*3/uL (ref 0.7–4.0)
MCH: 28.8 pg (ref 26.0–34.0)
MCHC: 31.8 g/dL (ref 30.0–36.0)
MCV: 90.8 fL (ref 80.0–100.0)
Monocytes Absolute: 2.3 10*3/uL — ABNORMAL HIGH (ref 0.1–1.0)
Monocytes Relative: 14 %
Neutro Abs: 13.5 10*3/uL — ABNORMAL HIGH (ref 1.7–7.7)
Neutrophils Relative %: 80 %
Platelets: 223 10*3/uL (ref 150–400)
RBC: 5.2 MIL/uL (ref 4.22–5.81)
RDW: 18.7 % — ABNORMAL HIGH (ref 11.5–15.5)
WBC: 17 10*3/uL — ABNORMAL HIGH (ref 4.0–10.5)
nRBC: 0 % (ref 0.0–0.2)

## 2022-06-15 LAB — ECHOCARDIOGRAM COMPLETE
AR max vel: 1.67 cm2
AV Area VTI: 1.82 cm2
AV Area mean vel: 1.76 cm2
AV Mean grad: 12.3 mmHg
AV Peak grad: 23.4 mmHg
Ao pk vel: 2.42 m/s
Area-P 1/2: 2.34 cm2
Calc EF: 50.2 %
Height: 71 in
MV VTI: 1.71 cm2
S' Lateral: 4 cm
Single Plane A2C EF: 39.6 %
Single Plane A4C EF: 57.5 %
Weight: 2832 oz

## 2022-06-15 LAB — BLOOD GAS, ARTERIAL
Acid-Base Excess: 14 mmol/L — ABNORMAL HIGH (ref 0.0–2.0)
Bicarbonate: 41.5 mmol/L — ABNORMAL HIGH (ref 20.0–28.0)
FIO2: 0.7 %
O2 Saturation: 97.8 %
Patient temperature: 37
pCO2 arterial: 64 mmHg — ABNORMAL HIGH (ref 32–48)
pH, Arterial: 7.42 (ref 7.35–7.45)
pO2, Arterial: 83 mmHg (ref 83–108)

## 2022-06-15 LAB — COMPREHENSIVE METABOLIC PANEL
ALT: 13 U/L (ref 0–44)
AST: 29 U/L (ref 15–41)
Albumin: 3.2 g/dL — ABNORMAL LOW (ref 3.5–5.0)
Alkaline Phosphatase: 98 U/L (ref 38–126)
Anion gap: 12 (ref 5–15)
BUN: 14 mg/dL (ref 6–20)
CO2: 30 mmol/L (ref 22–32)
Calcium: 8.8 mg/dL — ABNORMAL LOW (ref 8.9–10.3)
Chloride: 94 mmol/L — ABNORMAL LOW (ref 98–111)
Creatinine, Ser: 0.57 mg/dL — ABNORMAL LOW (ref 0.61–1.24)
GFR, Estimated: >60 mL/min (ref >60)
Glucose, Bld: 126 mg/dL — ABNORMAL HIGH (ref 70–99)
Potassium: 4 mmol/L (ref 3.5–5.1)
Sodium: 136 mmol/L (ref 135–145)
Total Bilirubin: 1.1 mg/dL (ref 0.3–1.2)
Total Protein: 7.5 g/dL (ref 6.5–8.1)

## 2022-06-15 LAB — GLUCOSE, CAPILLARY
Glucose-Capillary: 133 mg/dL — ABNORMAL HIGH (ref 70–99)
Glucose-Capillary: 140 mg/dL — ABNORMAL HIGH (ref 70–99)
Glucose-Capillary: 200 mg/dL — ABNORMAL HIGH (ref 70–99)
Glucose-Capillary: 120 mg/dL — ABNORMAL HIGH (ref 70–99)

## 2022-06-15 LAB — PROCALCITONIN: Procalcitonin: 0.15 ng/mL

## 2022-06-15 LAB — MAGNESIUM: Magnesium: 1.9 mg/dL (ref 1.7–2.4)

## 2022-06-15 LAB — PHOSPHORUS: Phosphorus: 3.3 mg/dL (ref 2.5–4.6)

## 2022-06-15 MED ORDER — FUROSEMIDE 10 MG/ML IJ SOLN
40.0000 mg | Freq: Three times a day (TID) | INTRAMUSCULAR | Status: DC
  Administered 2022-06-15 – 2022-06-16 (×3): 40 mg via INTRAVENOUS
  Filled 2022-06-15 (×3): qty 4

## 2022-06-15 MED ORDER — ACETAMINOPHEN 500 MG PO TABS
1000.0000 mg | ORAL_TABLET | Freq: Once | ORAL | Status: AC
Start: 2022-06-15 — End: 2022-06-15
  Administered 2022-06-15: 1000 mg via ORAL
  Filled 2022-06-15: qty 2

## 2022-06-15 MED ORDER — MIDAZOLAM HCL 2 MG/2ML IJ SOLN
2.0000 mg | INTRAMUSCULAR | Status: DC | PRN
  Administered 2022-06-16: 2 mg via INTRAVENOUS
  Filled 2022-06-15: qty 2

## 2022-06-15 MED ORDER — METOPROLOL TARTRATE 25 MG PO TABS
50.0000 mg | ORAL_TABLET | Freq: Three times a day (TID) | ORAL | Status: DC
  Administered 2022-06-15 – 2022-06-16 (×3): 50 mg via ORAL
  Filled 2022-06-15 (×4): qty 2

## 2022-06-15 MED ORDER — METHYLPREDNISOLONE SODIUM SUCC 40 MG IJ SOLR
40.0000 mg | Freq: Two times a day (BID) | INTRAMUSCULAR | Status: DC
  Administered 2022-06-15 – 2022-06-16 (×4): 40 mg via INTRAVENOUS
  Filled 2022-06-15 (×4): qty 1

## 2022-06-15 MED ORDER — FUROSEMIDE 10 MG/ML IJ SOLN
40.0000 mg | Freq: Once | INTRAMUSCULAR | Status: AC
  Administered 2022-06-15: 40 mg via INTRAVENOUS
  Filled 2022-06-15: qty 4

## 2022-06-15 MED ORDER — MAGNESIUM SULFATE 2 GM/50ML IV SOLN
2.0000 g | Freq: Once | INTRAVENOUS | Status: AC
  Administered 2022-06-15: 2 g via INTRAVENOUS
  Filled 2022-06-15: qty 50

## 2022-06-15 NOTE — Progress Notes (Signed)
PROGRESS NOTE    Henry Sheppard  ZOX:[id number] DOB: Aug 02, 1966 DOA: 06/17/2022 PCP: Lilian Coma., MD   Brief Narrative: 56 year old with past medical history significant for anemia, pulmonary hypertension, CHF per lipidemia, status post aortic valve replacement x2, hypertension, CAD, Hodgkin lymphoma, COPD, chronic nightly oxygen who presents complaining of worsening shortness of breath that is started 2 days prior to admission.  He was found to have oxygen saturation 67 on room air and improved to 89 on 5 L.  He reports productive cough and headaches.  Patient admitted with acute on chronic hypoxic respiratory failure, multifactorial secondary to pneumonia, COPD exacerbation and or heart failure exacerbation.   Assessment & Plan:   Principal Problem:   Acute on chronic respiratory failure with hypoxia (HCC) Active Problems:   Other secondary pulmonary hypertension (HCC)   HFrEF (heart failure with reduced ejection fraction) (HCC)   S/P AVR   Essential hypertension   Prosthetic aortic valve stenosis   Narcotic dependence, in remission ([place])   Hyperlipidemia, group A   Hodgkins lymphoma ([place])   Coronary artery disease involving native coronary artery of native heart without angina pectoris   Gastroesophageal reflux disease without esophagitis   Personal history of Hodgkin lymphoma   On home oxygen therapy   Type 2 diabetes mellitus without complication, without long-term current use of insulin (HCC)   Hypothyroidism   COPD with acute exacerbation (HCC)   Respiratory failure (HCC)   1-Acute on Chronic Hypoxic Respiratory Failure:  Acute systolic and Diastolic  heart failure exacerbation Pneumonia COPD  -Patient presented with worsening shortness of breath, cough, leukocytosis, elevated BNP, chest x-ray patchy bilateral groundglass opacity consistent with pneumonia also degree of vascular congestion. He has bilateral crackles and positive  JVD. -Continue with IV  ceftriaxone and azithromycin -Continue with scheduled nebulizer.  Continue with Candiss Norse -Cardiology  consulted -ECHO ordered.  -Change bed to step down unit. Might need BIPAP.  Lasix change to TID IV.  Started on IV steroids.  Chest x ray: Persistent BL airspace opacities, slightly improve RU lobe.  He develops nausea, remove BIPAP. He was place on HFO.   2-Hyperlipidemia: Continue with atorvastatin  3-Hypertension;  Continue with losartan and Metoprolol.   S/P Aortic Valve replacement.   Anxiety/ Continue with Sertraline.   GERD; Continue with PPI.  Diabetes: SSI Opioid use disorder in remission: Continue with Suboxone History of Hodgkin lymphoma  Weight loss: HIV non reactive.  ,  TSH: 3.6.  Will need further evaluation by his PCP ? Pulmonary cachexia.   Estimated body mass index is 23.98 kg/m as calculated from the following:   Height as of this encounter: '5\' 11"'$  (1.803 m).   Weight as of this encounter: 78 kg.   DVT prophylaxis: Lovenox Code Status: Full code Family Communication: Care discussed with patient.  Disposition Plan:  Status is: Inpatient Remains inpatient appropriate because: admitted with respiratory failure    Consultants:  Cardiology Pulmonology   Procedures:  ECHO  Antimicrobials:    Subjective: He develops nausea, he removed BIPAP. He was place on  at 15 L and  NB mask.   Objective: Vitals:   06/15/22 1242 06/15/22 1300 06/15/22 1400 06/15/22 1415  BP:  (!) 165/70 115/78 115/78  Pulse:  70 73   Resp:  (!) 25 (!) 24   Temp: 98.1 F (36.7 C)     TempSrc: Oral     SpO2:  95% 92% 93%  Weight:      Height:  Intake/Output Summary (Last 24 hours) at 06/15/2022 1429 Last data filed at 06/15/2022 1100 Gross per 24 hour  Intake 395.77 ml  Output 3325 ml  Net -2929.23 ml    Filed Weights   06/08/2022 1939 06/14/22 1045  Weight: 80.3 kg 78 kg    Examination:  General exam: NAD Respiratory system: Tachypnea. BL  wheezing.  Cardiovascular system: S 1, S 2 RRR Gastrointestinal system: BS present, soft, nt Central nervous system: alert Extremities: no edema   Data Reviewed: I have personally reviewed following labs and imaging studies  CBC: Recent Labs  Lab 06/14/2022 2040 06/14/22 0530 06/15/22 0303  WBC 14.1* 15.9* 17.0*  NEUTROABS 11.1*  --  13.5*  HGB 14.1 14.0 15.0  HCT 44.3 44.0 47.2  MCV 90.6 90.9 90.8  PLT 221 218 025    Basic Metabolic Panel: Recent Labs  Lab 05/31/2022 0050 05/30/2022 2040 06/14/22 0530 06/15/22 0303  NA  --  133* 138 136  K  --  3.5 3.8 4.0  CL  --  96* 101 94*  CO2  --  '28 29 30  '$ GLUCOSE  --  135* 129* 126*  BUN  --  '9 10 14  '$ CREATININE  --  0.58* 0.53* 0.57*  CALCIUM  --  8.6* 9.2 8.8*  MG 2.0  --   --  1.9  PHOS  --   --   --  3.3    GFR: Estimated Creatinine Clearance: 111.1 mL/min (A) (by C-G formula based on SCr of 0.57 mg/dL (L)). Liver Function Tests: Recent Labs  Lab 06/14/22 0530 06/15/22 0303  AST 26 29  ALT 13 13  ALKPHOS 107 98  BILITOT 0.9 1.1  PROT 7.5 7.5  ALBUMIN 3.4* 3.2*    No results for input(s): "LIPASE", "AMYLASE" in the last 168 hours. No results for input(s): "AMMONIA" in the last 168 hours. Coagulation Profile: Recent Labs  Lab 05/30/2022 2040  INR 1.1    Cardiac Enzymes: No results for input(s): "CKTOTAL", "CKMB", "CKMBINDEX", "TROPONINI" in the last 168 hours. BNP (last 3 results) No results for input(s): "PROBNP" in the last 8760 hours. HbA1C: No results for input(s): "HGBA1C" in the last 72 hours. CBG: Recent Labs  Lab 06/14/22 1612 06/14/22 1747 06/14/22 2201 06/15/22 0724 06/15/22 1142  GLUCAP 150* 113* 162* 133* 140*   Lipid Profile: No results for input(s): "CHOL", "HDL", "LDLCALC", "TRIG", "CHOLHDL", "LDLDIRECT" in the last 72 hours. Thyroid Function Tests: Recent Labs    06/14/22 0830  TSH 3.674   Anemia Panel: No results for input(s): "VITAMINB12", "FOLATE", "FERRITIN", "TIBC",  "IRON", "RETICCTPCT" in the last 72 hours. Sepsis Labs: Recent Labs  Lab 06/09/2022 2040 06/14/22 1236 06/15/22 0303  PROCALCITON  --  <0.10 0.15  LATICACIDVEN 1.1  --   --      Recent Results (from the past 240 hour(s))  Culture, blood (routine x 2)     Status: None (Preliminary result)   Collection Time: 05/25/2022  8:30 PM   Specimen: BLOOD  Result Value Ref Range Status   Specimen Description   Final    BLOOD RIGHT ANTECUBITAL Performed at [facility] [address], [place], [place] [address]    Special Requests   Final    BOTTLES DRAWN AEROBIC AND ANAEROBIC Blood Culture adequate volume Performed at [facility] [address], [place], [place] [address]    Culture   Final    NO GROWTH 2 DAYS Performed at [facility],  [address], [place], [place] [address]    Report Status PENDING  Incomplete  Culture, blood (routine x 2)     Status: None (Preliminary result)   Collection Time: 05/30/2022  8:40 PM   Specimen: BLOOD  Result Value Ref Range Status   Specimen Description   Final    BLOOD BLOOD RIGHT ARM Performed at [facility] [address], [place], [place] [address]    Special Requests   Final    BOTTLES DRAWN AEROBIC AND ANAEROBIC Blood Culture adequate volume Performed at [facility] [address], [place], [place] [address]    Culture   Final    NO GROWTH 2 DAYS Performed at [facility] [address], [place], [place] [address]    Report Status PENDING  Incomplete  SARS Coronavirus 2 by RT PCR (hospital order, performed in [facility] hospital lab) *cepheid single result test* Anterior Nasal Swab     Status: None   Collection Time: 05/18/2022  8:40 PM   Specimen: Anterior Nasal Swab  Result Value Ref Range Status   SARS Coronavirus 2 by RT PCR NEGATIVE NEGATIVE Final    Comment: (NOTE) SARS-CoV-2 target nucleic acids are NOT DETECTED.  The SARS-CoV-2 RNA is  generally detectable in upper and lower respiratory specimens during the acute phase of infection. The lowest concentration of SARS-CoV-2 viral copies this assay can detect is 250 copies / mL. A negative result does not preclude SARS-CoV-2 infection and should not be used as the sole basis for treatment or other patient management decisions.  A negative result may occur with improper specimen collection / handling, submission of specimen other than nasopharyngeal swab, presence of viral mutation(s) within the areas targeted by this assay, and inadequate number of viral copies (<250 copies / mL). A negative result must be combined with clinical observations, patient history, and epidemiological information.  Fact Sheet for Patients:   https://www.patel.info/  Fact Sheet for Healthcare Providers: https://hall.com/  This test is not yet approved or  cleared by the [place] FDA and has been authorized for detection and/or diagnosis of SARS-CoV-2 by FDA under an Emergency Use Authorization (EUA).  This EUA will remain in effect (meaning this test can be used) for the duration of the COVID-19 declaration under Section 564(b)(1) of the Act, 21 U.S.C. section 360bbb-3(b)(1), unless the authorization is terminated or revoked sooner.  Performed at [facility], [place] [address] [place], [place] [address]   Respiratory (~20 pathogens) panel by PCR     Status: Abnormal   Collection Time: 06/14/22  8:49 AM   Specimen: Nasopharyngeal Swab; Respiratory  Result Value Ref Range Status   Adenovirus DETECTED (A) NOT DETECTED Final   Coronavirus 229E NOT DETECTED NOT DETECTED Final    Comment: (NOTE) The Coronavirus on the Respiratory Panel, DOES NOT test for the novel  Coronavirus (2019 nCoV)    Coronavirus HKU1 NOT DETECTED NOT DETECTED Final   Coronavirus NL63 NOT DETECTED NOT DETECTED Final   Coronavirus OC43 NOT DETECTED NOT  DETECTED Final   Metapneumovirus NOT DETECTED NOT DETECTED Final   Rhinovirus / Enterovirus NOT DETECTED NOT DETECTED Final   Influenza A NOT DETECTED NOT DETECTED Final   Influenza B NOT DETECTED NOT DETECTED Final   Parainfluenza Virus 1 NOT DETECTED NOT DETECTED Final   Parainfluenza Virus 2 NOT DETECTED NOT DETECTED Final   Parainfluenza Virus 3 NOT DETECTED NOT DETECTED Final   Parainfluenza Virus 4 NOT  DETECTED NOT DETECTED Final   Respiratory Syncytial Virus NOT DETECTED NOT DETECTED Final   Bordetella pertussis NOT DETECTED NOT DETECTED Final   Bordetella Parapertussis NOT DETECTED NOT DETECTED Final   Chlamydophila pneumoniae NOT DETECTED NOT DETECTED Final   Mycoplasma pneumoniae NOT DETECTED NOT DETECTED Final    Comment: Performed at [facility], [place] [name] [place], [place] [address]  MRSA Next Gen by PCR, Nasal     Status: None   Collection Time: 06/14/22 10:44 AM   Specimen: Nasal Mucosa; Nasal Swab  Result Value Ref Range Status   MRSA by PCR Next Gen NOT DETECTED NOT DETECTED Final    Comment: (NOTE) The GeneXpert MRSA Assay (FDA approved for NASAL specimens only), is one component of a comprehensive MRSA colonization surveillance program. It is not intended to diagnose MRSA infection nor to guide or monitor treatment for MRSA infections. Test performance is not FDA approved in patients less than 104 years old. Performed at [facility], [place] [address] [place], [place] [address]          Radiology Studies: ECHOCARDIOGRAM COMPLETE  Result Date: 06/15/2022    ECHOCARDIOGRAM REPORT   Patient Name:   Henry Sheppard Date of Exam: 06/14/2022 Medical Rec #:  [id number]         Height:       71.0 in Accession #:    [id number]        Weight:       172.0 lb Date of Birth:  March 15, 1966         BSA:          1.977 m Patient Age:    44 years          BP:           125/73 mmHg Patient Gender: M                 HR:           73 bpm. Exam Location:   Inpatient Procedure: 2D Echo, Cardiac Doppler and Color Doppler Indications:     CHF-Acute Systolic O53.66  History:         Patient has prior history of Echocardiogram examinations, most                  recent 09/05/2019. COPD, Arrythmias:LBBB,                  Signs/Symptoms:Murmur; Risk Factors:Dyslipidemia and                  Hypertension.                  Aortic Valve: 21 mm bioprosthetic valve is present in the                  aortic position. Procedure Date: 07/29/2019.  Sonographer:     Darlina Sicilian RDCS Referring Phys:  [id number] [place] Diagnosing Phys: Vernell Leep MD  Sonographer Comments: Imaging limited due to BiPap. IMPRESSIONS  1. Left ventricular ejection fraction, by estimation, is 50 to 55%. The left ventricle has low normal function. The left ventricle demonstrates regional wall motion abnormalities (see scoring diagram/findings for description). Left ventricular diastolic  parameters are indeterminate.  2. Right ventricular systolic function is normal. The right ventricular size is normal.  3. The mitral valve is abnormal. Mild mitral valve regurgitation. Mild mitral stenosis. The mean mitral valve gradient is 5.0 mmHg. Moderate to  severe mitral annular calcification.  4. The aortic valve has been repaired/replaced. Aortic valve regurgitation is not visualized. There is a 21 mm bioprosthetic valve present in the aortic position. Procedure Date: 07/29/2019. Echo findings are consistent with normal structure and function of the aortic valve prosthesis.  5. The inferior vena cava is dilated in size with <50% respiratory variability, suggesting right atrial pressure of 15 mmHg. Comparison(s): A prior study was performed on 09/03/2019. LVEF has improved from 40-45%. Tricuspid regurgitation not well appreciated on this study. FINDINGS  Left Ventricle: Left ventricular ejection fraction, by estimation, is 50 to 55%. The left ventricle has low normal function. The left ventricle  demonstrates regional wall motion abnormalities. The left ventricular internal cavity size was normal in size. There is no left ventricular hypertrophy. Abnormal (paradoxical) septal motion consistent with post-operative status. Left ventricular diastolic parameters are indeterminate. Right Ventricle: The right ventricular size is normal. No increase in right ventricular wall thickness. Right ventricular systolic function is normal. Left Atrium: Left atrial size was normal in size. Right Atrium: Right atrial size was normal in size. Pericardium: There is no evidence of pericardial effusion. Mitral Valve: The mitral valve is abnormal. Moderate to severe mitral annular calcification. Mild mitral valve regurgitation. Mild mitral valve stenosis. MV peak gradient, 9.4 mmHg. The mean mitral valve gradient is 5.0 mmHg. Tricuspid Valve: The tricuspid valve is normal in structure. Tricuspid valve regurgitation is not demonstrated. No evidence of tricuspid stenosis. Aortic Valve: Mean PG 13-15 mmHg is acceptable. The aortic valve has been repaired/replaced. Aortic valve regurgitation is not visualized. Aortic valve mean gradient measures 12.3 mmHg. Aortic valve peak gradient measures 23.4 mmHg. Aortic valve area, by  VTI measures 1.82 cm. There is a 21 mm bioprosthetic valve present in the aortic position. Procedure Date: 07/29/2019. Echo findings are consistent with normal structure and function of the aortic valve prosthesis. Pulmonic Valve: The pulmonic valve was grossly normal. Pulmonic valve regurgitation is trivial. Aorta: The aortic root is normal in size and structure. Venous: The inferior vena cava is dilated in size with less than 50% respiratory variability, suggesting right atrial pressure of 15 mmHg. IAS/Shunts: The interatrial septum was not assessed.  LEFT VENTRICLE PLAX 2D LVIDd:         5.60 cm LVIDs:         4.00 cm LV PW:         0.90 cm LV IVS:        0.90 cm LVOT diam:     1.90 cm LV SV:         74 LV SV  Index:   38 LVOT Area:     2.84 cm  LV Volumes (MOD) LV vol d, MOD A2C: 96.4 ml LV vol d, MOD A4C: 128.0 ml LV vol s, MOD A2C: 58.2 ml LV vol s, MOD A4C: 54.4 ml LV SV MOD A2C:     38.2 ml LV SV MOD A4C:     128.0 ml LV SV MOD BP:      56.9 ml RIGHT VENTRICLE TAPSE (M-mode): 1.9 cm LEFT ATRIUM             Index LA diam:        4.40 cm 2.23 cm/m LA Vol (A2C):   53.2 ml 26.88 ml/m LA Vol (A4C):   46.9 ml 23.72 ml/m LA Biplane Vol: 44.8 ml 22.66 ml/m  AORTIC VALVE AV Area (Vmax):    1.67 cm AV Area (Vmean):   1.76 cm AV Area (  VTI):     1.82 cm AV Vmax:           241.67 cm/s AV Vmean:          158.333 cm/s AV VTI:            0.409 m AV Peak Grad:      23.4 mmHg AV Mean Grad:      12.3 mmHg LVOT Vmax:         142.00 cm/s LVOT Vmean:        98.300 cm/s LVOT VTI:          0.262 m LVOT/AV VTI ratio: 0.64  AORTA Ao Asc diam: 3.00 cm MITRAL VALVE MV Area (PHT): 2.34 cm     SHUNTS MV Area VTI:   1.71 cm     Systemic VTI:  0.26 m MV Peak grad:  9.4 mmHg     Systemic Diam: 1.90 cm MV Mean grad:  5.0 mmHg MV Vmax:       1.54 m/s MV Vmean:      103.2 cm/s MV Decel Time: 324 msec MV E velocity: 144.00 cm/s MV A velocity: 110.00 cm/s MV E/A ratio:  1.31 Manish Patwardhan MD Electronically signed by Vernell Leep MD Signature Date/Time: 06/14/2022/2:00:25 PM    Final (Updated)    DG CHEST PORT 1 VIEW  Result Date: 06/15/2022 CLINICAL DATA:  Skin fold.  Assess for pneumothorax EXAM: PORTABLE CHEST 1 VIEW COMPARISON:  Same-day radiograph at 0900 hours FINDINGS: Loop recorder overlies the left chest. Stable heart size. Bilateral airspace opacities. Aeration within the right upper lobe has slightly improved compared to the recent prior exam. Previously seen skin fold is no longer evident. No evidence of pneumothorax. IMPRESSION: 1. No evidence of pneumothorax. 2. Persistent bilateral airspace opacities, slightly improved in the right upper lobe compared to the recent prior exam. Electronically Signed   By: Davina Poke D.O.   On: 06/15/2022 11:14   DG CHEST PORT 1 VIEW  Result Date: 06/15/2022 CLINICAL DATA:  Respiratory failure EXAM: PORTABLE CHEST 1 VIEW COMPARISON:  05/25/2022 FINDINGS: Loop recorder overlies the left chest. Stable heart size. Significant interval worsening of bilateral airspace opacities, most confluent within the right upper lobe. Vertically oriented linear marking at the periphery of the left hemithorax which appears to have lung markings extending peripherally, favored to represent a skin fold. No right-sided pneumothorax. Probable small bilateral pleural effusions. IMPRESSION: 1. Significant interval worsening of bilateral airspace opacities, most confluent within the right upper lobe. 2. Probable skin fold overlying the periphery of the left chest. Recommend repeat upright chest radiograph to exclude the possibility of a small left pneumothorax. These results will be called to the ordering clinician or representative by the Radiologist Assistant, and communication documented in the PACS or [employer]. Electronically Signed   By: Davina Poke D.O.   On: 06/15/2022 10:01   DG Chest Port 1 View  Result Date: 06/02/2022 CLINICAL DATA:  Short of breath EXAM: PORTABLE CHEST 1 VIEW COMPARISON:  08/27/2021, chest CT 02/19/2022 FINDINGS: Electronic device over left chest. Cardiomegaly with vascular congestion. Possible tiny pleural effusions. Patchy bilateral consolidations and ground-glass opacities. Aortic atherosclerosis. Surgical plate and fixating screws in the right clavicle IMPRESSION: 1. Cardiomegaly with vascular congestion and possible tiny pleural effusions. 2. Emphysema. Patchy bilateral ground-glass opacities and consolidations are suspect for bilateral pneumonia Electronically Signed   By: Donavan Foil M.D.   On: 05/25/2022 20:40        Scheduled Meds:  albuterol  2.5 mg Nebulization TID   arformoterol  15 mcg Nebulization BID   aspirin EC  81 mg Oral Daily    atorvastatin  20 mg Oral Daily   budesonide (PULMICORT) nebulizer solution  0.5 mg Nebulization BID   buprenorphine-naloxone  1 tablet Sublingual TID   Chlorhexidine Gluconate Cloth  6 each Topical Daily   dapagliflozin propanediol  10 mg Oral Daily   enoxaparin (LOVENOX) injection  40 mg Subcutaneous Q24H   furosemide  40 mg Intravenous TID   insulin aspart  0-9 Units Subcutaneous TID WC   levothyroxine  175 mcg Oral Q0600   losartan  25 mg Oral Daily   methylPREDNISolone (SOLU-MEDROL) injection  40 mg Intravenous BID   metoprolol tartrate  50 mg Oral TID   pantoprazole  40 mg Oral BID   revefenacin  175 mcg Nebulization Daily   sertraline  200 mg Oral QHS   sodium chloride flush  3 mL Intravenous Q12H   Continuous Infusions:  azithromycin Stopped (06/14/22 2135)   cefTRIAXone (ROCEPHIN)  IV Stopped (06/14/22 2104)     LOS: 2 days    Time spent: 35 minutes.     Elmarie Shiley, MD [facility]   If 7PM-7AM, please contact night-coverage [email or web address]  06/15/2022, 2:29 PM

## 2022-06-15 NOTE — Progress Notes (Signed)
Progress Note  Patient Name: Henry Sheppard Date of Encounter: 06/15/2022  Attending physician: Elmarie Shiley, MD Primary care provider: Lilian Coma., MD Primary Cardiologist: Dr. Virgina Jock   Subjective: Henry Sheppard is a 56 y.o. male who was seen and examined at bedside  Denies CP.  Shortness of breath improved compared to yesterday - less wheezing, rales, and not using accessory muscles for breathing.  Currently on HFNC.  Needed BiPAP yesterday night. Case discussed and reviewed with his nurse.  Objective: Vital Signs in the last 24 hours: Temp:  [97.3 F (36.3 C)-100.9 F (38.3 C)] 98.1 F (36.7 C) (07/29 1242) Pulse Rate:  [67-103] 95 (07/29 1100) Resp:  [19-35] 25 (07/29 1100) BP: (116-163)/(57-81) 137/57 (07/29 1100) SpO2:  [86 %-96 %] 93 % (07/29 1100) FiO2 (%):  [60 %-70 %] 70 % (07/29 1100)  Intake/Output:  Intake/Output Summary (Last 24 hours) at 06/15/2022 1330 Last data filed at 06/15/2022 1100 Gross per 24 hour  Intake 395.77 ml  Output 3325 ml  Net -2929.23 ml    Net IO Since Admission: -4,479.23 mL [06/15/22 1330]  Weights:  Filed Weights   06/10/2022 1939 06/14/22 1045  Weight: 80.3 kg 78 kg    Telemetry: Personally reviewed. SR w/ ectopy.   Physical examination: PHYSICAL EXAM:    06/15/2022   11:00 AM 06/15/2022   10:45 AM 06/15/2022    8:06 AM  Vitals with BMI  Systolic 478    Diastolic 57    Pulse 95 98 85    CONSTITUTIONAL: Appears older than stated age, hemodynamically stable, on HFNC SKIN: Skin is warm and dry. No rash noted.  HEAD: Normocephalic and atraumatic.  EYES: No scleral icterus.  No xanthelasmas MOUTH/THROAT: Moist oral membranes.  NECK: JVD present. No thyromegaly noted. No carotid bruits  CHEST Normal respiratory effort. No intercostal retractions  LUNGS: Decreased breath sounds bilaterally, less expiratory wheezing and rales noted bilaterally. CARDIOVASCULAR: Regular, positive S1-S2, no murmurs rubs or  gallops appreciated secondary to tachycardia, sternotomy site is well-healed.  ABDOMINAL: Soft, nontender, nondistended, positive bowel sounds in all 4 quadrants, no apparent ascites.  EXTREMITIES: No pitting edema, warm to touch, 2+ bilateral posterior tibial pulses. HEMATOLOGIC: No significant bruising NEUROLOGIC: Oriented to person, place, and time. Nonfocal. Normal muscle tone.  PSYCHIATRIC: Normal mood and affect. Normal behavior. Cooperative  Lab Results: Hematology Recent Labs  Lab 05/28/2022 2040 06/14/22 0530 06/15/22 0303  WBC 14.1* 15.9* 17.0*  RBC 4.89 4.84 5.20  HGB 14.1 14.0 15.0  HCT 44.3 44.0 47.2  MCV 90.6 90.9 90.8  MCH 28.8 28.9 28.8  MCHC 31.8 31.8 31.8  RDW 18.3* 18.0* 18.7*  PLT 221 218 223    Chemistry Recent Labs  Lab 06/11/2022 2040 06/14/22 0530 06/15/22 0303  NA 133* 138 136  K 3.5 3.8 4.0  CL 96* 101 94*  CO2 '28 29 30  '$ GLUCOSE 135* 129* 126*  BUN '9 10 14  '$ CREATININE 0.58* 0.53* 0.57*  CALCIUM 8.6* 9.2 8.8*  PROT  --  7.5 7.5  ALBUMIN  --  3.4* 3.2*  AST  --  26 29  ALT  --  13 13  ALKPHOS  --  107 98  BILITOT  --  0.9 1.1  GFRNONAA >60 >60 >60  ANIONGAP '9 8 12     '$ Cardiac Enzymes: Cardiac Panel (last 3 results) Recent Labs    06/12/2022 0050 05/24/2022 2040  TROPONINIHS 6 6    BNP (last 3 results) Recent Labs  05/29/2022 2040  BNP 436.7*    ProBNP (last 3 results) No results for input(s): "PROBNP" in the last 8760 hours.   DDimer No results for input(s): "DDIMER" in the last 168 hours.   Hemoglobin A1c:  Lab Results  Component Value Date   HGBA1C 6.3 (H) 07/27/2019   MPG 134 07/27/2019    TSH  Recent Labs    06/14/22 0830  TSH 3.674    Lipid Panel No results found for: "CHOL", "TRIG", "HDL", "CHOLHDL", "VLDL", "LDLCALC", "LDLDIRECT"  Imaging: DG CHEST PORT 1 VIEW  Result Date: 06/15/2022 CLINICAL DATA:  Skin fold.  Assess for pneumothorax EXAM: PORTABLE CHEST 1 VIEW COMPARISON:  Same-day radiograph at 0900  hours FINDINGS: Loop recorder overlies the left chest. Stable heart size. Bilateral airspace opacities. Aeration within the right upper lobe has slightly improved compared to the recent prior exam. Previously seen skin fold is no longer evident. No evidence of pneumothorax. IMPRESSION: 1. No evidence of pneumothorax. 2. Persistent bilateral airspace opacities, slightly improved in the right upper lobe compared to the recent prior exam. Electronically Signed   By: Davina Poke D.O.   On: 06/15/2022 11:14   DG CHEST PORT 1 VIEW  Result Date: 06/15/2022 CLINICAL DATA:  Respiratory failure EXAM: PORTABLE CHEST 1 VIEW COMPARISON:  05/18/2022 FINDINGS: Loop recorder overlies the left chest. Stable heart size. Significant interval worsening of bilateral airspace opacities, most confluent within the right upper lobe. Vertically oriented linear marking at the periphery of the left hemithorax which appears to have lung markings extending peripherally, favored to represent a skin fold. No right-sided pneumothorax. Probable small bilateral pleural effusions. IMPRESSION: 1. Significant interval worsening of bilateral airspace opacities, most confluent within the right upper lobe. 2. Probable skin fold overlying the periphery of the left chest. Recommend repeat upright chest radiograph to exclude the possibility of a small left pneumothorax. These results will be called to the ordering clinician or representative by the Radiologist Assistant, and communication documented in the PACS or [employer]. Electronically Signed   By: Davina Poke D.O.   On: 06/15/2022 10:01   ECHOCARDIOGRAM COMPLETE  Result Date: 06/14/2022    ECHOCARDIOGRAM REPORT   Patient Name:   Henry Sheppard Date of Exam: 06/14/2022 Medical Rec #:  [id number]         Height:       71.0 in Accession #:    [id number]        Weight:       172.0 lb Date of Birth:  11-28-1965         BSA:          1.977 m Patient Age:    40 years          BP:            125/73 mmHg Patient Gender: M                 HR:           73 bpm. Exam Location:  Inpatient Procedure: 2D Echo, Cardiac Doppler and Color Doppler Indications:     CHF-Acute Systolic N23.55  History:         Patient has prior history of Echocardiogram examinations, most                  recent 09/05/2019. COPD, Arrythmias:LBBB,                  Signs/Symptoms:Murmur; Risk Factors:Dyslipidemia and  Hypertension.                  Aortic Valve: 21 mm bioprosthetic valve is present in the                  aortic position. Procedure Date: 07/29/2019.  Sonographer:     Darlina Sicilian RDCS Referring Phys:  [id number] [place] Diagnosing Phys: Vernell Leep MD  Sonographer Comments: Imaging limited due to BiPap. IMPRESSIONS  1. Left ventricular ejection fraction, by estimation, is 50 to 55%. The left ventricle has low normal function. The left ventricle demonstrates regional wall motion abnormalities (see scoring diagram/findings for description). Left ventricular diastolic  parameters are indeterminate.  2. Right ventricular systolic function is normal. The right ventricular size is normal.  3. The mitral valve is abnormal. Mild mitral valve regurgitation. Mild mitral stenosis. The mean mitral valve gradient is 5.0 mmHg. Moderate to severe mitral annular calcification.  4. The aortic valve has been repaired/replaced. Aortic valve regurgitation is not visualized. There is a 21 mm bioprosthetic valve present in the aortic position. Procedure Date: 07/29/2019. Echo findings are consistent with normal structure and function of the aortic valve prosthesis.  5. The inferior vena cava is dilated in size with <50% respiratory variability, suggesting right atrial pressure of 15 mmHg. Comparison(s): A prior study was performed on 09/03/2019. LVEF has improved from 40-45%. Tricuspid regurgitation not well appreciated on this study. FINDINGS  Left Ventricle: Left ventricular ejection fraction, by  estimation, is 50 to 55%. The left ventricle has low normal function. The left ventricle demonstrates regional wall motion abnormalities. The left ventricular internal cavity size was normal in size. There is no left ventricular hypertrophy. Abnormal (paradoxical) septal motion consistent with post-operative status. Left ventricular diastolic parameters are indeterminate. Right Ventricle: The right ventricular size is normal. No increase in right ventricular wall thickness. Right ventricular systolic function is normal. Left Atrium: Left atrial size was normal in size. Right Atrium: Right atrial size was normal in size. Pericardium: There is no evidence of pericardial effusion. Mitral Valve: The mitral valve is abnormal. Moderate to severe mitral annular calcification. Mild mitral valve regurgitation. Mild mitral valve stenosis. MV peak gradient, 9.4 mmHg. The mean mitral valve gradient is 5.0 mmHg. Tricuspid Valve: The tricuspid valve is normal in structure. Tricuspid valve regurgitation is not demonstrated. No evidence of tricuspid stenosis. Aortic Valve: Mean PG 13-15 mmHg is acceptable. The aortic valve has been repaired/replaced. Aortic valve regurgitation is not visualized. Aortic valve mean gradient measures 12.3 mmHg. Aortic valve peak gradient measures 23.4 mmHg. Aortic valve area, by  VTI measures 1.82 cm. There is a 21 mm bioprosthetic valve present in the aortic position. Procedure Date: 07/29/2019. Echo findings are consistent with normal structure and function of the aortic valve prosthesis. Pulmonic Valve: The pulmonic valve was grossly normal. Pulmonic valve regurgitation is trivial. Aorta: The aortic root is normal in size and structure. Venous: The inferior vena cava is dilated in size with less than 50% respiratory variability, suggesting right atrial pressure of 15 mmHg. IAS/Shunts: The interatrial septum was not assessed.  LEFT VENTRICLE PLAX 2D LVIDd:         5.60 cm LVIDs:         4.00 cm LV  PW:         0.90 cm LV IVS:        0.90 cm LVOT diam:     1.90 cm LV SV:  74 LV SV Index:   38 LVOT Area:     2.84 cm  LV Volumes (MOD) LV vol d, MOD A2C: 96.4 ml LV vol d, MOD A4C: 128.0 ml LV vol s, MOD A2C: 58.2 ml LV vol s, MOD A4C: 54.4 ml LV SV MOD A2C:     38.2 ml LV SV MOD A4C:     128.0 ml LV SV MOD BP:      56.9 ml RIGHT VENTRICLE TAPSE (M-mode): 1.9 cm LEFT ATRIUM             Index LA diam:        4.40 cm 2.23 cm/m LA Vol (A2C):   53.2 ml 26.88 ml/m LA Vol (A4C):   46.9 ml 23.72 ml/m LA Biplane Vol: 44.8 ml 22.66 ml/m  AORTIC VALVE AV Area (Vmax):    1.67 cm AV Area (Vmean):   1.76 cm AV Area (VTI):     1.82 cm AV Vmax:           241.67 cm/s AV Vmean:          158.333 cm/s AV VTI:            0.409 m AV Peak Grad:      23.4 mmHg AV Mean Grad:      12.3 mmHg LVOT Vmax:         142.00 cm/s LVOT Vmean:        98.300 cm/s LVOT VTI:          0.262 m LVOT/AV VTI ratio: 0.64  AORTA Ao Asc diam: 3.00 cm MITRAL VALVE MV Area (PHT): 2.34 cm     SHUNTS MV Area VTI:   1.71 cm     Systemic VTI:  0.26 m MV Peak grad:  9.4 mmHg     Systemic Diam: 1.90 cm MV Mean grad:  5.0 mmHg MV Vmax:       1.54 m/s MV Vmean:      103.2 cm/s MV Decel Time: 324 msec MV E velocity: 144.00 cm/s MV A velocity: 110.00 cm/s MV E/A ratio:  1.31 Manish Patwardhan MD Electronically signed by Vernell Leep MD Signature Date/Time: 06/14/2022/2:00:25 PM    Final    DG Chest Port 1 View  Result Date: 05/21/2022 CLINICAL DATA:  Short of breath EXAM: PORTABLE CHEST 1 VIEW COMPARISON:  08/27/2021, chest CT 02/19/2022 FINDINGS: Electronic device over left chest. Cardiomegaly with vascular congestion. Possible tiny pleural effusions. Patchy bilateral consolidations and ground-glass opacities. Aortic atherosclerosis. Surgical plate and fixating screws in the right clavicle IMPRESSION: 1. Cardiomegaly with vascular congestion and possible tiny pleural effusions. 2. Emphysema. Patchy bilateral ground-glass opacities and  consolidations are suspect for bilateral pneumonia Electronically Signed   By: Donavan Foil M.D.   On: 05/30/2022 20:40    CARDIAC DATABASE: CARDIAC DATABASE: EKG: 06/17/2022: Sinus rhythm, 87 bpm, left bundle branch block, PACs, ST-T changes likely secondary to LBBB but ischemia cannot be entirely ruled out.   Echocardiogram: 06/14/2022  1. Left ventricular ejection fraction, by estimation, is 50 to 55%. The  left ventricle has low normal function. The left ventricle demonstrates  regional wall motion abnormalities (see scoring diagram/findings for  description). Left ventricular diastolic   parameters are indeterminate.   2. Right ventricular systolic function is normal. The right ventricular  size is normal.   3. The mitral valve is abnormal. Mild mitral valve regurgitation. Mild  mitral stenosis. The mean mitral valve gradient is 5.0 mmHg. Moderate to  severe mitral annular  calcification.   4. The aortic valve has been repaired/replaced. Aortic valve  regurgitation is not visualized. There is a 21 mm bioprosthetic valve  present in the aortic position. Procedure Date: 07/29/2019. Echo findings  are consistent with normal structure and  function of the aortic valve prosthesis.   5. The inferior vena cava is dilated in size with <50% respiratory  variability, suggesting right atrial pressure of 15 mmHg.  Comparison(s): A prior study was performed on 09/03/2019. LVEF has  improved from 40-45%. Tricuspid regurgitation not well appreciated on this  study.    Op note 07/29/2019 Dr Cyndia Bent: Redo median Sternotomy Extracorporeal circulation Aortic root replacement using a 21 mm Medtronic Freestyle aortic bioprosthesis   Cath 12/22/2018: Mild nonobstructive CAD (Prox RCA 40% stenosis) Severe bioprothetic aortic valve stenosis WHO Grp II moderate pulmonary hypertension (Mean PA 25 mmHg) Elevated filling pressures (PCWP 25 mmHg, LVEDP 26 mmHg)   Recommendation: Surgical consult for  redo aortic valve replacement surgery.  Scheduled Meds:  albuterol  2.5 mg Nebulization TID   arformoterol  15 mcg Nebulization BID   aspirin EC  81 mg Oral Daily   atorvastatin  20 mg Oral Daily   budesonide (PULMICORT) nebulizer solution  0.5 mg Nebulization BID   buprenorphine-naloxone  1 tablet Sublingual TID   Chlorhexidine Gluconate Cloth  6 each Topical Daily   dapagliflozin propanediol  10 mg Oral Daily   enoxaparin (LOVENOX) injection  40 mg Subcutaneous Q24H   furosemide  40 mg Intravenous TID   insulin aspart  0-9 Units Subcutaneous TID WC   levothyroxine  175 mcg Oral Q0600   losartan  25 mg Oral Daily   methylPREDNISolone (SOLU-MEDROL) injection  40 mg Intravenous BID   metoprolol tartrate  50 mg Oral TID   pantoprazole  40 mg Oral BID   revefenacin  175 mcg Nebulization Daily   sertraline  200 mg Oral QHS   sodium chloride flush  3 mL Intravenous Q12H    Continuous Infusions:  azithromycin Stopped (06/14/22 2135)   cefTRIAXone (ROCEPHIN)  IV Stopped (06/14/22 2104)    PRN Meds: acetaminophen **OR** acetaminophen, albuterol, aspirin-acetaminophen-caffeine, butalbital-acetaminophen-caffeine, midazolam, ondansetron (ZOFRAN) IV, mouth rinse, polyethylene glycol   IMPRESSION & RECOMMENDATIONS: Henry Sheppard is a 56 y.o. 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  Impression: Acute exacerbation of heart failure with systolic and diastolic EF.   Acute on chronic hypoxemic respiratory failure  due to CHF exacerbation/community-acquired pneumonia in the setting of adenovirus infection/underlying COPD with chronic oxygen dependence   Status post redo sternotomy and aortic root replacement with 21 mm Medtronic freestyle aortic prosthesis   Left bundle branch block   COPD/nicotine dependence (quit smoking 1 week ago)/nocturnal oxygen dependence   History of opioid use   Hypothyroidism   Hypertension.  Hyperlipidemia  Recommendations.  Acute exacerbation of heart failure with systolic and diastolic EF: Likely precipitated by underlying COPD/oxygen dependence in the setting of community-acquired pneumonia in the setting of adenovirus.  Patient states that he has been compliant with his heart failure medications on outpatient basis.  Net IO Since Admission: -4,479.23 mL [  06/15/22 1330]  Increase Lasix to 40 mg IV push 3 times daily.   Started on Farxiga with no known intolerances.   Continue losartan and Lopressor. We will focus on diuresis for now and uptitrate his GDMT prior to discharge.  Telemetry reviewed.  We will check fasting lipid profile direct LDL in the morning.  We will defer BiPAP/high flow nasal cannula oxygen management to ICU team.  Status post redo sternotomy and aortic root replacement with 21 mm Medtronic freestyle aortic prosthesis: Echocardiogram results. We will need antibiotic prophylaxis long-term for procedures.  Benign essential hypertension: Improving. Continue current medical therapy. If additional antihypertensive medications needed would recommend Bidil.  We will defer management of his other active cardiovascular comorbid conditions to [facility] and primary medicine.  Their progress notes reviewed.  Plan of care discussed with patient and nursing staff.  We will continue to follow.  Patient's questions and concerns were addressed to his satisfaction. He voices understanding of the instructions provided during this encounter.   This note  was created using a voice recognition software as a result there may be grammatical errors inadvertently enclosed that do not reflect the nature of this encounter. Every attempt is made to correct such errors.  Mechele Claude [facility]  Pager: [phone] Office: [phone] 06/15/2022, 1:30 PM

## 2022-06-15 NOTE — Progress Notes (Signed)
[facility] ADULT ICU REPLACEMENT PROTOCOL   The patient does apply for the [facility] Adult ICU Electrolyte Replacment Protocol based on the criteria listed below:   1.Exclusion criteria: TCTS patients, ECMO patients, and Dialysis patients 2. Is GFR >/= 30 ml/min? Yes.    Patient's GFR today is >60 3. Is SCr </= 2? Yes.   Patient's SCr is 0.57 mg/dL 4. Did SCr increase >/= 0.5 in 24 hours? No. 5.Pt's weight >40kg  Yes.   6. Abnormal electrolyte(s): mag 1.9  7. Electrolytes replaced per protocol 8.  Call MD STAT for K+ </= 2.5, Phos </= 1, or Mag </= 1 Physician:  n/a  Darlys Gales 06/15/2022 4:36 AM

## 2022-06-15 NOTE — Progress Notes (Addendum)
NAME:  Henry Sheppard, MRN:  [id number], DOB:  Apr 14, 1966, LOS: 2 ADMISSION DATE:  06/08/2022, CONSULTATION DATE:  7/28 REFERRING MD:  Dr. Tyrell Antonio, CHIEF COMPLAINT:  SOB   History of Present Illness:  56 y/o M who presented to [facility] ER on 7/27 with reports of 2 days of progressive shortness of breath.    Initially there was some question of patient compliance with medications due to insurance issues but he reports that EMS misunderstood and that he has been taking his medications appropriately.  He reports he is not on chronic narcotics at this point.  States that he wears 3 L of oxygen at night for sleep.  He reports he has had at least 3 days of increasing shortness of breath, cough with yellow sputum production and 2 weeks of decreased activity tolerance.  He reports he continues to smoke 1 pack/day.  At home he noted his room air saturations dropped into the 60s with exertion.  He reports chills but no documented fever.  Initial ER evaluation notable for negative COVID screening.  RVP pending.  Initial labs notable for NA 133, K3.5, BUN 9, creatinine 0.58, BNP 436, troponin 6, WBC 14.1, hemoglobin 14.1, and platelets 221.  EKG notable for sinus rhythm with PAC and left bundle branch block.  Chest x-ray consistent with baseline emphysema, cardiomegaly with vascular congestion and patchy groundglass opacities.  Patient required BiPAP support on the a.m. of 7/28 for increased work of breathing.  He was given Lasix with at least 1.6 L urinary output in the emergency room.  PCCM consulted for pulmonary evaluation.  Pertinent  Medical History  COPD  Chronic Hypoxic Respiratory Failure - 2L QHS O2  Anxiety / Depression  Chronic Pain, Narcotic Dependence  HTN  HLD LBBB Murmur  Moderate to Severe Aortic Stenosis s/p AVR Arthritis   Significant Hospital Events: Including procedures, antibiotic start and stop dates in addition to other pertinent events   7/27 Admit per [facility] for SOB 7/28 PCCM  consulted for pulmonary evaluation   Interim History / Subjective:   Continues on bipap. In resp distress today AM  Objective   Blood pressure (!) 163/72, pulse 73, temperature (!) 97.3 F (36.3 C), temperature source Oral, resp. rate (!) 24, height '5\' 11"'$  (1.803 m), weight 78 kg, SpO2 96 %.    FiO2 (%):  [40 %-50 %] 50 %   Intake/Output Summary (Last 24 hours) at 06/15/2022 0739 Last data filed at 06/15/2022 7322 Gross per 24 hour  Intake 356.7 ml  Output 3425 ml  Net -3068.3 ml   Filed Weights   06/03/2022 1939 06/14/22 1045  Weight: 80.3 kg 78 kg    Examination: Blood pressure (!) 163/72, pulse 73, temperature (!) 97.3 F (36.3 C), temperature source Oral, resp. rate (!) 24, height '5\' 11"'$  (1.803 m), weight 78 kg, SpO2 96 %. Gen:      Moderate distress on BiPAP HEENT:  EOMI, sclera anicteric Neck:     No masses; no thyromegaly, positive JVD Lungs:   Bilateral crackles, wheeze CV:         Regular rate and rhythm; no murmurs Abd:      + bowel sounds; soft, non-tender; no palpable masses, no distension Ext:    No edema; adequate peripheral perfusion Skin:      Warm and dry; no rash Neuro: Awake, responsive  Labs/imaging reviewed Significant for WBC of 17 No new imaging  Resolved Hospital Problem list     Assessment & Plan:  Acute on Chronic Hypoxic Respiratory Failure  COPD with Acute Exacerbation Community acquired PNA in setting of adenovirus infection Continue supplemental oxygen, Bipap Ceftriaxone, azithromycin for community-acquired pneumonia, COPD exacerbation Lasix as tolerated. Give additional dose today AM Repeat CXR Incentive spirometry, mobilize Continue Brovana, Pulmicort and Yupelri Start solmedrol '40mg'$  bid as he is wheezing on examination  Acute Systolic HF Exacerbation  HTN, HLD, LBBB Hx AS s/p Tissue AVR Cardiology on board Lasix for diuresis  Anxiety  Per [facility]   Hx Opioid Use Disorder  Suboxone per [facility]   Weight Loss  ? Pulmonary  cachexia  -HIV, TSH per primary   Hypothyroidism  Per [facility]   DM II  Per [facility]  Best Practice (right click and "Reselect all SmartList Selections" daily)  Per [facility] / Primary SVC   Critical care time:   The patient is critically ill with multiple organ system failure and requires high complexity decision making for assessment and support, frequent evaluation and titration of therapies, advanced monitoring, review of radiographic studies and interpretation of complex data.   Critical Care Time devoted to patient care services, exclusive of separately billable procedures, described in this note is 45 minutes.   Marshell Garfinkel MD [facility] See Amion for pager  If no response to pager , please call [phone] until 7pm After 7:00 pm call Elink  [phone] 06/15/2022, 8:45 AM

## 2022-06-15 NOTE — Progress Notes (Signed)
Patient does not want to wear BIPAP tonight . Patient states that the [facility] is working fine

## 2022-06-16 DIAGNOSIS — J9621 Acute and chronic respiratory failure with hypoxia: Secondary | ICD-10-CM | POA: Diagnosis not present

## 2022-06-16 LAB — BASIC METABOLIC PANEL
Anion gap: 12 (ref 5–15)
BUN: 24 mg/dL — ABNORMAL HIGH (ref 6–20)
CO2: 34 mmol/L — ABNORMAL HIGH (ref 22–32)
Calcium: 9.5 mg/dL (ref 8.9–10.3)
Chloride: 92 mmol/L — ABNORMAL LOW (ref 98–111)
Creatinine, Ser: 0.79 mg/dL (ref 0.61–1.24)
GFR, Estimated: >60 mL/min (ref >60)
Glucose, Bld: 138 mg/dL — ABNORMAL HIGH (ref 70–99)
Potassium: 3.5 mmol/L (ref 3.5–5.1)
Sodium: 138 mmol/L (ref 135–145)

## 2022-06-16 LAB — LIPID PANEL
Cholesterol: 90 mg/dL (ref 0–200)
HDL: 28 mg/dL — ABNORMAL LOW (ref >40)
LDL Cholesterol: 49 mg/dL (ref 0–99)
Total CHOL/HDL Ratio: 3.2 RATIO
Triglycerides: 66 mg/dL (ref <150)
VLDL: 13 mg/dL (ref 0–40)

## 2022-06-16 LAB — GLUCOSE, CAPILLARY
Glucose-Capillary: 171 mg/dL — ABNORMAL HIGH (ref 70–99)
Glucose-Capillary: 125 mg/dL — ABNORMAL HIGH (ref 70–99)

## 2022-06-16 LAB — CBC
HCT: 50.1 % (ref 39.0–52.0)
Hemoglobin: 16.2 g/dL (ref 13.0–17.0)
MCH: 29.1 pg (ref 26.0–34.0)
MCHC: 32.3 g/dL (ref 30.0–36.0)
MCV: 90.1 fL (ref 80.0–100.0)
Platelets: 270 10*3/uL (ref 150–400)
RBC: 5.56 MIL/uL (ref 4.22–5.81)
RDW: 18.5 % — ABNORMAL HIGH (ref 11.5–15.5)
WBC: 14.3 10*3/uL — ABNORMAL HIGH (ref 4.0–10.5)
nRBC: 0 % (ref 0.0–0.2)

## 2022-06-16 LAB — LDL CHOLESTEROL, DIRECT: Direct LDL: 44 mg/dL (ref 0–99)

## 2022-06-16 LAB — PROCALCITONIN: Procalcitonin: 0.2 ng/mL

## 2022-06-16 LAB — BRAIN NATRIURETIC PEPTIDE: B Natriuretic Peptide: 823.9 pg/mL — ABNORMAL HIGH (ref 0.0–100.0)

## 2022-06-16 MED ORDER — FUROSEMIDE 10 MG/ML IJ SOLN: 80.0000 mg | Freq: Three times a day (TID) | INTRAMUSCULAR | Status: DC

## 2022-06-16 MED ORDER — DEXMEDETOMIDINE HCL IN NACL 200 MCG/50ML IV SOLN
INTRAVENOUS | Status: AC
  Filled 2022-06-16: qty 50

## 2022-06-16 MED ORDER — DEXMEDETOMIDINE HCL IN NACL 200 MCG/50ML IV SOLN
0.4000 ug/kg/h | INTRAVENOUS | Status: DC
  Administered 2022-06-16: 0.4 ug/kg/h via INTRAVENOUS
  Administered 2022-06-16 – 2022-06-17 (×4): 0.6 ug/kg/h via INTRAVENOUS
  Filled 2022-06-16 (×4): qty 50

## 2022-06-16 MED ORDER — METOLAZONE 2.5 MG PO TABS
2.5000 mg | ORAL_TABLET | Freq: Every day | ORAL | Status: DC
Start: 2022-06-16 — End: 2022-06-17
  Filled 2022-06-16 (×2): qty 1

## 2022-06-16 MED ORDER — DEXMEDETOMIDINE HCL IN NACL 400 MCG/100ML IV SOLN
INTRAVENOUS | Status: AC
  Filled 2022-06-16: qty 100

## 2022-06-16 MED ORDER — POTASSIUM CHLORIDE CRYS ER 20 MEQ PO TBCR
40.0000 meq | EXTENDED_RELEASE_TABLET | Freq: Once | ORAL | Status: DC
  Filled 2022-06-16: qty 2

## 2022-06-16 MED ORDER — FUROSEMIDE 10 MG/ML IJ SOLN
80.0000 mg | Freq: Two times a day (BID) | INTRAMUSCULAR | Status: DC
  Administered 2022-06-16: 80 mg via INTRAVENOUS
  Administered 2022-06-16: 40 mg via INTRAVENOUS
  Administered 2022-06-17: 80 mg via INTRAVENOUS
  Filled 2022-06-16 (×3): qty 8

## 2022-06-16 NOTE — Progress Notes (Signed)
NAME:  Henry Sheppard, MRN:  [id number], DOB:  [birth year], LOS: 3 ADMISSION DATE:  06/10/2022, CONSULTATION DATE:  7/28 REFERRING MD:  Dr. Tyrell Antonio, CHIEF COMPLAINT:  SOB   History of Present Illness:  56 y/o M who presented to [facility] ER on 7/27 with reports of 2 days of progressive shortness of breath.    Initially there was some question of patient compliance with medications due to insurance issues but he reports that EMS misunderstood and that he has been taking his medications appropriately.  He reports he is not on chronic narcotics at this point.  States that he wears 3 L of oxygen at night for sleep.  He reports he has had at least 3 days of increasing shortness of breath, cough with yellow sputum production and 2 weeks of decreased activity tolerance.  He reports he continues to smoke 1 pack/day.  At home he noted his room air saturations dropped into the 60s with exertion.  He reports chills but no documented fever.  Initial ER evaluation notable for negative COVID screening.  RVP pending.  Initial labs notable for NA 133, K3.5, BUN 9, creatinine 0.58, BNP 436, troponin 6, WBC 14.1, hemoglobin 14.1, and platelets 221.  EKG notable for sinus rhythm with PAC and left bundle branch block.  Chest x-ray consistent with baseline emphysema, cardiomegaly with vascular congestion and patchy groundglass opacities.  Patient required BiPAP support on the a.m. of 7/28 for increased work of breathing.  He was given Lasix with at least 1.6 L urinary output in the emergency room.  PCCM consulted for pulmonary evaluation.  Pertinent  Medical History  COPD  Chronic Hypoxic Respiratory Failure - 2L QHS O2  Anxiety / Depression  Chronic Pain, Narcotic Dependence  HTN  HLD LBBB Murmur  Moderate to Severe Aortic Stenosis s/p AVR Arthritis   Significant Hospital Events: Including procedures, antibiotic start and stop dates in addition to other pertinent events   7/27 Admit per [facility] for SOB 7/28 PCCM  consulted for pulmonary evaluation  7/29 worsening respiratory distress, chest x-ray worse 7/30 starting Precedex, continue intermittent BiPAP.  Made DNR per patient preference.  Interim History / Subjective:   Continues to have recurrent attacks of respiratory distress.  Transition to high flow nasal cannula as he had nausea yesterday and BiPAP was taken off Today morning he had another episode of acute distress with paradoxical breathing, marked dyspnea.  Objective   Blood pressure (!) 98/57, pulse (!) 56, temperature (!) 97 F (36.1 C), temperature source Axillary, resp. rate 17, height '5\' 11"'$  (1.803 m), weight 74.1 kg, SpO2 94 %.    FiO2 (%):  [70 %] 70 %   Intake/Output Summary (Last 24 hours) at 06/16/2022 0842 Last data filed at 06/16/2022 0177 Gross per 24 hour  Intake 350 ml  Output 2350 ml  Net -2000 ml   Filed Weights   05/22/2022 1939 06/14/22 1045 06/16/22 0651  Weight: 80.3 kg 78 kg 74.1 kg    Examination: Blood pressure (!) 98/57, pulse (!) 56, temperature 97.8 F (36.6 C), temperature source Oral, resp. rate 17, height '5\' 11"'$  (1.803 m), weight 74.1 kg, SpO2 94 %. Gen:      Moderate distress HEENT:  EOMI, sclera anicteric Neck:     No masses; no thyromegaly Lungs:    Diminished breath sounds.  No crackles or wheeze noted today CV:         Regular rate and rhythm; no murmurs Abd:      +  bowel sounds; soft, non-tender; no palpable masses, no distension Ext:    No edema; adequate peripheral perfusion Skin:      Warm and dry; no rash Neuro: alert and oriented x 3 Psych: normal mood and affect   Labs/imaging reviewed WBC improved to 14.3 Elevated at 34, creatinine 0.79 Chest x-ray with persistent bilateral opacities which is most prominent in the right upper lobe.  Resolved Hospital Problem list     Assessment & Plan:  Acute on Chronic Hypoxic Respiratory Failure  COPD with Acute Exacerbation Community acquired PNA in setting of adenovirus infection Continue  supplemental oxygen Ceftriaxone, azithromycin for community-acquired pneumonia, COPD exacerbation Continue Brovana, Pulmicort and Yupelri On solmedrol '40mg'$  bid  Anxiety is a significant component of intolerance to BiPAP.  We will try Precedex  Acute Systolic HF Exacerbation  HTN, HLD, LBBB Hx AS s/p Tissue AVR Cardiology on board Lasix for diuresis  Anxiety  Per [facility]   Hx Opioid Use Disorder  Suboxone per [facility]   Weight Loss  ? Pulmonary cachexia  -HIV, TSH per primary   Hypothyroidism  Per [facility]   DM II  Per [facility]  Goals of care DNR.  See separate note.  Best Practice (right click and "Reselect all SmartList Selections" daily)  Per [facility] / Primary SVC   Critical care time:   The patient is critically ill with multiple organ system failure and requires high complexity decision making for assessment and support, frequent evaluation and titration of therapies, advanced monitoring, review of radiographic studies and interpretation of complex data.   Critical Care Time devoted to patient care services, exclusive of separately billable procedures, described in this note is 45 minutes.   Marshell Garfinkel MD  Pulmonary & Critical care See Amion for pager  If no response to pager , please call [phone] until 7pm After 7:00 pm call Elink  [phone] 06/16/2022, 8:42 AM

## 2022-06-16 NOTE — Progress Notes (Signed)
[facility] ADULT ICU REPLACEMENT PROTOCOL   The patient does apply for the [facility] Adult ICU Electrolyte Replacment Protocol based on the criteria listed below:   1.Exclusion criteria: TCTS patients, ECMO patients, and Dialysis patients 2. Is GFR >/= 30 ml/min? Yes.    Patient's GFR today is >60 3. Is SCr </= 2? Yes.   Patient's SCr is 0.79 mg/dL 4. Did SCr increase >/= 0.5 in 24 hours? No. 5.Pt's weight >40kg  Yes.   6. Abnormal electrolyte(s): K+ 3.5  7. Electrolytes replaced per protocol 8.  Call MD STAT for K+ </= 2.5, Phos </= 1, or Mag </= 1 Physician:  n/a  Darlys Gales 06/16/2022 4:42 AM

## 2022-06-16 NOTE — Progress Notes (Addendum)
PROGRESS NOTE    Henry Sheppard  STM:[id number] DOB: [birth year] DOA: 06/05/2022 PCP: Lilian Coma., MD   Brief Narrative: 56 year old with past medical history significant for anemia, pulmonary hypertension, CHF per lipidemia, status post aortic valve replacement x2, hypertension, CAD, Hodgkin lymphoma, COPD, chronic nightly oxygen who presents complaining of worsening shortness of breath that is started 2 days prior to admission.  He was found to have oxygen saturation 67 on room air and improved to 89 on 5 L.  He reports productive cough and headaches.  Patient admitted with acute on chronic hypoxic respiratory failure, multifactorial secondary to pneumonia, COPD exacerbation and or heart failure exacerbation.   Assessment & Plan:   Principal Problem:   Acute on chronic respiratory failure with hypoxia (HCC) Active Problems:   Other secondary pulmonary hypertension (HCC)   HFrEF (heart failure with reduced ejection fraction) (HCC)   S/P AVR   Essential hypertension   Prosthetic aortic valve stenosis   Narcotic dependence, in remission ([place])   Hyperlipidemia, group A   Hodgkins lymphoma ([place])   Coronary artery disease involving native coronary artery of native heart without angina pectoris   Gastroesophageal reflux disease without esophagitis   Personal history of Hodgkin lymphoma   On home oxygen therapy   Type 2 diabetes mellitus without complication, without long-term current use of insulin (HCC)   Hypothyroidism   COPD with acute exacerbation (HCC)   Respiratory failure (HCC)   Acute on chronic heart failure with preserved ejection fraction (HCC)   1-Acute on Chronic Hypoxic Respiratory Failure:  Acute systolic and Diastolic  heart failure exacerbation Pneumonia COPD  Adenovirus  -Patient presented with worsening shortness of breath, cough, leukocytosis, elevated BNP, chest x-ray patchy bilateral groundglass opacity consistent with pneumonia also degree of vascular  congestion. He has bilateral crackles and positive  JVD. -Continue with IV ceftriaxone and azithromycin -Continue with scheduled nebulizer.  Continue with Candiss Norse -Cardiology  consulted. Increasing lasix, adding Zaroxolyn.  -ECHO ; EF 55 % regional wall motion abnormalities. Bioprosthetic valve present.  Continue with  IV steroids.  Chest x ray: Persistent BL airspace opacities, slightly improve RU lobe.  On BIPAP and precedex. He is tolerating BIPAP today  He does not want to be on life support.   2-Hyperlipidemia: Continue with atorvastatin.  3-Hypertension;  Continue with losartan and Metoprolol.   S/P Aortic Valve replacement.   Anxiety/ Continue with Sertraline.   GERD; Continue with PPI.  Diabetes: SSI. Opioid use disorder in remission: Continue with Suboxone. History of Hodgkin lymphoma  Weight loss: HIV non reactive.  ,  TSH: 3.6.  Will need further evaluation by his PCP ? Pulmonary cachexia.   Estimated body mass index is 22.78 kg/m as calculated from the following:   Height as of this encounter: '5\' 11"'$  (1.803 m).   Weight as of this encounter: 74.1 kg.   DVT prophylaxis: Lovenox Code Status: Full code Family Communication: Care discussed with patient. Discussed with Wife at bedside.  Disposition Plan:  Status is: Inpatient Remains inpatient appropriate because: admitted with respiratory failure    Consultants:  Cardiology Pulmonology   Procedures:  ECHO  Antimicrobials:    Subjective: He was placed on BIPAP. Started on precedex. Feels better on BIPAP?  He does not wishes to be intubated.   Objective: Vitals:   06/16/22 0419 06/16/22 0651 06/16/22 0800 06/16/22 0842  BP:   (!) 174/65   Pulse:   90   Resp:   (!) 26   Temp:  97.8 F (36.6 C)  TempSrc:    Oral  SpO2: 94%  (!) 89%   Weight:  74.1 kg    Height:        Intake/Output Summary (Last 24 hours) at 06/16/2022 0910 Last data filed at 06/16/2022 0900 Gross per 24 hour   Intake 353.32 ml  Output 2350 ml  Net -1996.68 ml    Filed Weights   06/08/2022 1939 06/14/22 1045 06/16/22 0651  Weight: 80.3 kg 78 kg 74.1 kg    Examination:  General exam: NAD Respiratory system: BL wheezing, on BIPAP.  Cardiovascular system: S 1, S 2 RRR Gastrointestinal system: BS present, soft, nt Central nervous system: Alert Extremities: no edema   Data Reviewed: I have personally reviewed following labs and imaging studies  CBC: Recent Labs  Lab 05/28/2022 2040 06/14/22 0530 06/15/22 0303 06/16/22 0251  WBC 14.1* 15.9* 17.0* 14.3*  NEUTROABS 11.1*  --  13.5*  --   HGB 14.1 14.0 15.0 16.2  HCT 44.3 44.0 47.2 50.1  MCV 90.6 90.9 90.8 90.1  PLT 221 218 223 154    Basic Metabolic Panel: Recent Labs  Lab 05/24/2022 0050 06/16/2022 2040 06/14/22 0530 06/15/22 0303 06/16/22 0251  NA  --  133* 138 136 138  K  --  3.5 3.8 4.0 3.5  CL  --  96* 101 94* 92*  CO2  --  '28 29 30 '$ 34*  GLUCOSE  --  135* 129* 126* 138*  BUN  --  '9 10 14 '$ 24*  CREATININE  --  0.58* 0.53* 0.57* 0.79  CALCIUM  --  8.6* 9.2 8.8* 9.5  MG 2.0  --   --  1.9  --   PHOS  --   --   --  3.3  --     GFR: Estimated Creatinine Clearance: 109.3 mL/min (by C-G formula based on SCr of 0.79 mg/dL). Liver Function Tests: Recent Labs  Lab 06/14/22 0530 06/15/22 0303  AST 26 29  ALT 13 13  ALKPHOS 107 98  BILITOT 0.9 1.1  PROT 7.5 7.5  ALBUMIN 3.4* 3.2*    No results for input(s): "LIPASE", "AMYLASE" in the last 168 hours. No results for input(s): "AMMONIA" in the last 168 hours. Coagulation Profile: Recent Labs  Lab 05/31/2022 2040  INR 1.1    Cardiac Enzymes: No results for input(s): "CKTOTAL", "CKMB", "CKMBINDEX", "TROPONINI" in the last 168 hours. BNP (last 3 results) No results for input(s): "PROBNP" in the last 8760 hours. HbA1C: No results for input(s): "HGBA1C" in the last 72 hours. CBG: Recent Labs  Lab 06/15/22 0724 06/15/22 1142 06/15/22 1640 06/15/22 2121  06/16/22 0741  GLUCAP 133* 140* 200* 120* 171*    Lipid Profile: Recent Labs    06/16/22 0251  CHOL 90  HDL 28*  LDLCALC 49  TRIG 66  CHOLHDL 3.2   Thyroid Function Tests: Recent Labs    06/14/22 0830  TSH 3.674    Anemia Panel: No results for input(s): "VITAMINB12", "FOLATE", "FERRITIN", "TIBC", "IRON", "RETICCTPCT" in the last 72 hours. Sepsis Labs: Recent Labs  Lab 05/26/2022 2040 06/14/22 1236 06/15/22 0303 06/16/22 0251  PROCALCITON  --  <0.10 0.15 0.20  LATICACIDVEN 1.1  --   --   --      Recent Results (from the past 240 hour(s))  Culture, blood (routine x 2)     Status: None (Preliminary result)   Collection Time: 06/08/2022  8:30 PM   Specimen: BLOOD  Result Value Ref Range Status  Specimen Description   Final    BLOOD RIGHT ANTECUBITAL Performed at [facility] [address] [place], [place] [address]    Special Requests   Final    BOTTLES DRAWN AEROBIC AND ANAEROBIC Blood Culture adequate volume Performed at [facility] [address] [place], [place] [address]    Culture   Final    NO GROWTH 3 DAYS Performed at [facility], [place] [address] [place], [place] [address]    Report Status PENDING  Incomplete  Culture, blood (routine x 2)     Status: None (Preliminary result)   Collection Time: 05/18/2022  8:40 PM   Specimen: BLOOD  Result Value Ref Range Status   Specimen Description   Final    BLOOD BLOOD RIGHT ARM Performed at [facility] [address], [place], [place] [address]    Special Requests   Final    BOTTLES DRAWN AEROBIC AND ANAEROBIC Blood Culture adequate volume Performed at [facility] [address], [place], [place] [address]    Culture   Final    NO GROWTH 3 DAYS Performed at [facility], [place] [address] [place], [place] [address]    Report Status PENDING  Incomplete  SARS Coronavirus 2 by RT PCR (hospital order, performed in  [facility] hospital lab) *cepheid single result test* Anterior Nasal Swab     Status: None   Collection Time: 05/19/2022  8:40 PM   Specimen: Anterior Nasal Swab  Result Value Ref Range Status   SARS Coronavirus 2 by RT PCR NEGATIVE NEGATIVE Final    Comment: (NOTE) SARS-CoV-2 target nucleic acids are NOT DETECTED.  The SARS-CoV-2 RNA is generally detectable in upper and lower respiratory specimens during the acute phase of infection. The lowest concentration of SARS-CoV-2 viral copies this assay can detect is 250 copies / mL. A negative result does not preclude SARS-CoV-2 infection and should not be used as the sole basis for treatment or other patient management decisions.  A negative result may occur with improper specimen collection / handling, submission of specimen other than nasopharyngeal swab, presence of viral mutation(s) within the areas targeted by this assay, and inadequate number of viral copies (<250 copies / mL). A negative result must be combined with clinical observations, patient history, and epidemiological information.  Fact Sheet for Patients:   https://www.patel.info/  Fact Sheet for Healthcare Providers: https://hall.com/  This test is not yet approved or  cleared by the [place] FDA and has been authorized for detection and/or diagnosis of SARS-CoV-2 by FDA under an Emergency Use Authorization (EUA).  This EUA will remain in effect (meaning this test can be used) for the duration of the COVID-19 declaration under Section 564(b)(1) of the Act, 21 U.S.C. section 360bbb-3(b)(1), unless the authorization is terminated or revoked sooner.  Performed at [facility], [place] [address], [place], [place] [address]   Respiratory (~20 pathogens) panel by PCR     Status: Abnormal   Collection Time: 06/14/22  8:49 AM   Specimen: Nasopharyngeal Swab; Respiratory  Result Value Ref Range Status    Adenovirus DETECTED (A) NOT DETECTED Final   Coronavirus 229E NOT DETECTED NOT DETECTED Final    Comment: (NOTE) The Coronavirus on the Respiratory Panel, DOES NOT test for the novel  Coronavirus (2019 nCoV)    Coronavirus HKU1 NOT DETECTED NOT DETECTED Final   Coronavirus NL63 NOT DETECTED NOT DETECTED Final   Coronavirus OC43  NOT DETECTED NOT DETECTED Final   Metapneumovirus NOT DETECTED NOT DETECTED Final   Rhinovirus / Enterovirus NOT DETECTED NOT DETECTED Final   Influenza A NOT DETECTED NOT DETECTED Final   Influenza B NOT DETECTED NOT DETECTED Final   Parainfluenza Virus 1 NOT DETECTED NOT DETECTED Final   Parainfluenza Virus 2 NOT DETECTED NOT DETECTED Final   Parainfluenza Virus 3 NOT DETECTED NOT DETECTED Final   Parainfluenza Virus 4 NOT DETECTED NOT DETECTED Final   Respiratory Syncytial Virus NOT DETECTED NOT DETECTED Final   Bordetella pertussis NOT DETECTED NOT DETECTED Final   Bordetella Parapertussis NOT DETECTED NOT DETECTED Final   Chlamydophila pneumoniae NOT DETECTED NOT DETECTED Final   Mycoplasma pneumoniae NOT DETECTED NOT DETECTED Final    Comment: Performed at [facility], [place] [address], [place], [place] [address]  MRSA Next Gen by PCR, Nasal     Status: None   Collection Time: 06/14/22 10:44 AM   Specimen: Nasal Mucosa; Nasal Swab  Result Value Ref Range Status   MRSA by PCR Next Gen NOT DETECTED NOT DETECTED Final    Comment: (NOTE) The GeneXpert MRSA Assay (FDA approved for NASAL specimens only), is one component of a comprehensive MRSA colonization surveillance program. It is not intended to diagnose MRSA infection nor to guide or monitor treatment for MRSA infections. Test performance is not FDA approved in patients less than 62 years old. Performed at [facility], [place] [address] [place], [place] [address]          Radiology Studies: ECHOCARDIOGRAM COMPLETE  Result Date: 06/15/2022    ECHOCARDIOGRAM REPORT    Patient Name:   MARSHA HILLMAN Date of Exam: 06/14/2022 Medical Rec #:  [id number]         Height:       71.0 in Accession #:    [id number]        Weight:       172.0 lb Date of Birth:  12-14-1965         BSA:          1.977 m Patient Age:    53 years          BP:           125/73 mmHg Patient Gender: M                 HR:           73 bpm. Exam Location:  Inpatient Procedure: 2D Echo, Cardiac Doppler and Color Doppler Indications:     CHF-Acute Systolic E08.14  History:         Patient has prior history of Echocardiogram examinations, most                  recent 09/05/2019. COPD, Arrythmias:LBBB,                  Signs/Symptoms:Murmur; Risk Factors:Dyslipidemia and                  Hypertension.                  Aortic Valve: 21 mm bioprosthetic valve is present in the                  aortic position. Procedure Date: 07/29/2019.  Sonographer:     Darlina Sicilian RDCS Referring Phys:  [id number] [place] Diagnosing Phys: Vernell Leep MD  Sonographer Comments: Imaging limited due to BiPap. IMPRESSIONS  1. Left ventricular ejection fraction, by estimation, is 50 to 55%. The left ventricle has low normal function. The left ventricle demonstrates regional wall motion abnormalities (see scoring diagram/findings for description). Left ventricular diastolic  parameters are indeterminate.  2. Right ventricular systolic function is normal. The right ventricular size is normal.  3. The mitral valve is abnormal. Mild mitral valve regurgitation. Mild mitral stenosis. The mean mitral valve gradient is 5.0 mmHg. Moderate to severe mitral annular calcification.  4. The aortic valve has been repaired/replaced. Aortic valve regurgitation is not visualized. There is a 21 mm bioprosthetic valve present in the aortic position. Procedure Date: 07/29/2019. Echo findings are consistent with normal structure and function of the aortic valve prosthesis.  5. The inferior vena cava is dilated in size with <50% respiratory  variability, suggesting right atrial pressure of 15 mmHg. Comparison(s): A prior study was performed on 09/03/2019. LVEF has improved from 40-45%. Tricuspid regurgitation not well appreciated on this study. FINDINGS  Left Ventricle: Left ventricular ejection fraction, by estimation, is 50 to 55%. The left ventricle has low normal function. The left ventricle demonstrates regional wall motion abnormalities. The left ventricular internal cavity size was normal in size. There is no left ventricular hypertrophy. Abnormal (paradoxical) septal motion consistent with post-operative status. Left ventricular diastolic parameters are indeterminate. Right Ventricle: The right ventricular size is normal. No increase in right ventricular wall thickness. Right ventricular systolic function is normal. Left Atrium: Left atrial size was normal in size. Right Atrium: Right atrial size was normal in size. Pericardium: There is no evidence of pericardial effusion. Mitral Valve: The mitral valve is abnormal. Moderate to severe mitral annular calcification. Mild mitral valve regurgitation. Mild mitral valve stenosis. MV peak gradient, 9.4 mmHg. The mean mitral valve gradient is 5.0 mmHg. Tricuspid Valve: The tricuspid valve is normal in structure. Tricuspid valve regurgitation is not demonstrated. No evidence of tricuspid stenosis. Aortic Valve: Mean PG 13-15 mmHg is acceptable. The aortic valve has been repaired/replaced. Aortic valve regurgitation is not visualized. Aortic valve mean gradient measures 12.3 mmHg. Aortic valve peak gradient measures 23.4 mmHg. Aortic valve area, by  VTI measures 1.82 cm. There is a 21 mm bioprosthetic valve present in the aortic position. Procedure Date: 07/29/2019. Echo findings are consistent with normal structure and function of the aortic valve prosthesis. Pulmonic Valve: The pulmonic valve was grossly normal. Pulmonic valve regurgitation is trivial. Aorta: The aortic root is normal in size and  structure. Venous: The inferior vena cava is dilated in size with less than 50% respiratory variability, suggesting right atrial pressure of 15 mmHg. IAS/Shunts: The interatrial septum was not assessed.  LEFT VENTRICLE PLAX 2D LVIDd:         5.60 cm LVIDs:         4.00 cm LV PW:         0.90 cm LV IVS:        0.90 cm LVOT diam:     1.90 cm LV SV:         74 LV SV Index:   38 LVOT Area:     2.84 cm  LV Volumes (MOD) LV vol d, MOD A2C: 96.4 ml LV vol d, MOD A4C: 128.0 ml LV vol s, MOD A2C: 58.2 ml LV vol s, MOD A4C: 54.4 ml LV SV MOD A2C:     38.2 ml LV SV MOD A4C:     128.0 ml LV SV MOD BP:      56.9 ml RIGHT VENTRICLE TAPSE (  M-mode): 1.9 cm LEFT ATRIUM             Index LA diam:        4.40 cm 2.23 cm/m LA Vol (A2C):   53.2 ml 26.88 ml/m LA Vol (A4C):   46.9 ml 23.72 ml/m LA Biplane Vol: 44.8 ml 22.66 ml/m  AORTIC VALVE AV Area (Vmax):    1.67 cm AV Area (Vmean):   1.76 cm AV Area (VTI):     1.82 cm AV Vmax:           241.67 cm/s AV Vmean:          158.333 cm/s AV VTI:            0.409 m AV Peak Grad:      23.4 mmHg AV Mean Grad:      12.3 mmHg LVOT Vmax:         142.00 cm/s LVOT Vmean:        98.300 cm/s LVOT VTI:          0.262 m LVOT/AV VTI ratio: 0.64  AORTA Ao Asc diam: 3.00 cm MITRAL VALVE MV Area (PHT): 2.34 cm     SHUNTS MV Area VTI:   1.71 cm     Systemic VTI:  0.26 m MV Peak grad:  9.4 mmHg     Systemic Diam: 1.90 cm MV Mean grad:  5.0 mmHg MV Vmax:       1.54 m/s MV Vmean:      103.2 cm/s MV Decel Time: 324 msec MV E velocity: 144.00 cm/s MV A velocity: 110.00 cm/s MV E/A ratio:  1.31 Manish Patwardhan MD Electronically signed by Vernell Leep MD Signature Date/Time: 06/14/2022/2:00:25 PM    Final (Updated)    DG CHEST PORT 1 VIEW  Result Date: 06/15/2022 CLINICAL DATA:  Skin fold.  Assess for pneumothorax EXAM: PORTABLE CHEST 1 VIEW COMPARISON:  Same-day radiograph at 0900 hours FINDINGS: Loop recorder overlies the left chest. Stable heart size. Bilateral airspace opacities. Aeration  within the right upper lobe has slightly improved compared to the recent prior exam. Previously seen skin fold is no longer evident. No evidence of pneumothorax. IMPRESSION: 1. No evidence of pneumothorax. 2. Persistent bilateral airspace opacities, slightly improved in the right upper lobe compared to the recent prior exam. Electronically Signed   By: Davina Poke D.O.   On: 06/15/2022 11:14   DG CHEST PORT 1 VIEW  Result Date: 06/15/2022 CLINICAL DATA:  Respiratory failure EXAM: PORTABLE CHEST 1 VIEW COMPARISON:  05/22/2022 FINDINGS: Loop recorder overlies the left chest. Stable heart size. Significant interval worsening of bilateral airspace opacities, most confluent within the right upper lobe. Vertically oriented linear marking at the periphery of the left hemithorax which appears to have lung markings extending peripherally, favored to represent a skin fold. No right-sided pneumothorax. Probable small bilateral pleural effusions. IMPRESSION: 1. Significant interval worsening of bilateral airspace opacities, most confluent within the right upper lobe. 2. Probable skin fold overlying the periphery of the left chest. Recommend repeat upright chest radiograph to exclude the possibility of a small left pneumothorax. These results will be called to the ordering clinician or representative by the Radiologist Assistant, and communication documented in the PACS or [employer]. Electronically Signed   By: Davina Poke D.O.   On: 06/15/2022 10:01        Scheduled Meds:  albuterol  2.5 mg Nebulization TID   arformoterol  15 mcg Nebulization BID   aspirin EC  81 mg  Oral Daily   atorvastatin  20 mg Oral Daily   budesonide (PULMICORT) nebulizer solution  0.5 mg Nebulization BID   buprenorphine-naloxone  1 tablet Sublingual TID   Chlorhexidine Gluconate Cloth  6 each Topical Daily   dapagliflozin propanediol  10 mg Oral Daily   enoxaparin (LOVENOX) injection  40 mg Subcutaneous Q24H    furosemide  40 mg Intravenous TID   insulin aspart  0-9 Units Subcutaneous TID WC   levothyroxine  175 mcg Oral Q0600   losartan  25 mg Oral Daily   methylPREDNISolone (SOLU-MEDROL) injection  40 mg Intravenous BID   metoprolol tartrate  50 mg Oral TID   pantoprazole  40 mg Oral BID   potassium chloride  40 mEq Oral Once   revefenacin  175 mcg Nebulization Daily   sertraline  200 mg Oral QHS   sodium chloride flush  3 mL Intravenous Q12H   Continuous Infusions:  azithromycin Stopped (06/15/22 2139)   cefTRIAXone (ROCEPHIN)  IV Stopped (06/15/22 2102)   dexmedetomidine (PRECEDEX) IV infusion 0.4 mcg/kg/hr (06/16/22 0828)     LOS: 3 days    Time spent: 35 minutes.     Elmarie Shiley, MD [facility]   If 7PM-7AM, please contact night-coverage [email or web address]  06/16/2022, 9:10 AM

## 2022-06-16 NOTE — IPAL (Signed)
  Interdisciplinary Goals of Care Family Meeting   Date carried out:: 06/16/2022  Location of the meeting: Bedside  Member's involved: Physician and Bedside Registered Nurse  Durable Power of Attorney or acting medical decision maker: Patient    Discussion: We discussed goals of care for Henry Sheppard .   Taaj is alert and able to make his own decisions.  We discussed his tenuous respiratory status and intolerance of BiPAP.  He has agreed to retry the BiPAP with the use of Precedex.  He has made it very clear to [place] that he does not want to be intubated or resuscitated in the case of cardiac arrest.  DNR order entered.  Code status: Full DNR  Disposition: Continue current acute care   Time spent for the meeting: 10 mins  Marshell Garfinkel MD [facility] 06/16/2022, 8:47 AM

## 2022-06-16 NOTE — Progress Notes (Signed)
RT found patient with bipap half way off drinking water. RT explained to patient that he could aspirate. RT placed patient back on [place] so he could drink some more water.

## 2022-06-16 NOTE — Progress Notes (Signed)
Progress Note  Patient Name: Henry Sheppard Date of Encounter: 06/16/2022  Attending physician: Elmarie Shiley, MD Primary care provider: Lilian Coma., MD Primary Cardiologist: Dr. Virgina Jock   Subjective: Henry Sheppard is a 56 y.o. male who was seen and examined at bedside  Denies chest pain.  In respiratory distress - placed on BiPAP and Precedex  Refuses to be intubated.  CCM has evaluated him this morning - note reviewed.  No significant net negative UOP over 24hr per I/O.  BNP uptrending  Case discussed and reviewed with his nurse.  Objective: Vital Signs in the last 24 hours: Temp:  [97 F (36.1 C)-98.1 F (36.7 C)] 97.8 F (36.6 C) (07/30 0842) Pulse Rate:  [56-99] 81 (07/30 1100) Resp:  [17-27] 18 (07/30 1100) BP: (98-174)/(54-89) 111/77 (07/30 1100) SpO2:  [82 %-95 %] 92 % (07/30 1100) FiO2 (%):  [70 %] 70 % (07/30 0759) Weight:  [74.1 kg] 74.1 kg (07/30 0651)  Intake/Output:  Intake/Output Summary (Last 24 hours) at 06/16/2022 1138 Last data filed at 06/16/2022 0900 Gross per 24 hour  Intake 353.32 ml  Output 550 ml  Net -196.68 ml    Net IO Since Admission: -4,675.91 mL [06/16/22 1138]  Weights:  Filed Weights   06/04/2022 1939 06/14/22 1045 06/16/22 0651  Weight: 80.3 kg 78 kg 74.1 kg    Telemetry: Personally reviewed. SR w/ ectopy.   Physical examination: PHYSICAL EXAM:    06/16/2022   11:00 AM 06/16/2022   10:00 AM 06/16/2022    9:00 AM  Vitals with BMI  Systolic 161 096 045  Diastolic 77 67 89  Pulse 81 90 99    CONSTITUTIONAL: Appears older than stated age, respiratory distress on BiPAP, use of accessory muscles.  SKIN: Skin is warm and dry. No rash noted.  HEAD: Normocephalic and atraumatic.  EYES: No scleral icterus.  No xanthelasmas MOUTH/THROAT: BiPAP mask present.  NECK: JVD present. No thyromegaly noted. No carotid bruits  CHEST Increased respiratory effort. No intercostal retractions,use of accessory muscles.  LUNGS:  Decreased breath sounds bilaterally, less expiratory wheezing, rales noted bilaterally. CARDIOVASCULAR: Regular, positive S1-S2, no murmurs rubs or gallops appreciated secondary to tachycardia, sternotomy site is well-healed.  ABDOMINAL: Soft, nontender, nondistended, positive bowel sounds in all 4 quadrants, no apparent ascites.  EXTREMITIES: No pitting edema, warm to touch, 2+ bilateral posterior tibial pulses. HEMATOLOGIC: No significant bruising NEUROLOGIC: Oriented to person, place, and time. Nonfocal. Normal muscle tone.  PSYCHIATRIC: Normal mood and affect. Normal behavior. Cooperative  Lab Results: Hematology Recent Labs  Lab 06/14/22 0530 06/15/22 0303 06/16/22 0251  WBC 15.9* 17.0* 14.3*  RBC 4.84 5.20 5.56  HGB 14.0 15.0 16.2  HCT 44.0 47.2 50.1  MCV 90.9 90.8 90.1  MCH 28.9 28.8 29.1  MCHC 31.8 31.8 32.3  RDW 18.0* 18.7* 18.5*  PLT 218 223 270    Chemistry Recent Labs  Lab 06/14/22 0530 06/15/22 0303 06/16/22 0251  NA 138 136 138  K 3.8 4.0 3.5  CL 101 94* 92*  CO2 29 30 34*  GLUCOSE 129* 126* 138*  BUN 10 14 24*  CREATININE 0.53* 0.57* 0.79  CALCIUM 9.2 8.8* 9.5  PROT 7.5 7.5  --   ALBUMIN 3.4* 3.2*  --   AST 26 29  --   ALT 13 13  --   ALKPHOS 107 98  --   BILITOT 0.9 1.1  --   GFRNONAA >60 >60 >60  ANIONGAP 8 12 12  Cardiac Enzymes: Cardiac Panel (last 3 results) Recent Labs    05/31/2022 2040  TROPONINIHS 6    BNP (last 3 results) Recent Labs    05/29/2022 2040 06/16/22 0251  BNP 436.7* 823.9*    ProBNP (last 3 results) No results for input(s): "PROBNP" in the last 8760 hours.   DDimer No results for input(s): "DDIMER" in the last 168 hours.   Hemoglobin A1c:  Lab Results  Component Value Date   HGBA1C 6.3 (H) 07/27/2019   MPG 134 07/27/2019    TSH  Recent Labs    06/14/22 0830  TSH 3.674    Lipid Panel     Component Value Date/Time   CHOL 90 06/16/2022 0251   TRIG 66 06/16/2022 0251   HDL 28 (L) 06/16/2022 0251    CHOLHDL 3.2 06/16/2022 0251   VLDL 13 06/16/2022 0251   LDLCALC 49 06/16/2022 0251    Imaging: ECHOCARDIOGRAM COMPLETE  Result Date: 06/15/2022    ECHOCARDIOGRAM REPORT   Patient Name:   Henry Sheppard Date of Exam: 06/14/2022 Medical Rec #:  [id number]         Height:       71.0 in Accession #:    [id number]        Weight:       172.0 lb Date of Birth:  01/28/1966         BSA:          1.977 m Patient Age:    26 years          BP:           125/73 mmHg Patient Gender: M                 HR:           73 bpm. Exam Location:  Inpatient Procedure: 2D Echo, Cardiac Doppler and Color Doppler Indications:     CHF-Acute Systolic Z32.99  History:         Patient has prior history of Echocardiogram examinations, most                  recent 09/05/2019. COPD, Arrythmias:LBBB,                  Signs/Symptoms:Murmur; Risk Factors:Dyslipidemia and                  Hypertension.                  Aortic Valve: 21 mm bioprosthetic valve is present in the                  aortic position. Procedure Date: 07/29/2019.  Sonographer:     Darlina Sicilian RDCS Referring Phys:  [id number] [place] Diagnosing Phys: Vernell Leep MD  Sonographer Comments: Imaging limited due to BiPap. IMPRESSIONS  1. Left ventricular ejection fraction, by estimation, is 50 to 55%. The left ventricle has low normal function. The left ventricle demonstrates regional wall motion abnormalities (see scoring diagram/findings for description). Left ventricular diastolic  parameters are indeterminate.  2. Right ventricular systolic function is normal. The right ventricular size is normal.  3. The mitral valve is abnormal. Mild mitral valve regurgitation. Mild mitral stenosis. The mean mitral valve gradient is 5.0 mmHg. Moderate to severe mitral annular calcification.  4. The aortic valve has been repaired/replaced. Aortic valve regurgitation is not visualized. There is a 21 mm bioprosthetic valve present in the aortic position. Procedure Date:  07/29/2019.  Echo findings are consistent with normal structure and function of the aortic valve prosthesis.  5. The inferior vena cava is dilated in size with <50% respiratory variability, suggesting right atrial pressure of 15 mmHg. Comparison(s): A prior study was performed on 09/03/2019. LVEF has improved from 40-45%. Tricuspid regurgitation not well appreciated on this study. FINDINGS  Left Ventricle: Left ventricular ejection fraction, by estimation, is 50 to 55%. The left ventricle has low normal function. The left ventricle demonstrates regional wall motion abnormalities. The left ventricular internal cavity size was normal in size. There is no left ventricular hypertrophy. Abnormal (paradoxical) septal motion consistent with post-operative status. Left ventricular diastolic parameters are indeterminate. Right Ventricle: The right ventricular size is normal. No increase in right ventricular wall thickness. Right ventricular systolic function is normal. Left Atrium: Left atrial size was normal in size. Right Atrium: Right atrial size was normal in size. Pericardium: There is no evidence of pericardial effusion. Mitral Valve: The mitral valve is abnormal. Moderate to severe mitral annular calcification. Mild mitral valve regurgitation. Mild mitral valve stenosis. MV peak gradient, 9.4 mmHg. The mean mitral valve gradient is 5.0 mmHg. Tricuspid Valve: The tricuspid valve is normal in structure. Tricuspid valve regurgitation is not demonstrated. No evidence of tricuspid stenosis. Aortic Valve: Mean PG 13-15 mmHg is acceptable. The aortic valve has been repaired/replaced. Aortic valve regurgitation is not visualized. Aortic valve mean gradient measures 12.3 mmHg. Aortic valve peak gradient measures 23.4 mmHg. Aortic valve area, by  VTI measures 1.82 cm. There is a 21 mm bioprosthetic valve present in the aortic position. Procedure Date: 07/29/2019. Echo findings are consistent with normal structure and function  of the aortic valve prosthesis. Pulmonic Valve: The pulmonic valve was grossly normal. Pulmonic valve regurgitation is trivial. Aorta: The aortic root is normal in size and structure. Venous: The inferior vena cava is dilated in size with less than 50% respiratory variability, suggesting right atrial pressure of 15 mmHg. IAS/Shunts: The interatrial septum was not assessed.  LEFT VENTRICLE PLAX 2D LVIDd:         5.60 cm LVIDs:         4.00 cm LV PW:         0.90 cm LV IVS:        0.90 cm LVOT diam:     1.90 cm LV SV:         74 LV SV Index:   38 LVOT Area:     2.84 cm  LV Volumes (MOD) LV vol d, MOD A2C: 96.4 ml LV vol d, MOD A4C: 128.0 ml LV vol s, MOD A2C: 58.2 ml LV vol s, MOD A4C: 54.4 ml LV SV MOD A2C:     38.2 ml LV SV MOD A4C:     128.0 ml LV SV MOD BP:      56.9 ml RIGHT VENTRICLE TAPSE (M-mode): 1.9 cm LEFT ATRIUM             Index LA diam:        4.40 cm 2.23 cm/m LA Vol (A2C):   53.2 ml 26.88 ml/m LA Vol (A4C):   46.9 ml 23.72 ml/m LA Biplane Vol: 44.8 ml 22.66 ml/m  AORTIC VALVE AV Area (Vmax):    1.67 cm AV Area (Vmean):   1.76 cm AV Area (VTI):     1.82 cm AV Vmax:           241.67 cm/s AV Vmean:          158.333 cm/s  AV VTI:            0.409 m AV Peak Grad:      23.4 mmHg AV Mean Grad:      12.3 mmHg LVOT Vmax:         142.00 cm/s LVOT Vmean:        98.300 cm/s LVOT VTI:          0.262 m LVOT/AV VTI ratio: 0.64  AORTA Ao Asc diam: 3.00 cm MITRAL VALVE MV Area (PHT): 2.34 cm     SHUNTS MV Area VTI:   1.71 cm     Systemic VTI:  0.26 m MV Peak grad:  9.4 mmHg     Systemic Diam: 1.90 cm MV Mean grad:  5.0 mmHg MV Vmax:       1.54 m/s MV Vmean:      103.2 cm/s MV Decel Time: 324 msec MV E velocity: 144.00 cm/s MV A velocity: 110.00 cm/s MV E/A ratio:  1.31 Manish Patwardhan MD Electronically signed by Vernell Leep MD Signature Date/Time: 06/14/2022/2:00:25 PM    Final (Updated)    DG CHEST PORT 1 VIEW  Result Date: 06/15/2022 CLINICAL DATA:  Skin fold.  Assess for pneumothorax EXAM:  PORTABLE CHEST 1 VIEW COMPARISON:  Same-day radiograph at 0900 hours FINDINGS: Loop recorder overlies the left chest. Stable heart size. Bilateral airspace opacities. Aeration within the right upper lobe has slightly improved compared to the recent prior exam. Previously seen skin fold is no longer evident. No evidence of pneumothorax. IMPRESSION: 1. No evidence of pneumothorax. 2. Persistent bilateral airspace opacities, slightly improved in the right upper lobe compared to the recent prior exam. Electronically Signed   By: Davina Poke D.O.   On: 06/15/2022 11:14   DG CHEST PORT 1 VIEW  Result Date: 06/15/2022 CLINICAL DATA:  Respiratory failure EXAM: PORTABLE CHEST 1 VIEW COMPARISON:  06/01/2022 FINDINGS: Loop recorder overlies the left chest. Stable heart size. Significant interval worsening of bilateral airspace opacities, most confluent within the right upper lobe. Vertically oriented linear marking at the periphery of the left hemithorax which appears to have lung markings extending peripherally, favored to represent a skin fold. No right-sided pneumothorax. Probable small bilateral pleural effusions. IMPRESSION: 1. Significant interval worsening of bilateral airspace opacities, most confluent within the right upper lobe. 2. Probable skin fold overlying the periphery of the left chest. Recommend repeat upright chest radiograph to exclude the possibility of a small left pneumothorax. These results will be called to the ordering clinician or representative by the Radiologist Assistant, and communication documented in the PACS or [employer]. Electronically Signed   By: Davina Poke D.O.   On: 06/15/2022 10:01    CARDIAC DATABASE: CARDIAC DATABASE: EKG: 06/08/2022: Sinus rhythm, 87 bpm, left bundle branch block, PACs, ST-T changes likely secondary to LBBB but ischemia cannot be entirely ruled out.   Echocardiogram: 06/14/2022  1. Left ventricular ejection fraction, by estimation, is 50  to 55%. The  left ventricle has low normal function. The left ventricle demonstrates  regional wall motion abnormalities (see scoring diagram/findings for  description). Left ventricular diastolic   parameters are indeterminate.   2. Right ventricular systolic function is normal. The right ventricular  size is normal.   3. The mitral valve is abnormal. Mild mitral valve regurgitation. Mild  mitral stenosis. The mean mitral valve gradient is 5.0 mmHg. Moderate to  severe mitral annular calcification.   4. The aortic valve has been repaired/replaced. Aortic valve  regurgitation is not  visualized. There is a 21 mm bioprosthetic valve  present in the aortic position. Procedure Date: 07/29/2019. Echo findings  are consistent with normal structure and  function of the aortic valve prosthesis.   5. The inferior vena cava is dilated in size with <50% respiratory  variability, suggesting right atrial pressure of 15 mmHg.  Comparison(s): A prior study was performed on 09/03/2019. LVEF has  improved from 40-45%. Tricuspid regurgitation not well appreciated on this  study.    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  Scheduled Meds:  albuterol  2.5 mg Nebulization TID   arformoterol  15 mcg Nebulization BID   aspirin EC  81 mg Oral Daily   atorvastatin  20 mg Oral Daily   budesonide (PULMICORT) nebulizer solution  0.5 mg Nebulization BID   buprenorphine-naloxone  1 tablet Sublingual TID   Chlorhexidine Gluconate Cloth  6 each Topical Daily   dapagliflozin propanediol  10  mg Oral Daily   enoxaparin (LOVENOX) injection  40 mg Subcutaneous Q24H   furosemide  80 mg Intravenous Q12H   insulin aspart  0-9 Units Subcutaneous TID WC   levothyroxine  175 mcg Oral Q0600   losartan  25 mg Oral Daily   methylPREDNISolone (SOLU-MEDROL) injection  40 mg Intravenous BID   metolazone  2.5 mg Oral Daily   metoprolol tartrate  50 mg Oral TID   pantoprazole  40 mg Oral BID   potassium chloride  40 mEq Oral Once   revefenacin  175 mcg Nebulization Daily   sertraline  200 mg Oral QHS   sodium chloride flush  3 mL Intravenous Q12H    Continuous Infusions:  azithromycin Stopped (06/15/22 2139)   cefTRIAXone (ROCEPHIN)  IV Stopped (06/15/22 2102)   dexmedetomidine (PRECEDEX) IV infusion 0.4 mcg/kg/hr (06/16/22 0828)    PRN Meds: acetaminophen **OR** acetaminophen, albuterol, aspirin-acetaminophen-caffeine, butalbital-acetaminophen-caffeine, midazolam, ondansetron (ZOFRAN) IV, mouth rinse, polyethylene glycol   IMPRESSION & RECOMMENDATIONS: Henry Sheppard is a 56 y.o. 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    Acute on chronic hypoxemic respiratory failure due to CHF exacerbation/community-acquired pneumonia in the setting of adenovirus infection/underlying COPD with chronic oxygen dependence    Status post redo sternotomy and aortic root replacement with 21 mm Medtronic freestyle aortic prosthesis   Left bundle branch  block   COPD/nicotine dependence (quit smoking 1 week ago)/nocturnal oxygen dependence   History of opioid use   Hypothyroidism   Hypertension.  Hyperlipidemia  Recommendations.  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 Likely precipitated by underlying COPD/oxygen dependence in the setting of community-acquired pneumonia & adenovirus infection.  Patient states that he has been compliant with his heart failure medications on outpatient basis.  But lost to follow as outpatient for approximately 2 years.   Net IO Since Admission: -4,675.91 mL [06/16/22 1138] No significant diuresis over the last 24hrs.   BNP uptrending.   Increased Lasix to 80 mg IV push bid.   Add zaroxolyn 2.'5mg'$  po qday.  Continue Farxiga, losartan and Lopressor. We will focus on diuresis for now and uptitrate his GDMT prior to discharge.  Telemetry reviewed.  Central telemetry and RN notified of ST changes on telemetry.  ECG performed at 8am and during rounds.  No STEMI and confirmed by interventional cardiologist on call as well.  Clinically no chest pain.  ST changes likely due to his underlying LBBB, LVH per voltage criteria.  Monitor clinically for anginal discomfort.  Currently his BP is soft on telemetry likely due to starting Precedex - if BP improves consider nitro gtt.   Please see CCM note but patient has voiced that he does not want intubation and has changed his code status to DNR.   Status post redo sternotomy and aortic root replacement with 21 mm Medtronic freestyle aortic prosthesis: Echocardiogram results. We will need antibiotic prophylaxis long-term for procedures.  Benign essential hypertension: See above.   We will defer management of his other active cardiovascular comorbid conditions to CCM and primary medicine.  Their progress notes reviewed.  Plan of care discussed with patient and nursing staff.  We will continue to follow.  Patient's questions and concerns  were addressed to his satisfaction. He voices understanding of the instructions provided during this encounter.   This note was created using a voice recognition software as a result there may be grammatical errors inadvertently enclosed that do not reflect the nature of this encounter. Every attempt is made to correct such errors.  CRITICAL CARE Performed by: Rex Kras   Total critical care time: 42 minutes   Critical care time was exclusive of separately billable procedures and treating other patients.   Critical care was necessary to treat or prevent imminent or life-threatening deterioration.   Critical care was time spent personally by me on the following activities: development of treatment plan for acute heart failure precipitated by his COPD/PNA/ Adenovirus infection with patient as well as nursing, discussions with consultants (interventional cardiology), evaluation of patient's response to treatment, examination of patient, ordering and performing treatments and interventions, ordering and review of laboratory studies, ordering and review of radiographic studies, pulse oximetry and re-evaluation of patient's condition.  [facility]  Pager: [phone] Office: [phone] 06/16/2022, 11:38 AM

## 2022-06-17 ENCOUNTER — Inpatient Hospital Stay (HOSPITAL_COMMUNITY): Payer: Medicare Other

## 2022-06-17 DIAGNOSIS — J9621 Acute and chronic respiratory failure with hypoxia: Secondary | ICD-10-CM | POA: Diagnosis not present

## 2022-06-17 LAB — CBC WITH DIFFERENTIAL/PLATELET
Abs Immature Granulocytes: 0.06 10*3/uL (ref 0.00–0.07)
Basophils Absolute: 0 10*3/uL (ref 0.0–0.1)
Basophils Relative: 0 %
Eosinophils Absolute: 0.1 10*3/uL (ref 0.0–0.5)
Eosinophils Relative: 0 %
HCT: 50.5 % (ref 39.0–52.0)
Hemoglobin: 15.5 g/dL (ref 13.0–17.0)
Immature Granulocytes: 1 %
Lymphocytes Relative: 6 %
Lymphs Abs: 0.8 10*3/uL (ref 0.7–4.0)
MCH: 28.8 pg (ref 26.0–34.0)
MCHC: 30.7 g/dL (ref 30.0–36.0)
MCV: 93.7 fL (ref 80.0–100.0)
Monocytes Absolute: 1.7 10*3/uL — ABNORMAL HIGH (ref 0.1–1.0)
Monocytes Relative: 13 %
Neutro Abs: 10.2 10*3/uL — ABNORMAL HIGH (ref 1.7–7.7)
Neutrophils Relative %: 80 %
Platelets: 286 10*3/uL (ref 150–400)
RBC: 5.39 MIL/uL (ref 4.22–5.81)
RDW: 18.4 % — ABNORMAL HIGH (ref 11.5–15.5)
WBC: 12.8 10*3/uL — ABNORMAL HIGH (ref 4.0–10.5)
nRBC: 0 % (ref 0.0–0.2)

## 2022-06-17 LAB — BASIC METABOLIC PANEL
Anion gap: 12 (ref 5–15)
BUN: 34 mg/dL — ABNORMAL HIGH (ref 6–20)
CO2: 34 mmol/L — ABNORMAL HIGH (ref 22–32)
Calcium: 9.1 mg/dL (ref 8.9–10.3)
Chloride: 92 mmol/L — ABNORMAL LOW (ref 98–111)
Creatinine, Ser: 0.7 mg/dL (ref 0.61–1.24)
GFR, Estimated: >60 mL/min (ref >60)
Glucose, Bld: 139 mg/dL — ABNORMAL HIGH (ref 70–99)
Potassium: 4.2 mmol/L (ref 3.5–5.1)
Sodium: 138 mmol/L (ref 135–145)

## 2022-06-17 LAB — GLUCOSE, CAPILLARY
Glucose-Capillary: 105 mg/dL — ABNORMAL HIGH (ref 70–99)
Glucose-Capillary: 143 mg/dL — ABNORMAL HIGH (ref 70–99)
Glucose-Capillary: 196 mg/dL — ABNORMAL HIGH (ref 70–99)

## 2022-06-17 LAB — MAGNESIUM: Magnesium: 2.2 mg/dL (ref 1.7–2.4)

## 2022-06-17 LAB — TROPONIN I (HIGH SENSITIVITY): Troponin I (High Sensitivity): 5 ng/L (ref <18)

## 2022-06-17 MED ORDER — ONDANSETRON 4 MG PO TBDP: 4.0000 mg | ORAL_TABLET | Freq: Four times a day (QID) | ORAL | Status: DC | PRN

## 2022-06-17 MED ORDER — FENTANYL CITRATE PF 50 MCG/ML IJ SOSY
50.0000 ug | PREFILLED_SYRINGE | INTRAMUSCULAR | Status: DC | PRN
  Administered 2022-06-17: 50 ug via INTRAVENOUS
  Filled 2022-06-17: qty 1

## 2022-06-17 MED ORDER — MORPHINE BOLUS VIA INFUSION
5.0000 mg | INTRAVENOUS | Status: DC | PRN
  Administered 2022-06-17 – 2022-06-18 (×11): 5 mg via INTRAVENOUS

## 2022-06-17 MED ORDER — ENOXAPARIN SODIUM 80 MG/0.8ML IJ SOSY
1.0000 mg/kg | PREFILLED_SYRINGE | Freq: Two times a day (BID) | INTRAMUSCULAR | Status: DC
  Filled 2022-06-17: qty 0.75

## 2022-06-17 MED ORDER — METHYLPREDNISOLONE SODIUM SUCC 40 MG IJ SOLR
40.0000 mg | Freq: Every day | INTRAMUSCULAR | Status: DC
  Administered 2022-06-17: 40 mg via INTRAVENOUS

## 2022-06-17 MED ORDER — FENTANYL CITRATE PF 50 MCG/ML IJ SOSY
50.0000 ug | PREFILLED_SYRINGE | INTRAMUSCULAR | Status: DC | PRN
  Administered 2022-06-17: 100 ug via INTRAVENOUS
  Filled 2022-06-17: qty 2

## 2022-06-17 MED ORDER — HALOPERIDOL LACTATE 5 MG/ML IJ SOLN: 2.5000 mg | INTRAMUSCULAR | Status: DC | PRN

## 2022-06-17 MED ORDER — MORPHINE 100MG IN NS 100ML (1MG/ML) PREMIX INFUSION
0.0000 mg/h | INTRAVENOUS | Status: DC
  Administered 2022-06-17: 5 mg/h via INTRAVENOUS
  Administered 2022-06-17: 15 mg/h via INTRAVENOUS
  Administered 2022-06-18: 20 mg/h via INTRAVENOUS
  Administered 2022-06-18: 25 mg/h via INTRAVENOUS
  Filled 2022-06-17 (×4): qty 100

## 2022-06-17 MED ORDER — ACETAMINOPHEN 650 MG RE SUPP: 650.0000 mg | Freq: Four times a day (QID) | RECTAL | Status: DC | PRN

## 2022-06-17 MED ORDER — GLYCOPYRROLATE 1 MG PO TABS: 1.0000 mg | ORAL_TABLET | ORAL | Status: DC | PRN

## 2022-06-17 MED ORDER — DOCUSATE SODIUM 50 MG/5ML PO LIQD
100.0000 mg | Freq: Two times a day (BID) | ORAL | Status: DC
Start: 2022-06-17 — End: 2022-06-18

## 2022-06-17 MED ORDER — ORAL CARE MOUTH RINSE: 15.0000 mL | OROMUCOSAL | Status: DC | PRN

## 2022-06-17 MED ORDER — METHYLPREDNISOLONE SODIUM SUCC 40 MG IJ SOLR
40.0000 mg | Freq: Two times a day (BID) | INTRAMUSCULAR | Status: DC
Start: 2022-06-17 — End: 2022-06-17
  Filled 2022-06-17: qty 1

## 2022-06-17 MED ORDER — DIPHENHYDRAMINE HCL 50 MG/ML IJ SOLN: 25.0000 mg | INTRAMUSCULAR | Status: DC | PRN

## 2022-06-17 MED ORDER — ONDANSETRON HCL 4 MG/2ML IJ SOLN: 4.0000 mg | Freq: Four times a day (QID) | INTRAMUSCULAR | Status: DC | PRN

## 2022-06-17 MED ORDER — ACETAMINOPHEN 325 MG PO TABS: 650.0000 mg | ORAL_TABLET | Freq: Four times a day (QID) | ORAL | Status: DC | PRN

## 2022-06-17 MED ORDER — POLYETHYLENE GLYCOL 3350 17 G PO PACK
17.0000 g | PACK | Freq: Every day | ORAL | Status: DC
Start: 2022-06-17 — End: 2022-06-18

## 2022-06-17 MED ORDER — ORAL CARE MOUTH RINSE: 15.0000 mL | OROMUCOSAL | Status: DC

## 2022-06-17 MED ORDER — GLYCOPYRROLATE 0.2 MG/ML IJ SOLN
0.2000 mg | INTRAMUSCULAR | Status: DC | PRN
  Filled 2022-06-17: qty 1

## 2022-06-17 MED ORDER — MORPHINE SULFATE (PF) 2 MG/ML IV SOLN
2.0000 mg | INTRAVENOUS | Status: AC | PRN
  Administered 2022-06-17 (×2): 2 mg via INTRAVENOUS
  Filled 2022-06-17 (×2): qty 1

## 2022-06-17 MED ORDER — POLYVINYL ALCOHOL 1.4 % OP SOLN: 1.0000 [drp] | Freq: Four times a day (QID) | OPHTHALMIC | Status: DC | PRN

## 2022-06-17 MED ORDER — LORAZEPAM 2 MG/ML IJ SOLN
2.0000 mg | INTRAMUSCULAR | Status: DC | PRN
  Administered 2022-06-17 – 2022-06-18 (×3): 2 mg via INTRAVENOUS
  Filled 2022-06-17 (×3): qty 1

## 2022-06-17 MED ORDER — SODIUM CHLORIDE 0.9 % IV SOLN
INTRAVENOUS | Status: DC
Start: 2022-06-17 — End: 2022-06-18

## 2022-06-17 MED ORDER — GLYCOPYRROLATE 0.2 MG/ML IJ SOLN: 0.2000 mg | INTRAMUSCULAR | Status: DC | PRN

## 2022-06-17 NOTE — Progress Notes (Signed)
  Interdisciplinary Goals of Care Family Meeting   Date carried out:: 06/17/2022  Location of the meeting: Bedside  Member's involved: Physician and Family Member or next of kin  Durable Power of Attorney or Loss adjuster, chartered: Pt's spouse    Discussion: We discussed goals of care for [employer]. We discussed continuing current measures including ABX, steroids, intermittent diuretic, trial of NIV, and continuous sedation as well as intermittently dosed opioid for air hunger hoping for recovery vs proceeding with comfort measures only. He repeatedly confirmed throughout day in company of his daughter and son that he absolutely would not want to be intubated and placed on ventilator for even brief trial of time to recover. We talked about transition to comfort measures, including that he could potentially die today if we focused strictly on his comfort. He desires comfort measures only, wife and son confirm this.    Code status: Full DNR  Disposition: In-patient comfort care   Time spent for the meeting: 15 minutes  Maryjane Hurter 06/17/2022, 4:12 PM

## 2022-06-17 NOTE — Progress Notes (Signed)
PROGRESS NOTE    Henry Sheppard  TTS:[id number] DOB: 02-27-1966 DOA: 06/17/2022 PCP: Lilian Coma., MD   Brief Narrative: 56 year old with past medical history significant for anemia, pulmonary hypertension, CHF per lipidemia, status post aortic valve replacement x2, hypertension, CAD, Hodgkin lymphoma, COPD, chronic nightly oxygen who presents complaining of worsening shortness of breath that is started 2 days prior to admission.  He was found to have oxygen saturation 67 on room air and improved to 89 on 5 L.  He reports productive cough and headaches.  Patient admitted with acute on chronic hypoxic respiratory failure, multifactorial secondary to pneumonia, COPD exacerbation and or heart failure exacerbation.  Patient continues to require BiPAP, on Precedex drip, alternation with high flow nasal cannula FiO2 70% O2 flow rate 30.  Patient has received IV antibiotics, IV Lasix, IV steroids, he continued to be very tachypneic and uncomfortable.  Continued to have increased work of breathing.  Patient has repeatedly said that he does not want to be intubated.  Subsequently, patient reported that he wanted to proceed with full comfort care, wife and son wants to honor patient wishes.  Patient has been transitioned to comfort care.  Morphine drip has been started.    Assessment & Plan:   Principal Problem:   Acute on chronic respiratory failure with hypoxia (HCC) Active Problems:   Other secondary pulmonary hypertension (HCC)   HFrEF (heart failure with reduced ejection fraction) (HCC)   S/P AVR   Essential hypertension   Prosthetic aortic valve stenosis   Narcotic dependence, in remission ([place])   Hyperlipidemia, group A   Hodgkins lymphoma ([place])   Coronary artery disease involving native coronary artery of native heart without angina pectoris   Gastroesophageal reflux disease without esophagitis   Personal history of Hodgkin lymphoma   On home oxygen therapy   Type 2 diabetes  mellitus without complication, without long-term current use of insulin (HCC)   Hypothyroidism   COPD with acute exacerbation (HCC)   Respiratory failure (HCC)   Acute on chronic heart failure with preserved ejection fraction (HCC)   1-Acute on Chronic Hypoxic Respiratory Failure:  Acute systolic and Diastolic  heart failure exacerbation Pneumonia COPD  Adenovirus  -Patient presented with worsening shortness of breath, cough, leukocytosis, elevated BNP, chest x-ray patchy bilateral groundglass opacity consistent with pneumonia also degree of vascular congestion. He has bilateral crackles and positive  JVD. -Continue with IV ceftriaxone and azithromycin -Continue with scheduled nebulizer.  Continue with Candiss Norse -Cardiology  consulted. Increasing lasix, adding Zaroxolyn.  -ECHO ; EF 55 % regional wall motion abnormalities. Bioprosthetic valve present.  Continue with  IV steroids.  Chest x ray: Persistent BL airspace opacities, slightly improve RU lobe.  On BIPAP and precedex.  Alternating high flow oxygen. He does not want to be on life support.  Patient continued to have respiratory distress.  Patient voices that he will want to proceed with full comfort care.  Morphine drip started.  2-Hyperlipidemia: Continue with atorvastatin.  3-Hypertension;  Continue with losartan and Metoprolol.   S/P Aortic Valve replacement.   Anxiety/ Continue with Sertraline.   GERD; Continue with PPI.  Diabetes: SSI. Opioid use disorder in remission: Continue with Suboxone. History of Hodgkin lymphoma  Weight loss: HIV non reactive.  ,  TSH: 3.6.  Will need further evaluation by his PCP ? Pulmonary cachexia.   Estimated body mass index is 23 kg/m as calculated from the following:   Height as of this encounter: '5\' 11"'$  (1.803 m).  Weight as of this encounter: 74.8 kg.   DVT prophylaxis: Lovenox Code Status: Full code Family Communication: Care discussed with patient. Discussed with  Wife at bedside.  Disposition Plan:  Status is: Inpatient Remains inpatient appropriate because: admitted with respiratory failure    Consultants:  Cardiology Pulmonology   Procedures:  ECHO  Antimicrobials:    Subjective: Denies any distress, reports dyspnea.  Objective: Vitals:   06/17/22 0500 06/17/22 0600 06/17/22 0810 06/17/22 0846  BP: 134/69 (!) 152/82    Pulse: 63 65 (!) 57   Resp: '18 19 16   '$ Temp:      TempSrc:      SpO2: 95% 92% 95% 94%  Weight: 74.8 kg     Height:        Intake/Output Summary (Last 24 hours) at 06/17/2022 0847 Last data filed at 06/17/2022 0600 Gross per 24 hour  Intake 519.8 ml  Output 2200 ml  Net -1680.2 ml    Filed Weights   06/14/22 1045 06/16/22 0651 06/17/22 0500  Weight: 78 kg 74.1 kg 74.8 kg    Examination:  General exam: Cachectic, in distress Respiratory system: on BIPAP.  Cardiovascular system: S 1, S 2 RRR Gastrointestinal system: BS present, soft, nt Central nervous system: Alert   Data Reviewed: I have personally reviewed following labs and imaging studies  CBC: Recent Labs  Lab 05/29/2022 2040 06/14/22 0530 06/15/22 0303 06/16/22 0251 06/17/22 0301  WBC 14.1* 15.9* 17.0* 14.3* 12.8*  NEUTROABS 11.1*  --  13.5*  --  10.2*  HGB 14.1 14.0 15.0 16.2 15.5  HCT 44.3 44.0 47.2 50.1 50.5  MCV 90.6 90.9 90.8 90.1 93.7  PLT 221 218 223 270 476    Basic Metabolic Panel: Recent Labs  Lab 05/19/2022 0050 06/04/2022 2040 06/14/22 0530 06/15/22 0303 06/16/22 0251 06/17/22 0301  NA  --  133* 138 136 138 138  K  --  3.5 3.8 4.0 3.5 4.2  CL  --  96* 101 94* 92* 92*  CO2  --  '28 29 30 '$ 34* 34*  GLUCOSE  --  135* 129* 126* 138* 139*  BUN  --  '9 10 14 '$ 24* 34*  CREATININE  --  0.58* 0.53* 0.57* 0.79 0.70  CALCIUM  --  8.6* 9.2 8.8* 9.5 9.1  MG 2.0  --   --  1.9  --  2.2  PHOS  --   --   --  3.3  --   --     GFR: Estimated Creatinine Clearance: 110.4 mL/min (by C-G formula based on SCr of 0.7 mg/dL). Liver  Function Tests: Recent Labs  Lab 06/14/22 0530 06/15/22 0303  AST 26 29  ALT 13 13  ALKPHOS 107 98  BILITOT 0.9 1.1  PROT 7.5 7.5  ALBUMIN 3.4* 3.2*    No results for input(s): "LIPASE", "AMYLASE" in the last 168 hours. No results for input(s): "AMMONIA" in the last 168 hours. Coagulation Profile: Recent Labs  Lab 05/20/2022 2040  INR 1.1    Cardiac Enzymes: No results for input(s): "CKTOTAL", "CKMB", "CKMBINDEX", "TROPONINI" in the last 168 hours. BNP (last 3 results) No results for input(s): "PROBNP" in the last 8760 hours. HbA1C: No results for input(s): "HGBA1C" in the last 72 hours. CBG: Recent Labs  Lab 06/15/22 2121 06/16/22 0741 06/16/22 1233 06/16/22 1534 06/17/22 0730  GLUCAP 120* 171* 105* 125* 143*    Lipid Profile: Recent Labs    06/16/22 0251  CHOL 90  HDL 28*  LDLCALC 49  TRIG 66  CHOLHDL 3.2  LDLDIRECT 44    Thyroid Function Tests: No results for input(s): "TSH", "T4TOTAL", "FREET4", "T3FREE", "THYROIDAB" in the last 72 hours.  Anemia Panel: No results for input(s): "VITAMINB12", "FOLATE", "FERRITIN", "TIBC", "IRON", "RETICCTPCT" in the last 72 hours. Sepsis Labs: Recent Labs  Lab 06/14/2022 2040 06/14/22 1236 06/15/22 0303 06/16/22 0251  PROCALCITON  --  <0.10 0.15 0.20  LATICACIDVEN 1.1  --   --   --      Recent Results (from the past 240 hour(s))  Culture, blood (routine x 2)     Status: None (Preliminary result)   Collection Time: 06/02/2022  8:30 PM   Specimen: BLOOD  Result Value Ref Range Status   Specimen Description   Final    BLOOD RIGHT ANTECUBITAL Performed at [facility], [place] [address], [place], [place] [address]    Special Requests   Final    BOTTLES DRAWN AEROBIC AND ANAEROBIC Blood Culture adequate volume Performed at [facility] [address] [place], [place] [address]    Culture   Final    NO GROWTH 3 DAYS Performed at [facility], [place] [address],  [place], [place] [address]    Report Status PENDING  Incomplete  Culture, blood (routine x 2)     Status: None (Preliminary result)   Collection Time: 06/10/2022  8:40 PM   Specimen: BLOOD  Result Value Ref Range Status   Specimen Description   Final    BLOOD BLOOD RIGHT ARM Performed at [place] [facility] [address], [place], [place] [address]    Special Requests   Final    BOTTLES DRAWN AEROBIC AND ANAEROBIC Blood Culture adequate volume Performed at [place] [address] [place], [place] [address]    Culture   Final    NO GROWTH 3 DAYS Performed at [facility], [place] [address] [place], [place] [address]    Report Status PENDING  Incomplete  SARS Coronavirus 2 by RT PCR (hospital order, performed in [facility] hospital lab) *cepheid single result test* Anterior Nasal Swab     Status: None   Collection Time: 05/20/2022  8:40 PM   Specimen: Anterior Nasal Swab  Result Value Ref Range Status   SARS Coronavirus 2 by RT PCR NEGATIVE NEGATIVE Final    Comment: (NOTE) SARS-CoV-2 target nucleic acids are NOT DETECTED.  The SARS-CoV-2 RNA is generally detectable in upper and lower respiratory specimens during the acute phase of infection. The lowest concentration of SARS-CoV-2 viral copies this assay can detect is 250 copies / mL. A negative result does not preclude SARS-CoV-2 infection and should not be used as the sole basis for treatment or other patient management decisions.  A negative result may occur with improper specimen collection / handling, submission of specimen other than nasopharyngeal swab, presence of viral mutation(s) within the areas targeted by this assay, and inadequate number of viral copies (<250 copies / mL). A negative result must be combined with clinical observations, patient history, and epidemiological information.  Fact Sheet for Patients:   https://www.patel.info/  Fact Sheet for Healthcare  Providers: https://hall.com/  This test is not yet approved or  cleared by the [place] FDA and has been authorized for detection and/or diagnosis of SARS-CoV-2 by FDA under an Emergency Use Authorization (EUA).  This EUA will remain in effect (meaning this test can be used) for the duration of the COVID-19 declaration under Section  564(b)(1) of the Act, 21 U.S.C. section 360bbb-3(b)(1), unless the authorization is terminated or revoked sooner.  Performed at [facility], [place] [address], [place], [place] [address]   Respiratory (~20 pathogens) panel by PCR     Status: Abnormal   Collection Time: 06/14/22  8:49 AM   Specimen: Nasopharyngeal Swab; Respiratory  Result Value Ref Range Status   Adenovirus DETECTED (A) NOT DETECTED Final   Coronavirus 229E NOT DETECTED NOT DETECTED Final    Comment: (NOTE) The Coronavirus on the Respiratory Panel, DOES NOT test for the novel  Coronavirus (2019 nCoV)    Coronavirus HKU1 NOT DETECTED NOT DETECTED Final   Coronavirus NL63 NOT DETECTED NOT DETECTED Final   Coronavirus OC43 NOT DETECTED NOT DETECTED Final   Metapneumovirus NOT DETECTED NOT DETECTED Final   Rhinovirus / Enterovirus NOT DETECTED NOT DETECTED Final   Influenza A NOT DETECTED NOT DETECTED Final   Influenza B NOT DETECTED NOT DETECTED Final   Parainfluenza Virus 1 NOT DETECTED NOT DETECTED Final   Parainfluenza Virus 2 NOT DETECTED NOT DETECTED Final   Parainfluenza Virus 3 NOT DETECTED NOT DETECTED Final   Parainfluenza Virus 4 NOT DETECTED NOT DETECTED Final   Respiratory Syncytial Virus NOT DETECTED NOT DETECTED Final   Bordetella pertussis NOT DETECTED NOT DETECTED Final   Bordetella Parapertussis NOT DETECTED NOT DETECTED Final   Chlamydophila pneumoniae NOT DETECTED NOT DETECTED Final   Mycoplasma pneumoniae NOT DETECTED NOT DETECTED Final    Comment: Performed at [facility], [address] [place], [place]  [address]  MRSA Next Gen by PCR, Nasal     Status: None   Collection Time: 06/14/22 10:44 AM   Specimen: Nasal Mucosa; Nasal Swab  Result Value Ref Range Status   MRSA by PCR Next Gen NOT DETECTED NOT DETECTED Final    Comment: (NOTE) The GeneXpert MRSA Assay (FDA approved for NASAL specimens only), is one component of a comprehensive MRSA colonization surveillance program. It is not intended to diagnose MRSA infection nor to guide or monitor treatment for MRSA infections. Test performance is not FDA approved in patients less than 28 years old. Performed at [facility], [place] [address] [place], [place] [address]          Radiology Studies: DG CHEST PORT 1 VIEW  Result Date: 06/15/2022 CLINICAL DATA:  Skin fold.  Assess for pneumothorax EXAM: PORTABLE CHEST 1 VIEW COMPARISON:  Same-day radiograph at 0900 hours FINDINGS: Loop recorder overlies the left chest. Stable heart size. Bilateral airspace opacities. Aeration within the right upper lobe has slightly improved compared to the recent prior exam. Previously seen skin fold is no longer evident. No evidence of pneumothorax. IMPRESSION: 1. No evidence of pneumothorax. 2. Persistent bilateral airspace opacities, slightly improved in the right upper lobe compared to the recent prior exam. Electronically Signed   By: Davina Poke D.O.   On: 06/15/2022 11:14   DG CHEST PORT 1 VIEW  Result Date: 06/15/2022 CLINICAL DATA:  Respiratory failure EXAM: PORTABLE CHEST 1 VIEW COMPARISON:  05/21/2022 FINDINGS: Loop recorder overlies the left chest. Stable heart size. Significant interval worsening of bilateral airspace opacities, most confluent within the right upper lobe. Vertically oriented linear marking at the periphery of the left hemithorax which appears to have lung markings extending peripherally, favored to represent a skin fold. No right-sided pneumothorax. Probable small bilateral pleural effusions. IMPRESSION: 1.  Significant interval worsening of bilateral airspace opacities, most confluent within the right upper lobe.  2. Probable skin fold overlying the periphery of the left chest. Recommend repeat upright chest radiograph to exclude the possibility of a small left pneumothorax. These results will be called to the ordering clinician or representative by the Radiologist Assistant, and communication documented in the PACS or [employer]. Electronically Signed   By: Davina Poke D.O.   On: 06/15/2022 10:01        Scheduled Meds:  albuterol  2.5 mg Nebulization TID   arformoterol  15 mcg Nebulization BID   aspirin EC  81 mg Oral Daily   atorvastatin  20 mg Oral Daily   budesonide (PULMICORT) nebulizer solution  0.5 mg Nebulization BID   buprenorphine-naloxone  1 tablet Sublingual TID   Chlorhexidine Gluconate Cloth  6 each Topical Daily   dapagliflozin propanediol  10 mg Oral Daily   enoxaparin (LOVENOX) injection  40 mg Subcutaneous Q24H   furosemide  80 mg Intravenous Q12H   insulin aspart  0-9 Units Subcutaneous TID WC   levothyroxine  175 mcg Oral Q0600   losartan  25 mg Oral Daily   methylPREDNISolone (SOLU-MEDROL) injection  40 mg Intravenous Daily   metolazone  2.5 mg Oral Daily   metoprolol tartrate  50 mg Oral TID   pantoprazole  40 mg Oral BID   potassium chloride  40 mEq Oral Once   revefenacin  175 mcg Nebulization Daily   sertraline  200 mg Oral QHS   sodium chloride flush  3 mL Intravenous Q12H   Continuous Infusions:  azithromycin Stopped (06/17/22 0014)   cefTRIAXone (ROCEPHIN)  IV Stopped (06/16/22 2302)   dexmedetomidine (PRECEDEX) IV infusion 0.6 mcg/kg/hr (06/17/22 0156)     LOS: 4 days    Time spent: 35 minutes.     Elmarie Shiley, MD [facility]   If 7PM-7AM, please contact night-coverage [email or web address]  06/17/2022, 8:47 AM

## 2022-06-17 NOTE — Progress Notes (Signed)
Subjective:  Continues to have shortness of breath Remains on BiPAP  Objective:  Vital Signs in the last 24 hours: Temp:  [97.7 F (36.5 C)-98.6 F (37 C)] 97.7 F (36.5 C) (07/31 0800) Pulse Rate:  [57-105] 89 (07/31 1100) Resp:  [15-26] 22 (07/31 1100) BP: (97-180)/(53-98) 180/93 (07/31 1100) SpO2:  [91 %-100 %] 95 % (07/31 1100) FiO2 (%):  [70 %-100 %] 70 % (07/31 1006) Weight:  [74.8 kg] 74.8 kg (07/31 0500)  Intake/Output from previous day: 07/30 0701 - 07/31 0700 In: 583.1 [I.V.:233.9; IV Piggyback:349.2] Out: 2200 [Urine:2200]  Physical Exam Vitals and nursing note reviewed.  Constitutional:      General: He is in acute distress.     Appearance: He is ill-appearing.  Neck:     Vascular: No JVD.  Cardiovascular:     Rate and Rhythm: Normal rate and regular rhythm.     Heart sounds: Normal heart sounds. No murmur heard. Pulmonary:     Effort: Respiratory distress present.     Breath sounds: Normal breath sounds. No wheezing or rales.     Comments: B/l coarse crackles Musculoskeletal:     Right lower leg: No edema.     Left lower leg: No edema.      Imaging/tests reviewed and independently interpreted:  Cardiac Studies:  Telemetry 06/17/2022: Sinus tachycardia, occasional PAC  EKG 06/17/2022: Sinus rhythm LBBB Occasional PAC  Echocardiogram 06/14/2022:  1. Left ventricular ejection fraction, by estimation, is 50 to 55%. The  left ventricle has low normal function. The left ventricle demonstrates  regional wall motion abnormalities (see scoring diagram/findings for  description). Left ventricular diastolic   parameters are indeterminate.   2. Right ventricular systolic function is normal. The right ventricular  size is normal.   3. The mitral valve is abnormal. Mild mitral valve regurgitation. Mild  mitral stenosis. The mean mitral valve gradient is 5.0 mmHg. Moderate to  severe mitral annular calcification.   4. The aortic valve has been  repaired/replaced. Aortic valve  regurgitation is not visualized. There is a 21 mm bioprosthetic valve  present in the aortic position. Procedure Date: 07/29/2019. Echo findings  are consistent with normal structure and  function of the aortic valve prosthesis.   5. The inferior vena cava is dilated in size with <50% respiratory  variability, suggesting right atrial pressure of 15 mmHg.   Comparison(s): A prior study was performed on 09/03/2019. LVEF has  improved from 40-45%. Tricuspid regurgitation not well appreciated on this  study.     Assessment & Recommendations:  56 year old Caucasian male status post bioproshtetic AVR (21 mm pericardial tissue valve), in 2011 in [place], now s/p redo sternotomy and Aortic root replacement using a 21 mm [employer] freestyle aortic bioprosthesis (07/2018) due to severe patient prosthesis mismatch, nonobstructive coronary artery disease, h/o Hodgkin's lymphoma in remission, admitted with acute hypoxic respiratory failure  Acute hypoxic respiratory failure: Primarily due to adenovirus pneumonia, with underlying COPD and HFpEF He is 6 L net negative., While BNP is elevated, this could lag behind clinical improvement. Clinically, I do not think he is in florid heart failure at this time. Continue management as per critical care. He is currently on IV lasix 80 mg bid with borderline low blood pressure and tachycardia. It would be reasonable to hold lasix today. Can be resumed at a lower dose of 40 mg bid, as and when needed.  I will follow him peripherally. Please call back if any questions.  Discussed interpretation of tests  and management recommendations with the primary team   Nigel Mormon, MD Pager: [phone] Office: [phone]

## 2022-06-17 NOTE — Progress Notes (Signed)
NAME:  Henry Sheppard, MRN:  [id number], DOB:  10-11-1966, LOS: 4 ADMISSION DATE:  06/01/2022, CONSULTATION DATE:  7/28 REFERRING MD:  Dr. Tyrell Antonio, CHIEF COMPLAINT:  SOB   History of Present Illness:  56 y/o M who presented to [facility] ER on 7/27 with reports of 2 days of progressive shortness of breath.    Initially there was some question of patient compliance with medications due to insurance issues but he reports that EMS misunderstood and that he has been taking his medications appropriately.  He reports he is not on chronic narcotics at this point.  States that he wears 3 L of oxygen at night for sleep.  He reports he has had at least 3 days of increasing shortness of breath, cough with yellow sputum production and 2 weeks of decreased activity tolerance.  He reports he continues to smoke 1 pack/day.  At home he noted his room air saturations dropped into the 60s with exertion.  He reports chills but no documented fever.  Initial ER evaluation notable for negative COVID screening.  RVP pending.  Initial labs notable for NA 133, K3.5, BUN 9, creatinine 0.58, BNP 436, troponin 6, WBC 14.1, hemoglobin 14.1, and platelets 221.  EKG notable for sinus rhythm with PAC and left bundle branch block.  Chest x-ray consistent with baseline emphysema, cardiomegaly with vascular congestion and patchy groundglass opacities.  Patient required BiPAP support on the a.m. of 7/28 for increased work of breathing.  He was given Lasix with at least 1.6 L urinary output in the emergency room.  PCCM consulted for pulmonary evaluation.  Pertinent  Medical History  COPD  Chronic Hypoxic Respiratory Failure - 2L QHS O2  Anxiety / Depression  Chronic Pain, Narcotic Dependence  HTN  HLD LBBB Murmur  Moderate to Severe Aortic Stenosis s/p AVR Arthritis   Significant Hospital Events: Including procedures, antibiotic start and stop dates in addition to other pertinent events   7/27 Admit per [facility] for SOB 7/28 PCCM  consulted for pulmonary evaluation  7/29 worsening respiratory distress, chest x-ray worse 7/30 starting Precedex, continue intermittent BiPAP.  Made DNR per patient preference.  Interim History / Subjective:   Diuresis with lasix IV 80 BID, metolazone appears to have been held.   Started on precedex, drowsy this morning but looks comfortable.   Objective   Blood pressure (!) 152/82, pulse 65, temperature 97.8 F (36.6 C), temperature source Axillary, resp. rate 19, height '5\' 11"'$  (1.803 m), weight 74.8 kg, SpO2 92 %.    FiO2 (%):  [70 %-100 %] 100 %   Intake/Output Summary (Last 24 hours) at 06/17/2022 0744 Last data filed at 06/17/2022 0600 Gross per 24 hour  Intake 519.8 ml  Output 2200 ml  Net -1680.2 ml   Filed Weights   06/14/22 1045 06/16/22 0651 06/17/22 0500  Weight: 78 kg 74.1 kg 74.8 kg    Examination: Blood pressure (!) 98/57, pulse (!) 56, temperature 97.8 F (36.6 C), temperature source Oral, resp. rate 17, height '5\' 11"'$  (1.803 m), weight 74.1 kg, SpO2 94 %. Gen:      Chronically ill appearing, very thin HEENT:  EOMI, sclera anicteric Neck:     No masses; no thyromegaly Lungs:    Diminished breath sounds.  Faint crackles bl. CV:        brady rr; no murmurs Abd:      Hypoactive bs, nontender, nondistended Ext:    No edema; adequate peripheral perfusion Skin:      Warm  and dry; no rash Neuro: drowsy but easily arousable, grossly nonfocal  Labs/imaging reviewed S Cr stable WBCs 12.8  No new imaging  Resolved Hospital Problem list     Assessment & Plan:  Acute on Chronic Hypoxic Respiratory Failure  COPD with Acute Exacerbation Community acquired PNA in setting of adenovirus infection - Continue supplemental oxygen - Ceftriaxone, azithromycin for superimposed bacterial community-acquired pneumonia, end date 8/1 - Continue Brovana, Pulmicort and Yupelri - Solumedrol 40 mg daily (IV while still needing intermittent NIV) - Pulmonary toilet  - Wean  precedex as able, consider scheduled klonopin if any trouble weaning over the course of the day  Acute on chronic diastolic heart failure  HTN, HLD, LBBB Hx AS s/p Tissue AVR - Cardiology on board - Diurese for net negative fluid balance  Anxiety  - trial of precedex as above  Hx Opioid Use Disorder  - Suboxone per [facility]   Weight Loss  ? Pulmonary cachexia   Hypothyroidism  - Per [facility]   DM II  - Per [facility]  Goals of care - DNR.  See separate note.  Best Practice (right click and "Reselect all SmartList Selections" daily)  Per [facility] / Primary SVC   Critical care time:   The patient is critically ill with multiple organ system failure and requires high complexity decision making for assessment and support, frequent evaluation and titration of therapies, advanced monitoring, review of radiographic studies and interpretation of complex data.   Critical Care Time devoted to patient care services, exclusive of separately billable procedures, described in this note is 35 minutes.   [facility] [place]   If no response to pager , please call [phone] until 7pm After 7:00 pm call Elink  [phone] 06/17/2022, 7:44 AM

## 2022-06-17 NOTE — Progress Notes (Signed)
  Transition of Care [facility]) Screening Note   Patient Details  Name: Henry Sheppard Date of Birth: 1966-08-20   [facility]) CM/SW Contact:    Lennart Pall, LCSW Phone Number: 06/17/2022, 11:21 AM    Transition of Care Department [facility]) has reviewed patient and no TOC needs have been identified at this time. We will continue to monitor patient advancement through interdisciplinary progression rounds. If new patient transition needs arise, please place a TOC consult. '

## 2022-06-18 LAB — CULTURE, BLOOD (ROUTINE X 2)
Culture: NO GROWTH
Culture: NO GROWTH
Special Requests: ADEQUATE
Special Requests: ADEQUATE

## 2022-06-18 MED ORDER — MORPHINE SULFATE (PF) 2 MG/ML IV SOLN
2.0000 mg | Freq: Once | INTRAVENOUS | Status: AC
  Administered 2022-06-18: 2 mg via INTRAVENOUS
  Filled 2022-06-18: qty 1

## 2022-06-18 MED ORDER — LORAZEPAM 2 MG/ML IJ SOLN
1.0000 mg | Freq: Once | INTRAMUSCULAR | Status: AC
  Administered 2022-06-18: 1 mg via INTRAVENOUS
  Filled 2022-06-18: qty 1

## 2022-06-18 MED ORDER — ORAL CARE MOUTH RINSE
15.0000 mL | OROMUCOSAL | Status: DC | PRN
Start: 2022-06-18 — End: 2022-06-18

## 2022-06-18 DEATH — deceased

## 2022-07-19 NOTE — Death Summary Note (Signed)
DEATH SUMMARY   Patient Details  Name: Henry Sheppard MRN: [id number] DOB: Aug 08, 1966 CLE:XNTZ, Alexis Goodell., MD Admission/Discharge Information   Admit Date:  06/19/2022  Date of Death: Date of Death: Jun 24, 2022  Time of Death: Time of Death: 0645  Length of Stay: 5   Principle Cause of death: Acute on chronic  hypoxic hypercapnic  respiratory secondary to Community Acquired PNA in setting of  Adenovirus,   Hospital Diagnoses: Principal Problem:   Acute on chronic respiratory failure with hypoxia ([place]) Active Problems:   Other secondary pulmonary hypertension (HCC)   HFrEF (heart failure with reduced ejection fraction) (HCC)   S/P AVR   Essential hypertension   Prosthetic aortic valve stenosis   Narcotic dependence, in remission ([place])   Hyperlipidemia, group A   Hodgkins lymphoma ([place])   Coronary artery disease involving native coronary artery of native heart without angina pectoris   Gastroesophageal reflux disease without esophagitis   Personal history of Hodgkin lymphoma   On home oxygen therapy   Type 2 diabetes mellitus without complication, without long-term current use of insulin (HCC)   Hypothyroidism   COPD with acute exacerbation (HCC)   Respiratory failure ([place])   Acute on chronic heart failure with preserved ejection fraction (HCC)   Acute systolic and Diastolic  heart failure exacerbation   Pneumonia   Acute COPD    Adenovirus   Hospital Course: 56 year old with past medical history significant for anemia, pulmonary hypertension, CHF per lipidemia, status post aortic valve replacement x2, hypertension, CAD, Hodgkin lymphoma, COPD, chronic nightly oxygen who presents complaining of worsening shortness of breath that is started 2 days prior to admission.  He was found to have oxygen saturation 67 on room air and improved to 89 on 5 L.  He reports productive cough and headaches.   Patient admitted with acute on chronic hypoxic respiratory failure, multifactorial  secondary to pneumonia, COPD exacerbation and or heart failure exacerbation.   Patient continues to require BiPAP, on Precedex drip, alternation with high flow nasal cannula FiO2 70% O2 flow rate 30.  Patient has received IV antibiotics, IV Lasix, IV steroids, he continued to be very tachypneic and uncomfortable.  Continued to have increased work of breathing.  Patient has repeatedly said that he does not want to be intubated.  Subsequently, patient reported that he wanted to proceed with full comfort care, wife and son wants to honor patient wishes.   Patient has been transitioned to comfort care.  he was started on Morphine gtt. Patient expired 06/25/2023 at 6;45 am. Family was informed.   Assessment and Plan:   1-Acute on Chronic Hypoxic Respiratory Failure:  Acute systolic and Diastolic  heart failure exacerbation Pneumonia COPD  Adenovirus  -Patient presented with worsening shortness of breath, cough, leukocytosis, elevated BNP, chest x-ray patchy bilateral groundglass opacity consistent with pneumonia also degree of vascular congestion. He has bilateral crackles and positive  JVD. -Treated  with IV ceftriaxone and azithromycin -Treated  with scheduled nebulizer.  Continue with Candiss Norse -Cardiology  consulted. Increasing lasix, adding Zaroxolyn.  -ECHO ; EF 55 % regional wall motion abnormalities. Bioprosthetic valve present.  Treated  with  IV steroids.  Chest x ray: Persistent BL airspace opacities, slightly improve RU lobe.  On BIPAP and precedex.  Alternating high flow oxygen. He does not want to be on life support.  Patient continued to have respiratory distress.  Patient voices that he will want to proceed with full comfort care.  Morphine drip started. He  transition to comfort care.    2-Hyperlipidemia: was on  atorvastatin.   3-Hypertension;  Was on  losartan and Metoprolol.    S/P Aortic Valve replacement.    Anxiety/ Continue with Sertraline.    GERD; Continue with  PPI.  Diabetes: SSI. Opioid use disorder in remission: Continue with Suboxone. History of Hodgkin lymphoma   Weight loss: HIV non reactive.  ,  TSH: 3.6.  Will need further evaluation by his PCP ? Pulmonary cachexia.    Estimated body mass index is 23 kg/m as calculated from the following:   Height as of this encounter: '5\' 11"'$  (1.803 m).   Weight as of this encounter: 74.8 kg.            Procedures:   Consultations: CCM. Cardiology   The results of significant diagnostics from this hospitalization (including imaging, microbiology, ancillary and laboratory) are listed below for reference.   Significant Diagnostic Studies: DG CHEST PORT 1 VIEW  Result Date: 06/17/2022 CLINICAL DATA:  Respiratory distress. EXAM: PORTABLE CHEST 1 VIEW COMPARISON:  Radiograph 06/15/2022.  CT 02/19/2022. FINDINGS: 1010 hours. Two views obtained. The heart size and mediastinal contours are stable. Allowing for slight positional differences, no significant change in the right greater than left bilateral airspace opacities. No evidence of pneumothorax or significant pleural effusion. Loop recorder overlies the left chest, and the patient is status post right clavicular ORIF. Multiple surgical clips are present in the superior mediastinum. IMPRESSION: No significant change in right-greater-than-left airspace opacities which remain suspicious for multilobar pneumonia. Electronically Signed   By: Richardean Sale M.D.   On: 06/17/2022 10:36   ECHOCARDIOGRAM COMPLETE  Result Date: 06/15/2022    ECHOCARDIOGRAM REPORT   Patient Name:   Henry Sheppard Date of Exam: 06/14/2022 Medical Rec #:  [id number]         Height:       71.0 in Accession #:    [id number]        Weight:       172.0 lb Date of Birth:  [birth year]         BSA:          1.977 m Patient Age:    56 years          BP:           125/73 mmHg Patient Gender: M                 HR:           73 bpm. Exam Location:  Inpatient Procedure: 2D Echo, Cardiac  Doppler and Color Doppler Indications:     CHF-Acute Systolic W29.56  History:         Patient has prior history of Echocardiogram examinations, most                  recent 09/05/2019. COPD, Arrythmias:LBBB,                  Signs/Symptoms:Murmur; Risk Factors:Dyslipidemia and                  Hypertension.                  Aortic Valve: 21 mm bioprosthetic valve is present in the                  aortic position. Procedure Date: 07/29/2019.  Sonographer:     Darlina Sicilian RDCS Referring Phys:  [id number] Candace Gallus MELVIN Diagnosing Phys: Joya Gaskins  Patwardhan MD  Sonographer Comments: Imaging limited due to BiPap. IMPRESSIONS  1. Left ventricular ejection fraction, by estimation, is 50 to 55%. The left ventricle has low normal function. The left ventricle demonstrates regional wall motion abnormalities (see scoring diagram/findings for description). Left ventricular diastolic  parameters are indeterminate.  2. Right ventricular systolic function is normal. The right ventricular size is normal.  3. The mitral valve is abnormal. Mild mitral valve regurgitation. Mild mitral stenosis. The mean mitral valve gradient is 5.0 mmHg. Moderate to severe mitral annular calcification.  4. The aortic valve has been repaired/replaced. Aortic valve regurgitation is not visualized. There is a 21 mm bioprosthetic valve present in the aortic position. Procedure Date: 07/29/2019. Echo findings are consistent with normal structure and function of the aortic valve prosthesis.  5. The inferior vena cava is dilated in size with <50% respiratory variability, suggesting right atrial pressure of 15 mmHg. Comparison(s): A prior study was performed on 09/03/2019. LVEF has improved from 40-45%. Tricuspid regurgitation not well appreciated on this study. FINDINGS  Left Ventricle: Left ventricular ejection fraction, by estimation, is 50 to 55%. The left ventricle has low normal function. The left ventricle demonstrates regional wall motion  abnormalities. The left ventricular internal cavity size was normal in size. There is no left ventricular hypertrophy. Abnormal (paradoxical) septal motion consistent with post-operative status. Left ventricular diastolic parameters are indeterminate. Right Ventricle: The right ventricular size is normal. No increase in right ventricular wall thickness. Right ventricular systolic function is normal. Left Atrium: Left atrial size was normal in size. Right Atrium: Right atrial size was normal in size. Pericardium: There is no evidence of pericardial effusion. Mitral Valve: The mitral valve is abnormal. Moderate to severe mitral annular calcification. Mild mitral valve regurgitation. Mild mitral valve stenosis. MV peak gradient, 9.4 mmHg. The mean mitral valve gradient is 5.0 mmHg. Tricuspid Valve: The tricuspid valve is normal in structure. Tricuspid valve regurgitation is not demonstrated. No evidence of tricuspid stenosis. Aortic Valve: Mean PG 13-15 mmHg is acceptable. The aortic valve has been repaired/replaced. Aortic valve regurgitation is not visualized. Aortic valve mean gradient measures 12.3 mmHg. Aortic valve peak gradient measures 23.4 mmHg. Aortic valve area, by  VTI measures 1.82 cm. There is a 21 mm bioprosthetic valve present in the aortic position. Procedure Date: 07/29/2019. Echo findings are consistent with normal structure and function of the aortic valve prosthesis. Pulmonic Valve: The pulmonic valve was grossly normal. Pulmonic valve regurgitation is trivial. Aorta: The aortic root is normal in size and structure. Venous: The inferior vena cava is dilated in size with less than 50% respiratory variability, suggesting right atrial pressure of 15 mmHg. IAS/Shunts: The interatrial septum was not assessed.  LEFT VENTRICLE PLAX 2D LVIDd:         5.60 cm LVIDs:         4.00 cm LV PW:         0.90 cm LV IVS:        0.90 cm LVOT diam:     1.90 cm LV SV:         74 LV SV Index:   38 LVOT Area:     2.84  cm  LV Volumes (MOD) LV vol d, MOD A2C: 96.4 ml LV vol d, MOD A4C: 128.0 ml LV vol s, MOD A2C: 58.2 ml LV vol s, MOD A4C: 54.4 ml LV SV MOD A2C:     38.2 ml LV SV MOD A4C:     128.0 ml LV SV  MOD BP:      56.9 ml RIGHT VENTRICLE TAPSE (M-mode): 1.9 cm LEFT ATRIUM             Index LA diam:        4.40 cm 2.23 cm/m LA Vol (A2C):   53.2 ml 26.88 ml/m LA Vol (A4C):   46.9 ml 23.72 ml/m LA Biplane Vol: 44.8 ml 22.66 ml/m  AORTIC VALVE AV Area (Vmax):    1.67 cm AV Area (Vmean):   1.76 cm AV Area (VTI):     1.82 cm AV Vmax:           241.67 cm/s AV Vmean:          158.333 cm/s AV VTI:            0.409 m AV Peak Grad:      23.4 mmHg AV Mean Grad:      12.3 mmHg LVOT Vmax:         142.00 cm/s LVOT Vmean:        98.300 cm/s LVOT VTI:          0.262 m LVOT/AV VTI ratio: 0.64  AORTA Ao Asc diam: 3.00 cm MITRAL VALVE MV Area (PHT): 2.34 cm     SHUNTS MV Area VTI:   1.71 cm     Systemic VTI:  0.26 m MV Peak grad:  9.4 mmHg     Systemic Diam: 1.90 cm MV Mean grad:  5.0 mmHg MV Vmax:       1.54 m/s MV Vmean:      103.2 cm/s MV Decel Time: 324 msec MV E velocity: 144.00 cm/s MV A velocity: 110.00 cm/s MV E/A ratio:  1.31 Manish Patwardhan MD Electronically signed by Vernell Leep MD Signature Date/Time: 06/14/2022/2:00:25 PM    Final (Updated)    DG CHEST PORT 1 VIEW  Result Date: 06/15/2022 CLINICAL DATA:  Skin fold.  Assess for pneumothorax EXAM: PORTABLE CHEST 1 VIEW COMPARISON:  Same-day radiograph at 0900 hours FINDINGS: Loop recorder overlies the left chest. Stable heart size. Bilateral airspace opacities. Aeration within the right upper lobe has slightly improved compared to the recent prior exam. Previously seen skin fold is no longer evident. No evidence of pneumothorax. IMPRESSION: 1. No evidence of pneumothorax. 2. Persistent bilateral airspace opacities, slightly improved in the right upper lobe compared to the recent prior exam. Electronically Signed   By: Davina Poke D.O.   On: 06/15/2022 11:14    DG CHEST PORT 1 VIEW  Result Date: 06/15/2022 CLINICAL DATA:  Respiratory failure EXAM: PORTABLE CHEST 1 VIEW COMPARISON:  05/31/2022 FINDINGS: Loop recorder overlies the left chest. Stable heart size. Significant interval worsening of bilateral airspace opacities, most confluent within the right upper lobe. Vertically oriented linear marking at the periphery of the left hemithorax which appears to have lung markings extending peripherally, favored to represent a skin fold. No right-sided pneumothorax. Probable small bilateral pleural effusions. IMPRESSION: 1. Significant interval worsening of bilateral airspace opacities, most confluent within the right upper lobe. 2. Probable skin fold overlying the periphery of the left chest. Recommend repeat upright chest radiograph to exclude the possibility of a small left pneumothorax. These results will be called to the ordering clinician or representative by the Radiologist Assistant, and communication documented in the PACS or [employer]. Electronically Signed   By: Davina Poke D.O.   On: 06/15/2022 10:01   DG Chest Port 1 View  Result Date: 05/21/2022 CLINICAL DATA:  Short of breath EXAM: PORTABLE  CHEST 1 VIEW COMPARISON:  08/27/2021, chest CT 02/19/2022 FINDINGS: Electronic device over left chest. Cardiomegaly with vascular congestion. Possible tiny pleural effusions. Patchy bilateral consolidations and ground-glass opacities. Aortic atherosclerosis. Surgical plate and fixating screws in the right clavicle IMPRESSION: 1. Cardiomegaly with vascular congestion and possible tiny pleural effusions. 2. Emphysema. Patchy bilateral ground-glass opacities and consolidations are suspect for bilateral pneumonia Electronically Signed   By: Donavan Foil M.D.   On: 05/24/2022 20:40    Microbiology: Recent Results (from the past 240 hour(s))  Culture, blood (routine x 2)     Status: None (Preliminary result)   Collection Time: 05/23/2022  8:30 PM   Specimen:  BLOOD  Result Value Ref Range Status   Specimen Description   Final    BLOOD RIGHT ANTECUBITAL Performed at [facility] [address] [place], [place] [address]    Special Requests   Final    BOTTLES DRAWN AEROBIC AND ANAEROBIC Blood Culture adequate volume Performed at [facility] [address] [place], [place] [address]    Culture   Final    NO GROWTH 4 DAYS Performed at [facility], [place] [address], [place], [place] [address]    Report Status PENDING  Incomplete  Culture, blood (routine x 2)     Status: None (Preliminary result)   Collection Time: 05/25/2022  8:40 PM   Specimen: BLOOD  Result Value Ref Range Status   Specimen Description   Final    BLOOD BLOOD RIGHT ARM Performed at [facility] [address] [place], [place] [address]    Special Requests   Final    BOTTLES DRAWN AEROBIC AND ANAEROBIC Blood Culture adequate volume Performed at [facility] [address] [place], [place] [address]    Culture   Final    NO GROWTH 4 DAYS Performed at [facility], [place] [address] [place], [place] [address]    Report Status PENDING  Incomplete  SARS Coronavirus 2 by RT PCR (hospital order, performed in [facility] hospital lab) *cepheid single result test* Anterior Nasal Swab     Status: None   Collection Time: 06/01/2022  8:40 PM   Specimen: Anterior Nasal Swab  Result Value Ref Range Status   SARS Coronavirus 2 by RT PCR NEGATIVE NEGATIVE Final    Comment: (NOTE) SARS-CoV-2 target nucleic acids are NOT DETECTED.  The SARS-CoV-2 RNA is generally detectable in upper and lower respiratory specimens during the acute phase of infection. The lowest concentration of SARS-CoV-2 viral copies this assay can detect is 250 copies / mL. A negative result does not preclude SARS-CoV-2 infection and should not be used as the sole basis for treatment or other patient management decisions.  A  negative result may occur with improper specimen collection / handling, submission of specimen other than nasopharyngeal swab, presence of viral mutation(s) within the areas targeted by this assay, and inadequate number of viral copies (<250 copies / mL). A negative result must be combined with clinical observations, patient history, and epidemiological information.  Fact Sheet for Patients:   https://www.patel.info/  Fact Sheet for Healthcare Providers: https://hall.com/  This test is not yet approved or  cleared by the [place] FDA and has been authorized for detection and/or diagnosis of SARS-CoV-2 by FDA under an Emergency Use Authorization (EUA).  This EUA will remain in effect (meaning this test can be used) for the duration of the COVID-19 declaration under Section 564(b)(1) of  the Act, 21 U.S.C. section 360bbb-3(b)(1), unless the authorization is terminated or revoked sooner.  Performed at [facility], [place] [address] [place], [place] [address]   Respiratory (~20 pathogens) panel by PCR     Status: Abnormal   Collection Time: 06/14/22  8:49 AM   Specimen: Nasopharyngeal Swab; Respiratory  Result Value Ref Range Status   Adenovirus DETECTED (A) NOT DETECTED Final   Coronavirus 229E NOT DETECTED NOT DETECTED Final    Comment: (NOTE) The Coronavirus on the Respiratory Panel, DOES NOT test for the novel  Coronavirus (2019 nCoV)    Coronavirus HKU1 NOT DETECTED NOT DETECTED Final   Coronavirus NL63 NOT DETECTED NOT DETECTED Final   Coronavirus OC43 NOT DETECTED NOT DETECTED Final   Metapneumovirus NOT DETECTED NOT DETECTED Final   Rhinovirus / Enterovirus NOT DETECTED NOT DETECTED Final   Influenza A NOT DETECTED NOT DETECTED Final   Influenza B NOT DETECTED NOT DETECTED Final   Parainfluenza Virus 1 NOT DETECTED NOT DETECTED Final   Parainfluenza Virus 2 NOT DETECTED NOT DETECTED Final   Parainfluenza  Virus 3 NOT DETECTED NOT DETECTED Final   Parainfluenza Virus 4 NOT DETECTED NOT DETECTED Final   Respiratory Syncytial Virus NOT DETECTED NOT DETECTED Final   Bordetella pertussis NOT DETECTED NOT DETECTED Final   Bordetella Parapertussis NOT DETECTED NOT DETECTED Final   Chlamydophila pneumoniae NOT DETECTED NOT DETECTED Final   Mycoplasma pneumoniae NOT DETECTED NOT DETECTED Final    Comment: Performed at [facility], [address] [place], [place] [address]  MRSA Next Gen by PCR, Nasal     Status: None   Collection Time: 06/14/22 10:44 AM   Specimen: Nasal Mucosa; Nasal Swab  Result Value Ref Range Status   MRSA by PCR Next Gen NOT DETECTED NOT DETECTED Final    Comment: (NOTE) The GeneXpert MRSA Assay (FDA approved for NASAL specimens only), is one component of a comprehensive MRSA colonization surveillance program. It is not intended to diagnose MRSA infection nor to guide or monitor treatment for MRSA infections. Test performance is not FDA approved in patients less than 16 years old. Performed at [facility], [place] [address], [place], [place] [address]     Time spent: 35 minutes  Signed: Elmarie Shiley, MD Jun 24, 2022

## 2022-07-19 DEATH — deceased

## 2023-01-23 IMAGING — CR DG CHEST 2V
2 series · 2 of 2 positions shown · non-contrast
Comparison: 08/08/2021

CLINICAL DATA: Sternal pain

EXAM:
CHEST - 2 VIEW

[w chest pa]
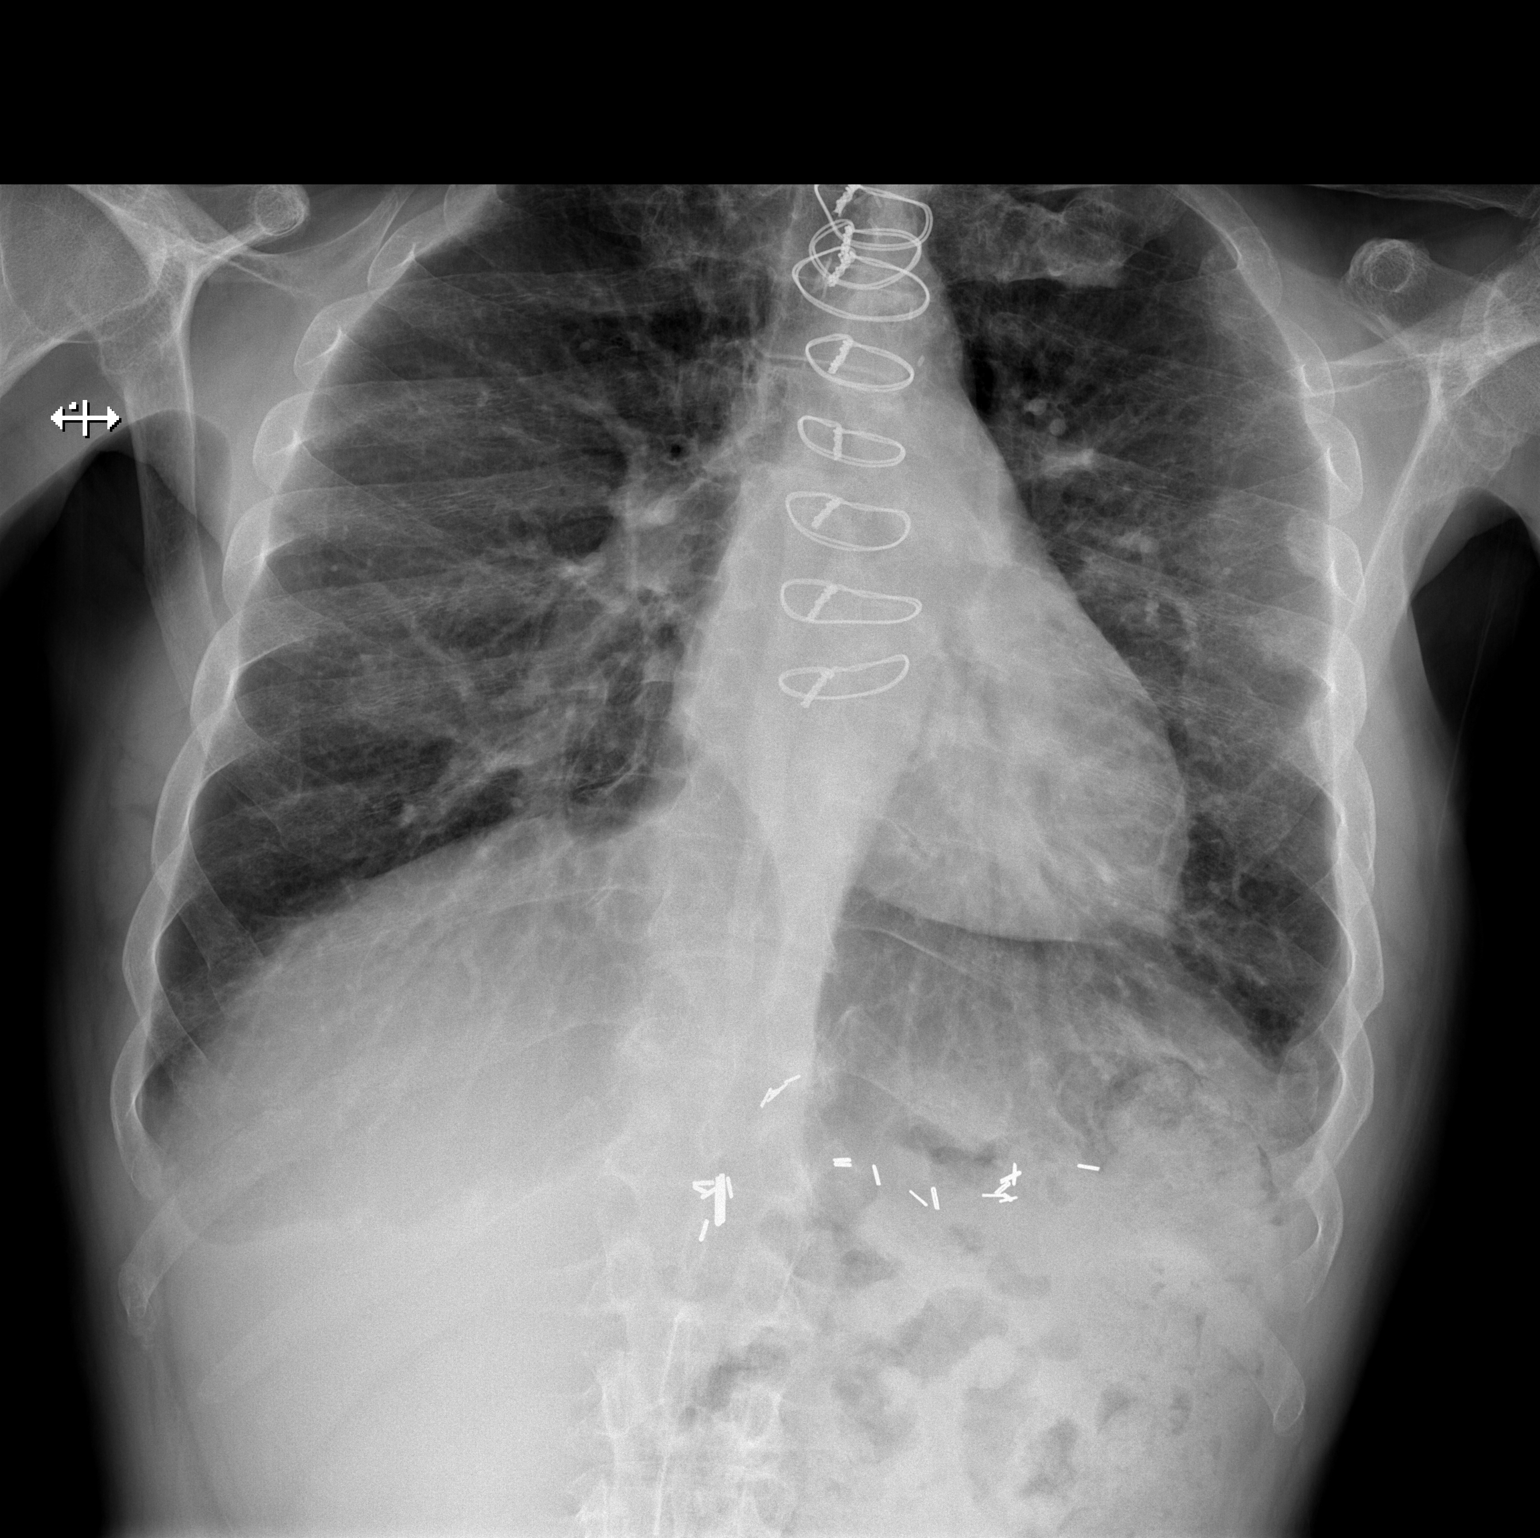

[w chest lat]
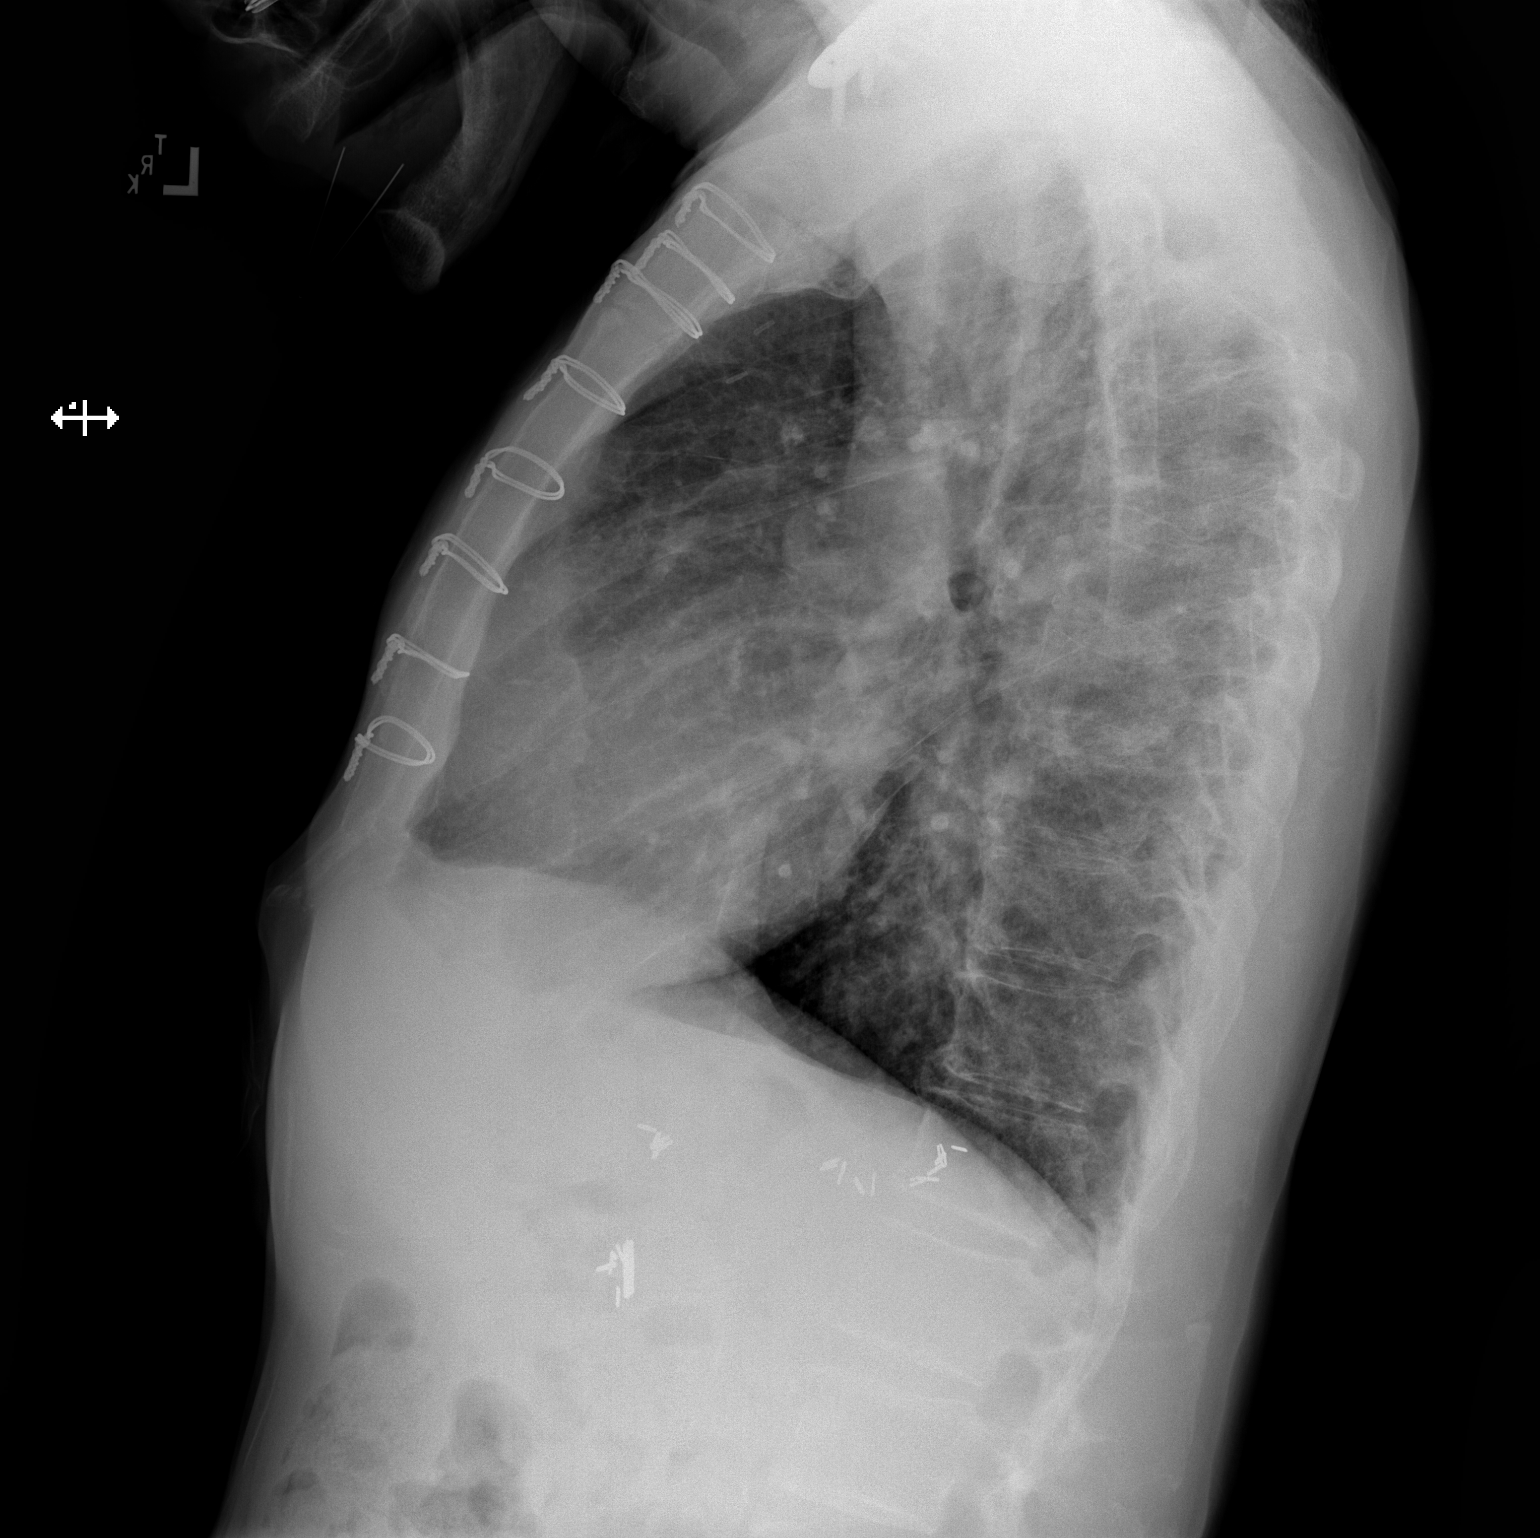

[2 of 2 positions shown; findings below may reference images not displayed]

FINDINGS: Chronic interstitial changes. No new consolidation. No pleural
effusion. Stable cardiomediastinal contours. Sternotomy wires and
surgical clips. Right clavicle fixation.
IMPRESSION: No acute process in the chest.

## 2023-01-24 IMAGING — DX DG CHEST 1V PORT
1 series · 1 of 1 positions shown · non-contrast
Comparison: 08/16/2021.

CLINICAL DATA: S/P AVR HYC.F (KTH-3D-CM)

EXAM:
PORTABLE CHEST 1 VIEW

[chest ap]
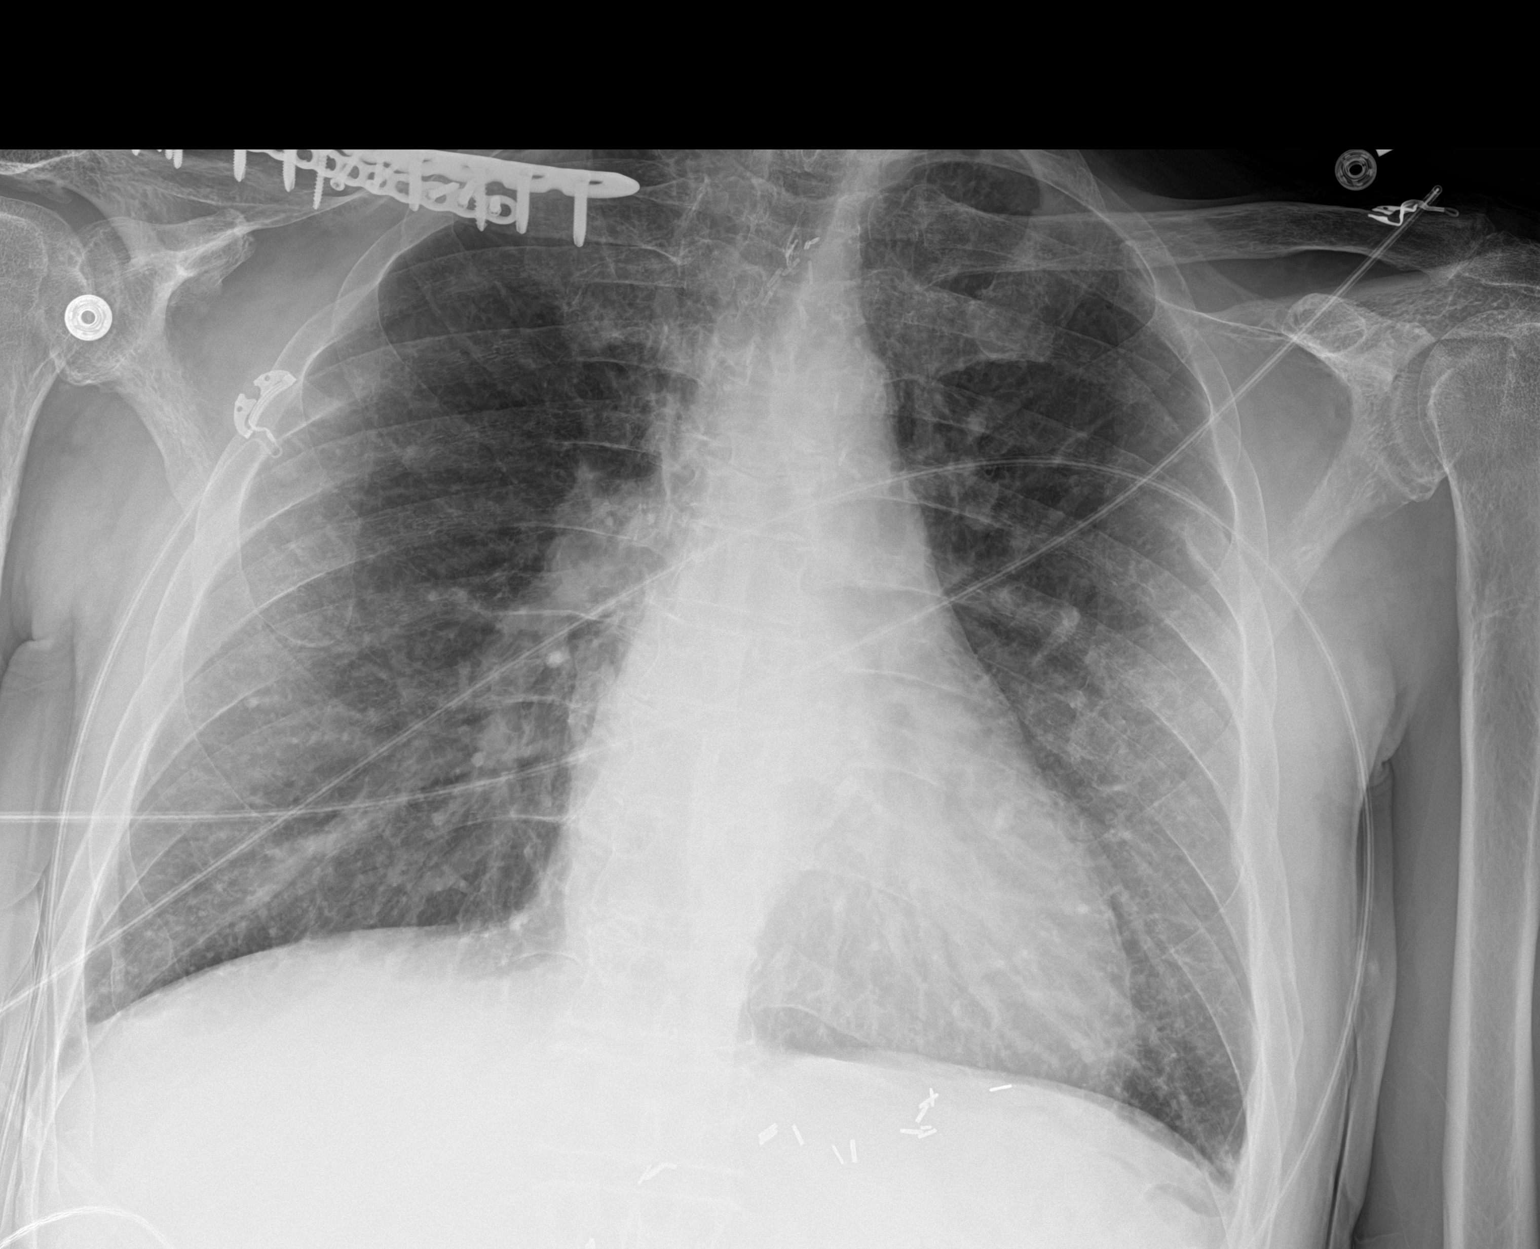

[1 of 1 positions shown; findings below may reference images not displayed]

FINDINGS: Similar chronic interstitial changes. No new consolidation. No
visible pleural effusions or pneumothorax. Cardiomediastinal
silhouette is similar. Right clavicular fixation. Surgical clips in
the region of the left upper quadrant.
IMPRESSION: No evidence of acute cardiopulmonary disease.
